# Patient Record
Sex: Male | Born: 1960 | Race: White | Hispanic: No | Marital: Married | Smoking: Current every day smoker
Health system: Southern US, Community
[De-identification: ages and names within clinical notes are randomized; demographics above are authoritative.]

## PROBLEM LIST (undated history)

## (undated) DIAGNOSIS — K5792 Diverticulitis of intestine, part unspecified, without perforation or abscess without bleeding: Secondary | ICD-10-CM

## (undated) DIAGNOSIS — K219 Gastro-esophageal reflux disease without esophagitis: Secondary | ICD-10-CM

## (undated) DIAGNOSIS — F17210 Nicotine dependence, cigarettes, uncomplicated: Secondary | ICD-10-CM

## (undated) DIAGNOSIS — J439 Emphysema, unspecified: Secondary | ICD-10-CM

## (undated) DIAGNOSIS — Z9889 Other specified postprocedural states: Secondary | ICD-10-CM

## (undated) DIAGNOSIS — N289 Disorder of kidney and ureter, unspecified: Secondary | ICD-10-CM

## (undated) DIAGNOSIS — M519 Unspecified thoracic, thoracolumbar and lumbosacral intervertebral disc disorder: Secondary | ICD-10-CM

## (undated) DIAGNOSIS — Z87442 Personal history of urinary calculi: Secondary | ICD-10-CM

## (undated) DIAGNOSIS — Z8719 Personal history of other diseases of the digestive system: Secondary | ICD-10-CM

## (undated) DIAGNOSIS — J189 Pneumonia, unspecified organism: Secondary | ICD-10-CM

## (undated) DIAGNOSIS — T4145XA Adverse effect of unspecified anesthetic, initial encounter: Secondary | ICD-10-CM

## (undated) DIAGNOSIS — R1031 Right lower quadrant pain: Secondary | ICD-10-CM

## (undated) DIAGNOSIS — I700 Atherosclerosis of aorta: Secondary | ICD-10-CM

## (undated) DIAGNOSIS — E041 Nontoxic single thyroid nodule: Secondary | ICD-10-CM

## (undated) DIAGNOSIS — R519 Headache, unspecified: Secondary | ICD-10-CM

## (undated) DIAGNOSIS — G8929 Other chronic pain: Secondary | ICD-10-CM

## (undated) DIAGNOSIS — M51369 Other intervertebral disc degeneration, lumbar region without mention of lumbar back pain or lower extremity pain: Secondary | ICD-10-CM

## (undated) DIAGNOSIS — S199XXA Unspecified injury of neck, initial encounter: Secondary | ICD-10-CM

## (undated) DIAGNOSIS — T8859XA Other complications of anesthesia, initial encounter: Secondary | ICD-10-CM

## (undated) DIAGNOSIS — R51 Headache: Secondary | ICD-10-CM

## (undated) DIAGNOSIS — R112 Nausea with vomiting, unspecified: Secondary | ICD-10-CM

## (undated) HISTORY — DX: Unspecified injury of neck, initial encounter: S19.9XXA

## (undated) HISTORY — DX: Personal history of other diseases of the digestive system: Z87.19

## (undated) HISTORY — PX: WISDOM TOOTH EXTRACTION: SHX21

## (undated) HISTORY — PX: VASECTOMY: SHX75

## (undated) HISTORY — PX: INGUINAL HERNIA REPAIR: SUR1180

## (undated) HISTORY — PX: CERVICAL SPINE SURGERY: SHX589

## (undated) HISTORY — PX: TONSILLECTOMY: SUR1361

## (undated) HISTORY — PX: COLONOSCOPY: SHX174

---

## 1989-10-31 DIAGNOSIS — S199XXA Unspecified injury of neck, initial encounter: Secondary | ICD-10-CM

## 1989-10-31 HISTORY — DX: Unspecified injury of neck, initial encounter: S19.9XXA

## 1998-04-09 ENCOUNTER — Encounter: Admission: RE | Admit: 1998-04-09 | Discharge: 1998-07-08 | Payer: Self-pay | Admitting: Anesthesiology

## 1998-07-03 ENCOUNTER — Ambulatory Visit (HOSPITAL_COMMUNITY): Admission: RE | Admit: 1998-07-03 | Discharge: 1998-07-03 | Payer: Self-pay

## 1998-12-04 ENCOUNTER — Ambulatory Visit (HOSPITAL_COMMUNITY): Admission: RE | Admit: 1998-12-04 | Discharge: 1998-12-04 | Payer: Self-pay | Admitting: Gastroenterology

## 1998-12-07 ENCOUNTER — Ambulatory Visit (HOSPITAL_COMMUNITY): Admission: RE | Admit: 1998-12-07 | Discharge: 1998-12-07 | Payer: Self-pay | Admitting: Gastroenterology

## 1998-12-07 ENCOUNTER — Encounter: Payer: Self-pay | Admitting: Gastroenterology

## 1999-01-02 ENCOUNTER — Emergency Department (HOSPITAL_COMMUNITY): Admission: EM | Admit: 1999-01-02 | Discharge: 1999-01-02 | Payer: Self-pay

## 1999-11-07 ENCOUNTER — Emergency Department (HOSPITAL_COMMUNITY): Admission: EM | Admit: 1999-11-07 | Discharge: 1999-11-07 | Payer: Self-pay | Admitting: Emergency Medicine

## 2000-01-29 ENCOUNTER — Emergency Department (HOSPITAL_COMMUNITY): Admission: EM | Admit: 2000-01-29 | Discharge: 2000-01-30 | Payer: Self-pay | Admitting: Emergency Medicine

## 2000-01-30 ENCOUNTER — Encounter: Payer: Self-pay | Admitting: Emergency Medicine

## 2000-09-08 ENCOUNTER — Emergency Department (HOSPITAL_COMMUNITY): Admission: EM | Admit: 2000-09-08 | Discharge: 2000-09-08 | Payer: Self-pay | Admitting: Emergency Medicine

## 2000-09-08 ENCOUNTER — Encounter: Payer: Self-pay | Admitting: Emergency Medicine

## 2000-09-11 ENCOUNTER — Encounter: Admission: RE | Admit: 2000-09-11 | Discharge: 2000-09-11 | Payer: Self-pay | Admitting: Family Medicine

## 2000-09-11 ENCOUNTER — Encounter: Payer: Self-pay | Admitting: Family Medicine

## 2000-10-26 ENCOUNTER — Encounter: Payer: Self-pay | Admitting: Neurosurgery

## 2000-10-26 ENCOUNTER — Ambulatory Visit (HOSPITAL_COMMUNITY): Admission: RE | Admit: 2000-10-26 | Discharge: 2000-10-26 | Payer: Self-pay | Admitting: Neurosurgery

## 2003-01-30 ENCOUNTER — Emergency Department (HOSPITAL_COMMUNITY): Admission: EM | Admit: 2003-01-30 | Discharge: 2003-01-30 | Payer: Self-pay

## 2003-06-12 ENCOUNTER — Encounter: Payer: Self-pay | Admitting: Emergency Medicine

## 2003-06-12 ENCOUNTER — Emergency Department (HOSPITAL_COMMUNITY): Admission: EM | Admit: 2003-06-12 | Discharge: 2003-06-12 | Payer: Self-pay | Admitting: Emergency Medicine

## 2003-08-04 ENCOUNTER — Emergency Department (HOSPITAL_COMMUNITY): Admission: EM | Admit: 2003-08-04 | Discharge: 2003-08-04 | Payer: Self-pay | Admitting: Emergency Medicine

## 2004-03-16 ENCOUNTER — Emergency Department (HOSPITAL_COMMUNITY): Admission: EM | Admit: 2004-03-16 | Discharge: 2004-03-16 | Payer: Self-pay | Admitting: Emergency Medicine

## 2004-08-14 ENCOUNTER — Emergency Department: Payer: Self-pay | Admitting: Emergency Medicine

## 2004-08-17 ENCOUNTER — Emergency Department: Payer: Self-pay | Admitting: Emergency Medicine

## 2004-08-27 ENCOUNTER — Emergency Department: Payer: Self-pay | Admitting: Unknown Physician Specialty

## 2005-01-18 ENCOUNTER — Ambulatory Visit: Payer: Self-pay | Admitting: Gastroenterology

## 2005-02-28 ENCOUNTER — Ambulatory Visit: Payer: Self-pay | Admitting: Gastroenterology

## 2005-05-08 ENCOUNTER — Emergency Department: Payer: Self-pay | Admitting: Emergency Medicine

## 2005-12-11 ENCOUNTER — Emergency Department (HOSPITAL_COMMUNITY): Admission: EM | Admit: 2005-12-11 | Discharge: 2005-12-11 | Payer: Self-pay | Admitting: Emergency Medicine

## 2005-12-18 ENCOUNTER — Observation Stay (HOSPITAL_COMMUNITY): Admission: EM | Admit: 2005-12-18 | Discharge: 2005-12-19 | Payer: Self-pay | Admitting: *Deleted

## 2005-12-19 HISTORY — PX: CARDIAC CATHETERIZATION: SHX172

## 2005-12-21 ENCOUNTER — Ambulatory Visit (HOSPITAL_COMMUNITY): Admission: RE | Admit: 2005-12-21 | Discharge: 2005-12-21 | Payer: Self-pay | Admitting: Cardiology

## 2005-12-22 ENCOUNTER — Ambulatory Visit (HOSPITAL_COMMUNITY): Admission: RE | Admit: 2005-12-22 | Discharge: 2005-12-22 | Payer: Self-pay | Admitting: Cardiology

## 2005-12-24 ENCOUNTER — Emergency Department (HOSPITAL_COMMUNITY): Admission: EM | Admit: 2005-12-24 | Discharge: 2005-12-24 | Payer: Self-pay | Admitting: Emergency Medicine

## 2005-12-28 ENCOUNTER — Encounter: Admission: RE | Admit: 2005-12-28 | Discharge: 2005-12-28 | Payer: Self-pay | Admitting: Cardiology

## 2007-05-16 ENCOUNTER — Emergency Department (HOSPITAL_COMMUNITY): Admission: EM | Admit: 2007-05-16 | Discharge: 2007-05-16 | Payer: Self-pay | Admitting: Emergency Medicine

## 2009-09-07 ENCOUNTER — Encounter: Admission: RE | Admit: 2009-09-07 | Discharge: 2009-09-07 | Payer: Self-pay | Admitting: Pain Medicine

## 2010-01-05 ENCOUNTER — Encounter: Payer: Self-pay | Admitting: Family Medicine

## 2010-02-02 ENCOUNTER — Ambulatory Visit: Payer: Self-pay | Admitting: Family Medicine

## 2010-02-02 DIAGNOSIS — R109 Unspecified abdominal pain: Secondary | ICD-10-CM

## 2010-02-02 DIAGNOSIS — N200 Calculus of kidney: Secondary | ICD-10-CM | POA: Insufficient documentation

## 2010-02-02 DIAGNOSIS — M542 Cervicalgia: Secondary | ICD-10-CM | POA: Insufficient documentation

## 2010-02-02 DIAGNOSIS — R1031 Right lower quadrant pain: Secondary | ICD-10-CM

## 2010-02-16 ENCOUNTER — Encounter: Payer: Self-pay | Admitting: Family Medicine

## 2010-03-30 HISTORY — PX: INGUINAL HERNIA REPAIR: SUR1180

## 2010-04-20 ENCOUNTER — Encounter: Payer: Self-pay | Admitting: Family Medicine

## 2010-11-21 ENCOUNTER — Encounter: Payer: Self-pay | Admitting: Pain Medicine

## 2010-11-30 NOTE — Letter (Signed)
Summary: Patient Questionnaire  Patient Questionnaire   Imported By: Beau Fanny 01/05/2010 15:57:08  _____________________________________________________________________  External Attachment:    Type:   Image     Comment:   External Document  Appended Document: Patient Questionnaire I have not evaluated this patient yet.

## 2010-11-30 NOTE — Consult Note (Signed)
Summary: [facility]   Imported By: Lanelle Bal 03/09/2010 10:57:43  _____________________________________________________________________  External Attachment:    Type:   Image     Comment:   External Document
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
## 2010-11-30 NOTE — Letter (Signed)
Summary: [facility]   Imported By: Lanelle Bal 05/05/2010 11:57:49  _____________________________________________________________________  External Attachment:    Type:   Image     Comment:   External Document
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
## 2011-03-18 NOTE — Cardiovascular Report (Signed)
NAMEDONNIE, [name] NO.:  [id number]   MEDICAL RECORD NO.:  [id number]          PATIENT TYPE:  INP   LOCATION:  3701                         FACILITY:  [facility]   PHYSICIAN:  Colleen Can. Deborah Chalk, M.D.DATE OF BIRTH:  [birth year]   DATE OF PROCEDURE:  12/19/2005  DATE OF DISCHARGE:  12/19/2005                              CARDIAC CATHETERIZATION   PROCEDURES:  Left heart catheterization with selective coronary angiography,  left ventricular angiography and aortic root angiography.   TYPE AND SITE OF ENTRY:  Percutaneous right femoral artery.   CATHETER:  6-French #4 curved Judkins right and left coronary catheter, 6-  French pigtail ventriculographic catheter, 3.5 right coronary artery  catheter.   CONTRAST:  Pure Omnipaque.   MEDICATIONS:  Given prior procedure, Valium 10 milligrams. Medication given  during the procedure, Versed 2 milligrams IV.   COMMENTS:  The patient tolerated the procedure well. There were anomalous  coronary arteries.   HEMODYNAMIC DATA:  The aortic pressure was 90/64, LV was 87/1-3. There is no  aortic valve gradient noted on pullback.   ANGIOGRAPHIC DATA:  The coronary arteries arise in an anomalous  distribution. The left circumflex is large and arises off of the right  coronary artery.   1.  Left anterior descending: Left anterior descending has irregularities      proximally. Otherwise it is normal. It extends to and across the apex.  2.  Right coronary artery: The right coronary artery is a moderate-sized. It      has minor irregularities but no obstructive disease. It is a small      posterior descending vessel.  3.  Left circumflex: Left circumflex arises an anomalous distribution off of      the right coronary artery. It appears to go posterior to the aorta as      one might expect. It is a large vessel with a large continuation branch      and a large obtuse marginal that trifurcates and extends toward the      apex. There  are minor irregularities in the proximal portion.   LEFT VENTRICULAR ANGIOGRAM:  Left ventricular angiograms performed in the  RAO position. Overall cardiac size and silhouette are normal. Global  ejection fraction is 60%.   The aortic root was performed in the RAO axial projection. This demonstrates  what appears to be a posterior distribution of the anomalous left  circumflex. However, it is somewhat difficult in these two-dimensional  planes.   OVERALL IMPRESSION:  1.  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  2.  Mild coronary atherosclerosis.  3.  Normal left ventricular function.   DISCUSSION:  It is felt that there is not enough coronary atherosclerosis to  explain [name] chest pain syndrome. There is an anomalous origin of the  left circumflex but once again that would not explain chest pain syndrome.  We will need to further evaluate with a CT scan and this also will be  helpful to identify the course and location of this anomalous left  circumflex.      Bear Stearns  Roslynn Amble, M.D.  Electronically Signed     SNT/MEDQ  D:  12/19/2005  T:  12/19/2005  Job:  [id number]   cc:   Deirdre Peer. Polite, M.D.
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
## 2011-03-21 ENCOUNTER — Telehealth: Payer: Self-pay | Admitting: *Deleted
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
## 2011-05-09 ENCOUNTER — Telehealth: Payer: Self-pay | Admitting: *Deleted

## 2011-05-09 NOTE — Telephone Encounter (Signed)
Pt was stung on his arm by a bee around 10:00 this morning- wife had told me it happened about an hour ago.  Arm is red and swollen, with a red streak moving up his arm.  No problems with breathing, no wheezing.  He took one benedryl about 10:30.  Per Dr. Dayton Martes advised pt he can take another benedryl or something less sedating like zyrtec or claritin.  Advised ice to area, which might help with the swelling.  Offered office visit tomorrow if he's not any better.

## 2011-05-13 NOTE — Telephone Encounter (Signed)
Agreed -

## 2011-05-13 NOTE — Telephone Encounter (Signed)
Dr. Dayton Martes, did you see this note?

## 2011-07-27 ENCOUNTER — Emergency Department (HOSPITAL_COMMUNITY): Payer: 59

## 2011-07-27 ENCOUNTER — Emergency Department (HOSPITAL_COMMUNITY)
Admission: EM | Admit: 2011-07-27 | Discharge: 2011-07-27 | Disposition: A | Discharge status: Home / Self Care | Payer: 59 | Attending: Emergency Medicine | Admitting: Emergency Medicine

## 2011-07-27 DIAGNOSIS — Z79899 Other long term (current) drug therapy: Secondary | ICD-10-CM | POA: Insufficient documentation

## 2011-07-27 DIAGNOSIS — R109 Unspecified abdominal pain: Secondary | ICD-10-CM | POA: Insufficient documentation

## 2011-07-27 DIAGNOSIS — IMO0002 Reserved for concepts with insufficient information to code with codable children: Secondary | ICD-10-CM | POA: Insufficient documentation

## 2011-07-27 DIAGNOSIS — S20219A Contusion of unspecified front wall of thorax, initial encounter: Secondary | ICD-10-CM | POA: Insufficient documentation

## 2011-07-27 DIAGNOSIS — Y998 Other external cause status: Secondary | ICD-10-CM | POA: Insufficient documentation

## 2011-07-27 LAB — CBC
HCT: 42.8 % (ref 39.0–52.0)
MCH: 33.4 pg (ref 26.0–34.0)
MCV: 95.3 fL (ref 78.0–100.0)
Platelets: 216 10*3/uL (ref 150–400)
RBC: 4.49 MIL/uL (ref 4.22–5.81)
WBC: 8.8 10*3/uL (ref 4.0–10.5)

## 2011-07-27 LAB — BASIC METABOLIC PANEL
CO2: 26 mEq/L (ref 19–32)
Calcium: 8.9 mg/dL (ref 8.4–10.5)
Creatinine, Ser: 0.7 mg/dL (ref 0.50–1.35)
GFR calc Af Amer: >60 mL/min (ref >60)
Potassium: 3.8 mEq/L (ref 3.5–5.1)
Sodium: 136 mEq/L (ref 135–145)

## 2011-07-27 MED ORDER — IOHEXOL 300 MG/ML  SOLN
100.0000 mL | Freq: Once | INTRAMUSCULAR | Status: AC | PRN
  Administered 2011-07-27: 100 mL via INTRAVENOUS

## 2011-08-15 LAB — COMPREHENSIVE METABOLIC PANEL
ALT: 14
Albumin: 3.7
Alkaline Phosphatase: 69
Calcium: 8.8
Creatinine, Ser: 0.88
Glucose, Bld: 110 — ABNORMAL HIGH
Total Protein: 6.5

## 2011-08-15 LAB — CBC
HCT: 43.3
Hemoglobin: 15.3
MCV: 95.4
RDW: 13

## 2011-08-15 LAB — DIFFERENTIAL
Basophils Absolute: 0.1
Basophils Relative: 1
Eosinophils Absolute: 0.3
Lymphs Abs: 3.5 — ABNORMAL HIGH
Monocytes Relative: 8
Neutro Abs: 4.8
Neutrophils Relative %: 51

## 2011-08-15 LAB — LIPASE, BLOOD: Lipase: 23

## 2011-11-07 ENCOUNTER — Encounter: Payer: Self-pay | Admitting: Family Medicine

## 2011-11-07 ENCOUNTER — Ambulatory Visit (INDEPENDENT_AMBULATORY_CARE_PROVIDER_SITE_OTHER): Payer: 59 | Admitting: Family Medicine

## 2011-11-07 VITALS — BP 116/74 | HR 72 | Temp 98.2°F | Ht 72.0 in | Wt 175.2 lb

## 2011-11-07 DIAGNOSIS — J329 Chronic sinusitis, unspecified: Secondary | ICD-10-CM

## 2011-11-07 MED ORDER — AMOXICILLIN-POT CLAVULANATE 875-125 MG PO TABS: 1.0000 | ORAL_TABLET | Freq: Two times a day (BID) | ORAL | Status: AC

## 2011-11-07 NOTE — Assessment & Plan Note (Signed)
Given duration and progression, treat with augmentin. Update [place] if not improving as expected.

## 2011-11-07 NOTE — Progress Notes (Signed)
  Subjective:    Patient ID: [name], male    DOB: 1961-04-17, 51 y.o.   MRN: [id number]  HPI CC: ?sinusitis  sxs started 2 wks before christmas.  Feeling bad for several days, then started feeling better, then worse again, then better, now recently deteriorated.  Bilateral ear pain, headaches worse with bending over, stuffy nose.  Nasal congestion.    So far has tried tylenol sinus OTC med which helps, but then sxs return.  No fevers/chills, coughing, abde pain, n/v, ST, tooth pain.  Smoking 1 ppd.  No sick contacts at home.  No asthma, COPD  Review of Systems Per HPI    Objective:   Physical Exam  Nursing note and vitals reviewed. Constitutional: He appears well-developed and well-nourished. No distress.  HENT:  Head: Normocephalic and atraumatic.  Right Ear: Hearing, tympanic membrane, external ear and ear canal normal.  Left Ear: Hearing, tympanic membrane, external ear and ear canal normal.  Nose: No mucosal edema or rhinorrhea. Right sinus exhibits maxillary sinus tenderness. Right sinus exhibits no frontal sinus tenderness. Left sinus exhibits maxillary sinus tenderness. Left sinus exhibits no frontal sinus tenderness.  Mouth/Throat: Uvula is midline, oropharynx is clear and moist and mucous membranes are normal. No oropharyngeal exudate, posterior oropharyngeal edema, posterior oropharyngeal erythema or tonsillar abscesses.       TMs congested bilaterally  Eyes: Conjunctivae and EOM are normal. Pupils are equal, round, and reactive to light. No scleral icterus.  Neck: Normal range of motion. Neck supple.  Cardiovascular: Normal rate, regular rhythm, normal heart sounds and intact distal pulses.   No murmur heard. Pulmonary/Chest: Effort normal and breath sounds normal. No respiratory distress. He has no wheezes. He has no rales.  Lymphadenopathy:    He has no cervical adenopathy.  Skin: Skin is warm and dry. No rash noted.       Assessment & Plan:

## 2011-11-07 NOTE — Patient Instructions (Signed)
You have a sinus infection. Take medicine as prescribed: augmentin twice daily for 10 days Push fluids and plenty of rest. Nasal saline irrigation or neti pot to help drain sinuses. Zajkowski use simple mucinex with plenty of fluid to help mobilize mucous. Let us know if fever >101.5, trouble opening/closing mouth, difficulty swallowing, or worsening - you Coen need to be seen again. 

## 2011-11-23 ENCOUNTER — Encounter (INDEPENDENT_AMBULATORY_CARE_PROVIDER_SITE_OTHER): Payer: Self-pay | Admitting: Surgery

## 2011-11-23 ENCOUNTER — Ambulatory Visit (INDEPENDENT_AMBULATORY_CARE_PROVIDER_SITE_OTHER): Payer: 59 | Admitting: Surgery

## 2011-11-23 VITALS — BP 117/76 | HR 82 | Temp 97.5°F | Resp 22 | Ht 72.0 in | Wt 168.8 lb

## 2011-11-23 DIAGNOSIS — R1031 Right lower quadrant pain: Secondary | ICD-10-CM
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
## 2011-12-12 ENCOUNTER — Ambulatory Visit (INDEPENDENT_AMBULATORY_CARE_PROVIDER_SITE_OTHER): Payer: 59 | Admitting: Surgery

## 2011-12-12 ENCOUNTER — Encounter (INDEPENDENT_AMBULATORY_CARE_PROVIDER_SITE_OTHER): Payer: Self-pay | Admitting: Surgery

## 2011-12-12 VITALS — BP 118/80 | HR 68 | Temp 98.1°F | Resp 18 | Ht 72.0 in | Wt 171.0 lb

## 2011-12-12 DIAGNOSIS — R109 Unspecified abdominal pain: Secondary | ICD-10-CM

## 2011-12-12 DIAGNOSIS — G8929 Other chronic pain: Secondary | ICD-10-CM

## 2011-12-12 NOTE — Progress Notes (Signed)
Subjective:     Patient ID: [name], male   DOB: September 18, 1961, 51 y.o.   MRN: [id number]  HPI He reports that he was unable to take the Neurontin as a cause him to have an upset stomach. He is virtually pain-free when he is an active and puts a heating pad on the incision. He described as sharp pain along his incision which is intermittent. It is worse with activities.  Review of Systems     Objective:   Physical Exam Again, on exam his incision is totally healed and there is no evidence of recurrent hernia. I chose to perform I nerve block with Marcaine and lidocaine to see if this will help him.    Assessment:     Patient with chronic right groin pain    Plan:     Unfortunately, this Polack be a chronic problem for him. Because this came so far after surgery, it Anspach represent chronic nerve entrapment from chronic fibrosis or scar tissue. I will see him back in 2 weeks to see if the injection helped

## 2011-12-20 ENCOUNTER — Other Ambulatory Visit (INDEPENDENT_AMBULATORY_CARE_PROVIDER_SITE_OTHER): Payer: Self-pay | Admitting: Surgery

## 2011-12-20 ENCOUNTER — Encounter (INDEPENDENT_AMBULATORY_CARE_PROVIDER_SITE_OTHER): Payer: Self-pay | Admitting: Surgery

## 2011-12-20 NOTE — Telephone Encounter (Signed)
Faxed back ok for refill on Hydrocodone /apap [phone] faxed back to [facility]

## 2012-01-03 ENCOUNTER — Encounter (INDEPENDENT_AMBULATORY_CARE_PROVIDER_SITE_OTHER): Payer: 59 | Admitting: Surgery

## 2012-01-10 ENCOUNTER — Encounter (INDEPENDENT_AMBULATORY_CARE_PROVIDER_SITE_OTHER): Payer: 59 | Admitting: Surgery

## 2012-02-07 ENCOUNTER — Encounter (INDEPENDENT_AMBULATORY_CARE_PROVIDER_SITE_OTHER): Payer: Self-pay | Admitting: Surgery

## 2012-02-07 ENCOUNTER — Ambulatory Visit (INDEPENDENT_AMBULATORY_CARE_PROVIDER_SITE_OTHER): Payer: 59 | Admitting: Surgery

## 2012-02-07 VITALS — BP 124/86 | HR 100 | Temp 99.0°F | Resp 16 | Ht 72.0 in | Wt 171.8 lb

## 2012-02-07 DIAGNOSIS — R109 Unspecified abdominal pain: Secondary | ICD-10-CM

## 2012-02-07 DIAGNOSIS — G8929 Other chronic pain: Secondary | ICD-10-CM

## 2012-02-07 NOTE — Progress Notes (Signed)
Subjective:     Patient ID: [name], male   DOB: 05/11/1961, 51 y.o.   MRN: [id number]  HPI He continues to have intermittent superficial pain in the right groin. The last time I saw him I injected the area but this did not help. He has seen his urologist he made no suggestions. He has tried Lyrica and Neurontin without help. He can sometimes to several days without pain.  Review of Systems     Objective:   Physical Exam On exam, the incision is soft. There is no swelling, no sign of infection, no sign of recurrent hernia    Assessment:     Patient with chronic superficial incisional pain. This has not seen much entrapment of a deep nerve as he has no pain on the scrotum or thigh or testicle. Again I believe this is related to scar tissue.    Plan:     We are going to switch to Percocet from hydrocodone. I will see him back in 6 months unless something changes

## 2012-02-23 ENCOUNTER — Ambulatory Visit: Payer: Self-pay | Admitting: Internal Medicine

## 2012-02-24 ENCOUNTER — Telehealth (INDEPENDENT_AMBULATORY_CARE_PROVIDER_SITE_OTHER): Payer: Self-pay | Admitting: General Surgery

## 2012-02-24 NOTE — Telephone Encounter (Signed)
Called pt refill of Hydrocodone 5/325 every 6 hours for pain with no refills. I paged Dr Magnus Ivan and he ok it

## 2012-03-15 ENCOUNTER — Emergency Department: Payer: Self-pay | Admitting: Emergency Medicine

## 2012-03-15 LAB — CBC
HCT: 41 % (ref 40.0–52.0)
HGB: 14 g/dL (ref 13.0–18.0)
MCH: 34.3 pg — ABNORMAL HIGH (ref 26.0–34.0)
MCV: 101 fL — ABNORMAL HIGH (ref 80–100)
Platelet: 186 10*3/uL (ref 150–440)
RBC: 4.08 10*6/uL — ABNORMAL LOW (ref 4.40–5.90)
RDW: 13.3 % (ref 11.5–14.5)
WBC: 7.1 10*3/uL (ref 3.8–10.6)

## 2012-03-15 LAB — URINALYSIS, COMPLETE
Bilirubin,UR: NEGATIVE
Glucose,UR: NEGATIVE mg/dL (ref 0–75)
Leukocyte Esterase: NEGATIVE
Nitrite: NEGATIVE
Ph: 6 (ref 4.5–8.0)
Protein: NEGATIVE
Specific Gravity: 1.027 (ref 1.003–1.030)
WBC UR: <1 /HPF (ref 0–5)

## 2012-03-15 LAB — TROPONIN I
Troponin-I: <0.02 ng/mL
Troponin-I: <0.02 ng/mL

## 2012-03-15 LAB — COMPREHENSIVE METABOLIC PANEL
Anion Gap: 6 — ABNORMAL LOW (ref 7–16)
Bilirubin,Total: 0.4 mg/dL (ref 0.2–1.0)
Calcium, Total: 8.4 mg/dL — ABNORMAL LOW (ref 8.5–10.1)
Chloride: 111 mmol/L — ABNORMAL HIGH (ref 98–107)
Co2: 25 mmol/L (ref 21–32)
EGFR (African American): >60
EGFR (Non-African Amer.): >60
Osmolality: 285 (ref 275–301)
Total Protein: 6.8 g/dL (ref 6.4–8.2)

## 2012-03-15 LAB — CK TOTAL AND CKMB (NOT AT ARMC)
CK, Total: 103 U/L (ref 35–232)
CK-MB: 1.1 ng/mL (ref 0.5–3.6)

## 2012-05-23 ENCOUNTER — Ambulatory Visit: Payer: Self-pay | Admitting: Gastroenterology

## 2012-05-25 LAB — PATHOLOGY REPORT

## 2012-07-17 ENCOUNTER — Encounter (INDEPENDENT_AMBULATORY_CARE_PROVIDER_SITE_OTHER): Payer: Self-pay | Admitting: Surgery

## 2012-08-03 ENCOUNTER — Ambulatory Visit (INDEPENDENT_AMBULATORY_CARE_PROVIDER_SITE_OTHER): Payer: 59 | Admitting: Surgery

## 2012-08-03 ENCOUNTER — Encounter (INDEPENDENT_AMBULATORY_CARE_PROVIDER_SITE_OTHER): Payer: Self-pay | Admitting: Surgery

## 2012-08-03 VITALS — BP 110/78 | HR 76 | Temp 98.1°F | Resp 14 | Ht 72.0 in | Wt 185.6 lb

## 2012-08-03 DIAGNOSIS — G8929 Other chronic pain: Secondary | ICD-10-CM

## 2012-08-03 DIAGNOSIS — R109 Unspecified abdominal pain: Secondary | ICD-10-CM

## 2012-08-03 NOTE — Progress Notes (Signed)
Subjective:     Patient ID: [name], male   DOB: Dec 05, 1960, 51 y.o.   MRN: [id number]  HPI He is back today for a six-month recheck. He is again started having worsening of his right groin pain especially after he did some heavy lifting last week. He reports the pain as sharp and severe and is above the inguinal incision on the right side.  Review of Systems     Objective:   Physical Exam    On exam, he is well healed and there is no inguinal hernia present. He is tender superior and medial to this incision Assessment:     Chronic groin pain.    Plan:     It is difficult to tell whether this is nerve entrapment or he [place] be malingering. He is interested potentially in repeat exploration of the area.  I want him for Vicodin and see him back in one month to talk about further

## 2012-09-03 ENCOUNTER — Encounter (INDEPENDENT_AMBULATORY_CARE_PROVIDER_SITE_OTHER): Payer: 59 | Admitting: Surgery

## 2012-10-31 HISTORY — PX: FRACTURE SURGERY: SHX138

## 2012-11-05 ENCOUNTER — Ambulatory Visit (INDEPENDENT_AMBULATORY_CARE_PROVIDER_SITE_OTHER): Payer: Medicare Other | Admitting: Surgery

## 2012-11-05 ENCOUNTER — Encounter (INDEPENDENT_AMBULATORY_CARE_PROVIDER_SITE_OTHER): Payer: Self-pay | Admitting: Surgery

## 2012-11-05 VITALS — BP 122/64 | HR 112 | Temp 98.4°F | Resp 16 | Ht 72.0 in | Wt 188.0 lb

## 2012-11-05 DIAGNOSIS — R1031 Right lower quadrant pain: Secondary | ICD-10-CM

## 2012-11-05 NOTE — Progress Notes (Signed)
Subjective:     Patient ID: [name], male   DOB: 1961-10-19, 52 y.o.   MRN: [id number]  HPI He continues to have right chronic groin pain. He reports hurts to walk which is inconsistent with ilioinguinal nerve pain. He is on permanent disability from neck pain. He has had multiple surgeries for that without relief as well.  Review of Systems     Objective:   Physical Exam On exam, his incision is well-healed. There is tenderness along the incision. This seems to be gone when he is distracted. I performed an ilioinguinal nerve block with Marcaine and lidocaine. This did not appear to help him.    Assessment:     Chronic right groin pain.    Plan:     I will remove his Vicodin. I will see him back in one month. Again we will readdress groin exploration

## 2012-12-04 ENCOUNTER — Encounter (INDEPENDENT_AMBULATORY_CARE_PROVIDER_SITE_OTHER): Payer: Medicare Other | Admitting: Surgery

## 2012-12-31 ENCOUNTER — Encounter (INDEPENDENT_AMBULATORY_CARE_PROVIDER_SITE_OTHER): Payer: Self-pay | Admitting: Surgery

## 2012-12-31 ENCOUNTER — Ambulatory Visit (INDEPENDENT_AMBULATORY_CARE_PROVIDER_SITE_OTHER): Payer: Medicare Other | Admitting: Surgery

## 2012-12-31 VITALS — BP 138/88 | HR 116 | Temp 97.8°F | Resp 18 | Ht 72.0 in | Wt 186.4 lb

## 2012-12-31 DIAGNOSIS — IMO0001 Reserved for inherently not codable concepts without codable children: Secondary | ICD-10-CM

## 2012-12-31 DIAGNOSIS — R1031 Right lower quadrant pain: Secondary | ICD-10-CM

## 2012-12-31 NOTE — Progress Notes (Signed)
Subjective:     Patient ID: [name], male   DOB: June 12, 1961, 52 y.o.   MRN: [id number]  HPI He is here for another visit regarding his chronic right groin pain. When I saw him in January, I performed an ilioinguinal nerve block with Marcaine and lidocaine. He reports that he was pain-free for almost a month and a half. He is now having recurrent discomfort.  Review of Systems     Objective:   Physical Exam On exam, there is no evidence of recurrent hernia. I again performed an ilioinguinal nerve block with Marcaine and lidocaine.    Assessment:     Chronic groin pain.     Plan:     Again, we discussed continued blocks first groin exploration. Because of family issues, he would like to continue with the nerve blocks. I renewed his hydrocodone. I will see him back in 2 months

## 2013-02-04 ENCOUNTER — Ambulatory Visit: Payer: Self-pay | Admitting: Internal Medicine

## 2013-02-13 ENCOUNTER — Telehealth (INDEPENDENT_AMBULATORY_CARE_PROVIDER_SITE_OTHER): Payer: Self-pay | Admitting: *Deleted

## 2013-02-13 NOTE — Telephone Encounter (Signed)
Patient called to state that the nerve block is already wearing off and is requesting an appt to get another nerve block.  Appt made for 4/22.  Patient agreeable at this time.

## 2013-02-19 ENCOUNTER — Ambulatory Visit (INDEPENDENT_AMBULATORY_CARE_PROVIDER_SITE_OTHER): Payer: Medicare Other | Admitting: Surgery

## 2013-02-19 ENCOUNTER — Encounter (INDEPENDENT_AMBULATORY_CARE_PROVIDER_SITE_OTHER): Payer: Self-pay | Admitting: Surgery

## 2013-02-19 VITALS — BP 138/86 | HR 91 | Temp 98.5°F | Ht 72.0 in | Wt 183.4 lb

## 2013-02-19 DIAGNOSIS — R109 Unspecified abdominal pain: Secondary | ICD-10-CM

## 2013-02-19 DIAGNOSIS — G8929 Other chronic pain: Secondary | ICD-10-CM

## 2013-02-19 NOTE — Progress Notes (Signed)
Subjective:     Patient ID: [name], male   DOB: 09/28/1961, 52 y.o.   MRN: [id number]  HPI He is here for another recheck. His left groin discomfort persists. He does get relief with the ilioinguinal nerve blocks.  Review of Systems     Objective:   Physical Exam There is no evidence of recurrent hernia. This time I injected a mixture of 1 cc of Kenalog and 9 cc of lidocaine in the right inguinal area    Assessment:     Chronic right groin pain     Plan:     I renewed his hydrocodone. I will see him back in 3 weeks to see if this has helped

## 2013-02-22 ENCOUNTER — Ambulatory Visit: Payer: Self-pay | Admitting: Internal Medicine

## 2013-02-25 ENCOUNTER — Ambulatory Visit: Payer: Self-pay | Admitting: Internal Medicine

## 2013-03-05 ENCOUNTER — Emergency Department: Payer: Self-pay | Admitting: Emergency Medicine

## 2013-03-05 ENCOUNTER — Ambulatory Visit (INDEPENDENT_AMBULATORY_CARE_PROVIDER_SITE_OTHER): Payer: Medicare Other | Admitting: Surgery

## 2013-03-05 ENCOUNTER — Encounter (INDEPENDENT_AMBULATORY_CARE_PROVIDER_SITE_OTHER): Payer: Self-pay | Admitting: Surgery

## 2013-03-05 VITALS — BP 118/82 | HR 84 | Temp 97.8°F | Resp 16 | Ht 72.0 in | Wt 180.0 lb

## 2013-03-05 DIAGNOSIS — R109 Unspecified abdominal pain: Secondary | ICD-10-CM

## 2013-03-05 DIAGNOSIS — G8929 Other chronic pain: Secondary | ICD-10-CM

## 2013-03-05 NOTE — Progress Notes (Signed)
Subjective:     Patient ID: [name], male   DOB: 1961/09/12, 52 y.o.   MRN: [id number]  HPI He is here for another visit. He did not have any relief with the steroid injection.  Review of Systems     Objective:   Physical Exam On exam, he is tender in the right groin. I performed an ilioinguinal nerve block with Marcaine and lidocaine    Assessment:     Chronic groin pain     Plan:     I will see him back in one month

## 2013-03-12 ENCOUNTER — Ambulatory Visit: Payer: Self-pay | Admitting: Specialist

## 2013-03-13 ENCOUNTER — Ambulatory Visit: Payer: Self-pay | Admitting: Specialist

## 2013-04-05 ENCOUNTER — Encounter (INDEPENDENT_AMBULATORY_CARE_PROVIDER_SITE_OTHER): Payer: Self-pay | Admitting: Surgery

## 2013-04-05 ENCOUNTER — Ambulatory Visit (INDEPENDENT_AMBULATORY_CARE_PROVIDER_SITE_OTHER): Payer: Medicare Other | Admitting: Surgery

## 2013-04-05 VITALS — BP 132/78 | HR 88 | Temp 97.1°F | Resp 16 | Ht 72.0 in | Wt 178.8 lb

## 2013-04-05 DIAGNOSIS — R109 Unspecified abdominal pain: Secondary | ICD-10-CM

## 2013-04-05 DIAGNOSIS — G8929 Other chronic pain: Secondary | ICD-10-CM

## 2013-04-05 NOTE — Progress Notes (Signed)
Subjective:     Patient ID: [name], male   DOB: [birth year], 52 y.o.   MRN: [id number]  HPI He is here for another followup visit. Since I saw him last he actually fell and broke his wrist requiring surgical repair. He reports that he got to a half weeks of relief from pain after the ilioinguinal nerve block.  Review of Systems     Objective:   Physical Exam On exam, there is no groin tenderness but no obvious hernia. I again performed a right ilioinguinal nerve block with Marcaine and lidocaine. He tolerated it well    Assessment:     Chronic groin pain     Plan:     Again, he is not interested in surgical intervention at this time. I will continue nerve blocks as needed. I renewed his hydrocodone. I will see him back in approximately a month

## 2013-05-06 ENCOUNTER — Ambulatory Visit (INDEPENDENT_AMBULATORY_CARE_PROVIDER_SITE_OTHER): Payer: Medicare Other | Admitting: Surgery

## 2013-05-06 VITALS — BP 138/86 | HR 97 | Temp 96.6°F | Ht 72.0 in | Wt 187.4 lb

## 2013-05-06 DIAGNOSIS — G8929 Other chronic pain: Secondary | ICD-10-CM

## 2013-05-06 DIAGNOSIS — R109 Unspecified abdominal pain: Secondary | ICD-10-CM

## 2013-05-06 NOTE — Progress Notes (Signed)
Subjective:     Patient ID: [name], male   DOB: 05/28/1961, 52 y.o.   MRN: [id number]  HPI He is here again guarding his chronic right groin pain. The last time I injected is going to get more than a month of pain-free status. He is now having discomfort again  Review of Systems     Objective:   Physical Exam There is no hernia on exam. I performed an ilioinguinal nerve block with 20 cc of quarter percent Marcaine and 1% lidocaine mixture    Assessment:     Chronic groin pain     Plan:     I will review his hydrocodone and see him back in one month

## 2013-05-08 ENCOUNTER — Encounter: Payer: Self-pay | Admitting: Specialist

## 2013-05-31 ENCOUNTER — Encounter: Payer: Self-pay | Admitting: Specialist

## 2013-06-17 ENCOUNTER — Encounter (INDEPENDENT_AMBULATORY_CARE_PROVIDER_SITE_OTHER): Payer: Self-pay | Admitting: Surgery

## 2013-06-17 ENCOUNTER — Ambulatory Visit (INDEPENDENT_AMBULATORY_CARE_PROVIDER_SITE_OTHER): Payer: Medicare Other | Admitting: Surgery

## 2013-06-17 VITALS — BP 130/86 | HR 74 | Resp 16 | Ht 72.0 in | Wt 192.6 lb

## 2013-06-17 DIAGNOSIS — R109 Unspecified abdominal pain: Secondary | ICD-10-CM

## 2013-06-17 DIAGNOSIS — G8929 Other chronic pain: Secondary | ICD-10-CM

## 2013-06-17 MED ORDER — HYDROCODONE-ACETAMINOPHEN 10-325 MG PO TABS: 1.0000 | ORAL_TABLET | Freq: Four times a day (QID) | ORAL | Status: DC | PRN

## 2013-06-17 NOTE — Progress Notes (Signed)
Subjective:     Patient ID: [name], male   DOB: [birth year], 52 y.o.   MRN: [id number]  HPI He is here for another evaluation of his chronic groin pain. After I injected the ilioinguinal nerve the last time. He got about 2 weeks of relief. He still describes an aching pain in the ilioinguinal distribution on the right groin  Review of Systems     Objective:   Physical Exam There are no palpable hernias. He is tender along his old incision. I again performed an ilioinguinal nerve block using 20 cc of a mixture of 1% lidocaine and quarter percent Marcaine    Assessment:     Chronic right groin pain     Plan:     We will continue his chronic pain management. He was to continue to think about the groin reexploration. He has quit smoking

## 2013-07-05 ENCOUNTER — Ambulatory Visit
Admission: RE | Admit: 2013-07-05 | Discharge: 2013-07-05 | Disposition: A | Discharge status: Home / Self Care | Payer: Medicare Other | Source: Ambulatory Visit | Attending: Surgery | Admitting: Surgery

## 2013-07-05 ENCOUNTER — Ambulatory Visit (INDEPENDENT_AMBULATORY_CARE_PROVIDER_SITE_OTHER): Payer: Medicare Other | Admitting: Surgery

## 2013-07-05 ENCOUNTER — Encounter (INDEPENDENT_AMBULATORY_CARE_PROVIDER_SITE_OTHER): Payer: Self-pay | Admitting: Surgery

## 2013-07-05 ENCOUNTER — Other Ambulatory Visit (INDEPENDENT_AMBULATORY_CARE_PROVIDER_SITE_OTHER): Payer: Self-pay | Admitting: Surgery

## 2013-07-05 VITALS — BP 148/90 | HR 100 | Temp 97.9°F | Resp 14 | Ht 72.0 in | Wt 188.0 lb

## 2013-07-05 DIAGNOSIS — R52 Pain, unspecified: Secondary | ICD-10-CM

## 2013-07-05 DIAGNOSIS — K469 Unspecified abdominal hernia without obstruction or gangrene: Secondary | ICD-10-CM

## 2013-07-05 DIAGNOSIS — G8929 Other chronic pain: Secondary | ICD-10-CM

## 2013-07-05 DIAGNOSIS — R1031 Right lower quadrant pain: Secondary | ICD-10-CM

## 2013-07-05 MED ORDER — IOHEXOL 300 MG/ML  SOLN
30.0000 mL | Freq: Once | INTRAMUSCULAR | Status: AC | PRN
  Administered 2013-07-05: 30 mL via ORAL

## 2013-07-05 MED ORDER — IOHEXOL 300 MG/ML  SOLN
100.0000 mL | Freq: Once | INTRAMUSCULAR | Status: AC | PRN
  Administered 2013-07-05: 100 mL via INTRAVENOUS

## 2013-07-05 MED ORDER — OXYCODONE-ACETAMINOPHEN 10-325 MG PO TABS: 1.0000 | ORAL_TABLET | Freq: Four times a day (QID) | ORAL | Status: DC | PRN

## 2013-07-05 NOTE — Progress Notes (Signed)
Subjective:     Patient ID: [name], male   DOB: 1961-08-12, 52 y.o.   MRN: [id number]  HPI This is a patient of mine with chronic right groin pain he was actually here today for something different. He was lifting her grandchild over week ago when he had the sudden onset of sharp pain just lateral to the umbilicus. Since then, he has had sharp. Umbilical discomfort and nausea. He is noticed a swelling area just to the right and inferior to the umbilicus  Review of Systems     Objective:   Physical Exam On exam, he is exquisitely tender along the rectus muscle just below the umbilicus. This area is swollen. There is significant  Guarding just below the umbilicus to the right. I cannot feel a fascial defect    Assessment:     Acute right-sided abdominal pain of uncertain etiology     Plan:     This Howeth represent a rectus hematoma. I cannot rule out a hernia. I believe he needs a stat CAT scan of the abdomen and pelvis to evaluate this area

## 2013-07-23 ENCOUNTER — Ambulatory Visit (INDEPENDENT_AMBULATORY_CARE_PROVIDER_SITE_OTHER): Payer: Medicare Other | Admitting: Surgery

## 2013-07-23 ENCOUNTER — Encounter (INDEPENDENT_AMBULATORY_CARE_PROVIDER_SITE_OTHER): Payer: Self-pay | Admitting: Surgery

## 2013-07-23 VITALS — BP 120/74 | HR 70 | Temp 97.8°F | Resp 16 | Ht 72.0 in | Wt 190.0 lb

## 2013-07-23 DIAGNOSIS — G8929 Other chronic pain: Secondary | ICD-10-CM

## 2013-07-23 DIAGNOSIS — R109 Unspecified abdominal pain: Secondary | ICD-10-CM

## 2013-07-23 MED ORDER — HYDROCODONE-ACETAMINOPHEN 10-325 MG PO TABS: 1.0000 | ORAL_TABLET | Freq: Four times a day (QID) | ORAL | Status: DC | PRN

## 2013-07-23 NOTE — Progress Notes (Signed)
Subjective:     Patient ID: [name], male   DOB: 1961/01/14, 52 y.o.   MRN: [id number]  HPI He is here for a followup visit. He is still having significant muscular discomfort in the right rectus muscle adjacent to the umbilicus. It has improved somewhat. His chronic groin pain is unchanged  Review of Systems     Objective:   Physical Exam On exam, there is pinpoint tenderness in the right rectus muscle. There is slight swelling. There is no hernia  The CAT scan showed no evidence of hernia or rectus sheath hematoma    Assessment:     Strained rectus muscle and chronic groin pain     Plan:     He will continue rest, heat, and ice. I will review his hydrocodone and see him back in one month. I deferred injecting his groin today

## 2013-08-20 ENCOUNTER — Encounter (INDEPENDENT_AMBULATORY_CARE_PROVIDER_SITE_OTHER): Payer: Self-pay | Admitting: Surgery

## 2013-08-20 ENCOUNTER — Ambulatory Visit (INDEPENDENT_AMBULATORY_CARE_PROVIDER_SITE_OTHER): Payer: Medicare Other | Admitting: Surgery

## 2013-08-20 VITALS — BP 130/74 | HR 80 | Temp 98.4°F | Resp 15 | Ht 72.0 in | Wt 187.0 lb

## 2013-08-20 DIAGNOSIS — R1031 Right lower quadrant pain: Secondary | ICD-10-CM

## 2013-08-20 DIAGNOSIS — G8929 Other chronic pain: Secondary | ICD-10-CM

## 2013-08-20 MED ORDER — HYDROCODONE-ACETAMINOPHEN 10-325 MG PO TABS: 1.0000 | ORAL_TABLET | Freq: Four times a day (QID) | ORAL | Status: DC | PRN

## 2013-08-20 NOTE — Progress Notes (Signed)
Subjective:     Patient ID: [name], male   DOB: 03-01-1961, 52 y.o.   MRN: [id number]  HPI He is here for another evaluation of his chronic groin pain. He has noticed a slight intermittent bulge in the groin.  He does get relief with the nerve blocks which lasts several weeks.  Review of Systems     Objective:   Physical Exam On exam, he remains tender along his incision. It feels like a hernia Kneip be developing inferior to the mesh. I again performed an ilioinguinal nerve block on the right side with Marcaine and lidocaine.    Assessment:     Chronic groin pain     Plan:     I want to go ahead and explore his groin  to see if we can relieve his discomfort and to see if a recurrent hernias present. Because of his work situation, he would like to wait until after the holidays to do this. I will see him back in 1 month unless things change

## 2013-09-24 ENCOUNTER — Encounter (INDEPENDENT_AMBULATORY_CARE_PROVIDER_SITE_OTHER): Payer: Self-pay | Admitting: Surgery

## 2013-09-24 ENCOUNTER — Ambulatory Visit (INDEPENDENT_AMBULATORY_CARE_PROVIDER_SITE_OTHER): Payer: Medicare Other | Admitting: Surgery

## 2013-09-24 VITALS — BP 128/80 | HR 68 | Temp 98.6°F | Resp 14 | Ht 72.0 in | Wt 189.6 lb

## 2013-09-24 DIAGNOSIS — R109 Unspecified abdominal pain: Secondary | ICD-10-CM

## 2013-09-24 DIAGNOSIS — G8929 Other chronic pain: Secondary | ICD-10-CM

## 2013-09-24 MED ORDER — HYDROCODONE-ACETAMINOPHEN 10-325 MG PO TABS: 1.0000 | ORAL_TABLET | Freq: Four times a day (QID) | ORAL | Status: DC | PRN

## 2013-09-24 NOTE — Progress Notes (Signed)
Subjective:     Patient ID: [name], male   DOB: 1961/03/03, 52 y.o.   MRN: [id number]  HPI He is here today for another evaluation of the chronic right groin discomfort.  He gets several weeks of relief after ilioinguinal nerve blocks. He again has Sharp groin pain today  Review of Systems     Objective:   Physical Exam On exam, there is no obvious hernia. He is mildly tender in the groin. I again performed a right ilioinguinal nerve block with a mixture of Marcaine and lidocaine    Assessment:     Chronic groin pain on the right side     Plan:     Again, he would like to proceed with exploration of the first of the year. I will see him back again before Christmas and Will renew his hydrocodone today

## 2013-10-17 ENCOUNTER — Ambulatory Visit (INDEPENDENT_AMBULATORY_CARE_PROVIDER_SITE_OTHER): Payer: Medicare Other | Admitting: Surgery

## 2013-10-17 ENCOUNTER — Telehealth (INDEPENDENT_AMBULATORY_CARE_PROVIDER_SITE_OTHER): Payer: Self-pay | Admitting: General Surgery

## 2013-10-17 ENCOUNTER — Encounter (INDEPENDENT_AMBULATORY_CARE_PROVIDER_SITE_OTHER): Payer: Self-pay

## 2013-10-17 VITALS — BP 138/84 | HR 71 | Temp 97.1°F | Resp 16 | Ht 72.0 in | Wt 191.2 lb

## 2013-10-17 DIAGNOSIS — G8929 Other chronic pain: Secondary | ICD-10-CM

## 2013-10-17 DIAGNOSIS — R109 Unspecified abdominal pain: Secondary | ICD-10-CM

## 2013-10-17 MED ORDER — HYDROCODONE-ACETAMINOPHEN 10-325 MG PO TABS: 1.0000 | ORAL_TABLET | Freq: Four times a day (QID) | ORAL | Status: DC | PRN

## 2013-10-17 NOTE — Progress Notes (Signed)
Subjective:     Patient ID: [name], male   DOB: 1960/11/03, 53 y.o.   MRN: [id number]  HPI He reports that he had less leaflet the left ilioinguinal nerve block. He has significant discomfort now  Review of Systems     Objective:   Physical Exam On exam, there is no evidence of recurrent hernia. I again injected a mixture of Marcaine and lidocaine into the right inguinal area    Assessment:     Chronic groin pain     Plan:     He is leaning toward letting me perform a groin exploration in January. I will see him back in January. I will write him for more hydrocodone

## 2013-10-17 NOTE — Telephone Encounter (Signed)
If Mr Marten calls he needs to talk to [place] only, per Dr Magnus Ivan

## 2013-11-11 ENCOUNTER — Encounter (INDEPENDENT_AMBULATORY_CARE_PROVIDER_SITE_OTHER): Payer: Self-pay | Admitting: Surgery

## 2013-11-11 ENCOUNTER — Ambulatory Visit (INDEPENDENT_AMBULATORY_CARE_PROVIDER_SITE_OTHER): Payer: Medicare Other | Admitting: Surgery

## 2013-11-11 ENCOUNTER — Encounter (INDEPENDENT_AMBULATORY_CARE_PROVIDER_SITE_OTHER): Payer: Self-pay

## 2013-11-11 VITALS — BP 127/86 | HR 72 | Temp 98.1°F | Resp 16 | Ht 72.0 in | Wt 193.6 lb

## 2013-11-11 DIAGNOSIS — G8929 Other chronic pain: Secondary | ICD-10-CM

## 2013-11-11 DIAGNOSIS — R109 Unspecified abdominal pain: Secondary | ICD-10-CM

## 2013-11-11 DIAGNOSIS — R1030 Lower abdominal pain, unspecified: Principal | ICD-10-CM

## 2013-11-11 MED ORDER — HYDROCODONE-ACETAMINOPHEN 10-325 MG PO TABS: 1.0000 | ORAL_TABLET | Freq: Four times a day (QID) | ORAL | Status: DC | PRN

## 2013-11-11 NOTE — Progress Notes (Signed)
Subjective:     Patient ID: [name], male   DOB: [birth year], 53 y.o.   MRN: [id number]  HPI He is here for another evaluation of his right chronic groin pain. He got minimal relief with his last nerve block. He has also recently fallen and now has a compression fracture in his spine. He is wearing a brace  Review of Systems     Objective:   Physical Exam On exam, there is no evidence of recurrent hernia on the right groin. I again performed an ilioinguinal nerve block with Marcaine and lidocaine    Assessment:     Chronic groin pain     Plan:     After a long discussion, he does not wish to proceed with groin exploration with removal of the mesh and possible excision of the nerves to help try to release his chronic discomfort. I discussed the risks of this with him in detail. He understands and wishes to proceed

## 2013-12-05 ENCOUNTER — Telehealth (INDEPENDENT_AMBULATORY_CARE_PROVIDER_SITE_OTHER): Payer: Self-pay | Admitting: General Surgery

## 2013-12-05 ENCOUNTER — Encounter (INDEPENDENT_AMBULATORY_CARE_PROVIDER_SITE_OTHER): Payer: Self-pay | Admitting: Surgery

## 2013-12-05 NOTE — Telephone Encounter (Signed)
Called patient to let him know that he has a Rx for Hydrocodone 10/325 mg and patient will come up to the office and pick up Rx

## 2013-12-17 ENCOUNTER — Ambulatory Visit (HOSPITAL_BASED_OUTPATIENT_CLINIC_OR_DEPARTMENT_OTHER): Admission: RE | Admit: 2013-12-17 | Payer: Medicare Other | Source: Ambulatory Visit | Admitting: Surgery

## 2013-12-17 ENCOUNTER — Encounter (HOSPITAL_BASED_OUTPATIENT_CLINIC_OR_DEPARTMENT_OTHER): Admission: RE | Payer: Self-pay | Source: Ambulatory Visit

## 2013-12-17 SURGERY — EXPLORATION, INGUINAL REGION
Anesthesia: General | Laterality: Right

## 2013-12-20 ENCOUNTER — Ambulatory Visit (INDEPENDENT_AMBULATORY_CARE_PROVIDER_SITE_OTHER): Payer: Medicare Other | Admitting: Surgery

## 2013-12-20 ENCOUNTER — Encounter (INDEPENDENT_AMBULATORY_CARE_PROVIDER_SITE_OTHER): Payer: Self-pay | Admitting: Surgery

## 2013-12-20 DIAGNOSIS — R109 Unspecified abdominal pain: Secondary | ICD-10-CM

## 2013-12-20 DIAGNOSIS — G8929 Other chronic pain: Secondary | ICD-10-CM

## 2013-12-20 DIAGNOSIS — R1030 Lower abdominal pain, unspecified: Principal | ICD-10-CM

## 2013-12-20 MED ORDER — HYDROCODONE-ACETAMINOPHEN 10-325 MG PO TABS: 1.0000 | ORAL_TABLET | Freq: Four times a day (QID) | ORAL | Status: DC | PRN

## 2013-12-20 NOTE — Progress Notes (Signed)
Subjective:     Patient ID: [name], male   DOB: [birth year], 53 y.o.   MRN: [id number]  HPI His surgery had to be canceled for a severe upper respiratory infection with sinusitis. He remains on antibiotics and steroids. He still has significant joint pain.  Review of Systems     Objective:   Physical Exam On exam, his groin incision remains well healed with no evidence of hernia. I again performed an ilioinguinal nerve block  With a mixture of lidocaine and Marcaine injection    Assessment:     Chronic right groin pain     Plan:     Chronic groin pain. As soon as his upper respiratory infection resolves, he will call me back And I will Schedule the surgery ASAP.   I renewed his hydrocodone

## 2014-01-16 ENCOUNTER — Encounter (INDEPENDENT_AMBULATORY_CARE_PROVIDER_SITE_OTHER): Payer: Self-pay | Admitting: Surgery

## 2014-01-16 ENCOUNTER — Ambulatory Visit (INDEPENDENT_AMBULATORY_CARE_PROVIDER_SITE_OTHER): Payer: Medicare Other | Admitting: Surgery

## 2014-01-16 VITALS — BP 124/80 | HR 78 | Temp 97.8°F | Resp 16 | Ht 72.0 in | Wt 193.0 lb

## 2014-01-16 DIAGNOSIS — G8929 Other chronic pain: Secondary | ICD-10-CM

## 2014-01-16 DIAGNOSIS — R1030 Lower abdominal pain, unspecified: Principal | ICD-10-CM

## 2014-01-16 DIAGNOSIS — R109 Unspecified abdominal pain: Secondary | ICD-10-CM

## 2014-01-16 MED ORDER — HYDROCODONE-ACETAMINOPHEN 10-325 MG PO TABS: 1.0000 | ORAL_TABLET | Freq: Four times a day (QID) | ORAL | Status: DC | PRN

## 2014-01-16 NOTE — Progress Notes (Signed)
Subjective:     Patient ID: [name], male   DOB: Louderback 02, 1962, 53 y.o.   MRN: [id number]  HPI He is here for another evaluation of his groin. We have not been able to schedule his groin exploration because of her upper respiratory infection that has persisted.  Review of Systems     Objective:   Physical Exam On exam, there is no evidence of inguinal hernia.  I performed a right ilioinguinal nerve block the mixture of Marcaine and lidocaine    Assessment:     Chronic right groin pain     Plan:     Again, he is encouraged to try to quit smoking. As soon as his primary care physician feels it is reasonable, I'll proceed with right groin exploration.

## 2014-02-11 ENCOUNTER — Encounter (INDEPENDENT_AMBULATORY_CARE_PROVIDER_SITE_OTHER): Payer: Self-pay | Admitting: Surgery

## 2014-02-11 ENCOUNTER — Telehealth (INDEPENDENT_AMBULATORY_CARE_PROVIDER_SITE_OTHER): Payer: Self-pay

## 2014-02-11 ENCOUNTER — Ambulatory Visit (INDEPENDENT_AMBULATORY_CARE_PROVIDER_SITE_OTHER): Payer: Medicare Other | Admitting: Surgery

## 2014-02-11 VITALS — BP 130/90 | HR 78 | Temp 97.9°F | Resp 16 | Ht 72.0 in | Wt 189.0 lb

## 2014-02-11 DIAGNOSIS — R109 Unspecified abdominal pain: Secondary | ICD-10-CM

## 2014-02-11 DIAGNOSIS — R1030 Lower abdominal pain, unspecified: Principal | ICD-10-CM

## 2014-02-11 DIAGNOSIS — G8929 Other chronic pain: Secondary | ICD-10-CM

## 2014-02-11 MED ORDER — HYDROCODONE-ACETAMINOPHEN 10-325 MG PO TABS: 1.0000 | ORAL_TABLET | Freq: Four times a day (QID) | ORAL | Status: DC | PRN

## 2014-02-11 NOTE — Progress Notes (Signed)
Subjective:     Patient ID: [name], male   DOB: 1961-07-28, 53 y.o.   MRN: [id number]  HPI He is here today because of increasing discomfort in the right groin. He was pain-free for several weeks after her last injection. He reports that his bronchitis is finally improving  Review of Systems     Objective:   Physical Exam On exam, there is no hernia. He is tender over the distribution of the ilioinguinal nerve. I performed a right ilioinguinal nerve block with Marcaine and lidocaine    Assessment:     Chronic groin pain     Plan:     I will renew his pain medication. He plans on scheduling groin exploration when his bronchitis has improved further

## 2014-02-11 NOTE — Telephone Encounter (Signed)
Pt returning your call

## 2014-03-05 ENCOUNTER — Ambulatory Visit (INDEPENDENT_AMBULATORY_CARE_PROVIDER_SITE_OTHER): Payer: Medicare Other | Admitting: Surgery

## 2014-03-05 ENCOUNTER — Encounter (INDEPENDENT_AMBULATORY_CARE_PROVIDER_SITE_OTHER): Payer: Self-pay | Admitting: Surgery

## 2014-03-05 VITALS — BP 129/84 | HR 66 | Temp 97.5°F | Resp 18 | Ht 72.0 in | Wt 188.8 lb

## 2014-03-05 DIAGNOSIS — R1030 Lower abdominal pain, unspecified: Principal | ICD-10-CM

## 2014-03-05 DIAGNOSIS — R109 Unspecified abdominal pain: Secondary | ICD-10-CM

## 2014-03-05 DIAGNOSIS — G8929 Other chronic pain: Secondary | ICD-10-CM

## 2014-03-05 MED ORDER — HYDROCODONE-ACETAMINOPHEN 10-325 MG PO TABS: 1.0000 | ORAL_TABLET | Freq: Four times a day (QID) | ORAL | Status: DC | PRN

## 2014-03-05 NOTE — Progress Notes (Signed)
Subjective:     Patient ID: [name], male   DOB: [birth year], 53 y.o.   MRN: [id number]  HPI He is here for another visit regarding his chronic groin pain. He has not been able to schedule surgery yet because of significant family issues regarding his mother and him having to care for her.  Review of Systems     Objective:   Physical Exam On exam, there is no evidence of recurrent hernia. I again performed a right ilioinguinal nerve block with a mixture of Marcaine and lidocaine    Assessment:     Chronic right groin pain     Plan:     We still plan on exploring his groin at some point when it is convenient with him. For now, I would continue his chronic pain medicine and injections as needed

## 2014-03-28 ENCOUNTER — Ambulatory Visit (INDEPENDENT_AMBULATORY_CARE_PROVIDER_SITE_OTHER): Payer: Medicare Other | Admitting: Surgery

## 2014-03-28 ENCOUNTER — Encounter (INDEPENDENT_AMBULATORY_CARE_PROVIDER_SITE_OTHER): Payer: Self-pay | Admitting: Surgery

## 2014-03-28 VITALS — BP 122/70 | HR 100 | Temp 97.5°F | Ht 72.0 in | Wt 188.0 lb

## 2014-03-28 DIAGNOSIS — R1030 Lower abdominal pain, unspecified: Principal | ICD-10-CM

## 2014-03-28 DIAGNOSIS — R109 Unspecified abdominal pain: Secondary | ICD-10-CM

## 2014-03-28 DIAGNOSIS — G8929 Other chronic pain: Secondary | ICD-10-CM

## 2014-03-28 MED ORDER — HYDROCODONE-ACETAMINOPHEN 10-325 MG PO TABS: 1.0000 | ORAL_TABLET | Freq: Four times a day (QID) | ORAL | Status: DC | PRN

## 2014-03-28 NOTE — Progress Notes (Signed)
Subjective:     Patient ID: [name], male   DOB: 02/08/1961, 53 y.o.   MRN: [id number]  HPI He is here for a three-week followup. The last injection and going helped significantly.  Review of Systems     Objective:   Physical Exam On exam, there is no evidence of hernia. I again injected Marcaine and lidocaine performing an ilioinguinal nerve block on the right side    Assessment:     Chronic right groin pain     Plan:     Again, we will be planning on expiration when it is convenient with him. He is currently having to take care of his mother who is recently fallen breaking her ankle.

## 2014-04-08 ENCOUNTER — Observation Stay: Payer: Self-pay | Admitting: Internal Medicine

## 2014-04-08 LAB — HEPATIC FUNCTION PANEL A (ARMC)
ALBUMIN: 3.9 g/dL (ref 3.4–5.0)
ALK PHOS: 93 U/L
AST: 24 U/L (ref 15–37)
BILIRUBIN DIRECT: 0.1 mg/dL (ref 0.00–0.20)
Bilirubin,Total: 0.9 mg/dL (ref 0.2–1.0)
SGPT (ALT): 27 U/L (ref 12–78)
Total Protein: 7.8 g/dL (ref 6.4–8.2)

## 2014-04-08 LAB — BASIC METABOLIC PANEL
Anion Gap: 8 (ref 7–16)
BUN: 26 mg/dL — ABNORMAL HIGH (ref 7–18)
CALCIUM: 9.3 mg/dL (ref 8.5–10.1)
CHLORIDE: 108 mmol/L — AB (ref 98–107)
Co2: 21 mmol/L (ref 21–32)
Creatinine: 1.37 mg/dL — ABNORMAL HIGH (ref 0.60–1.30)
EGFR (Non-African Amer.): 58 — ABNORMAL LOW
Glucose: 93 mg/dL (ref 65–99)
OSMOLALITY: 278 (ref 275–301)
Potassium: 3.8 mmol/L (ref 3.5–5.1)
SODIUM: 137 mmol/L (ref 136–145)

## 2014-04-08 LAB — D-DIMER(ARMC): D-DIMER: 309 ng/mL

## 2014-04-08 LAB — CBC
HCT: 52.7 % — ABNORMAL HIGH (ref 40.0–52.0)
HGB: 17.7 g/dL (ref 13.0–18.0)
MCH: 33.3 pg (ref 26.0–34.0)
MCHC: 33.7 g/dL (ref 32.0–36.0)
MCV: 99 fL (ref 80–100)
PLATELETS: 289 10*3/uL (ref 150–440)
RBC: 5.32 10*6/uL (ref 4.40–5.90)
RDW: 13.1 % (ref 11.5–14.5)
WBC: 17.3 10*3/uL — AB (ref 3.8–10.6)

## 2014-04-08 LAB — LIPASE, BLOOD: Lipase: 110 U/L (ref 73–393)

## 2014-04-08 LAB — CK-MB
CK-MB: 0.7 ng/mL (ref 0.5–3.6)
CK-MB: 0.5 ng/mL (ref 0.5–3.6)
CK-MB: 0.9 ng/mL (ref 0.5–3.6)

## 2014-04-08 LAB — TROPONIN I
Troponin-I: <0.02 ng/mL
Troponin-I: <0.02 ng/mL
Troponin-I: <0.02 ng/mL

## 2014-04-09 LAB — COMPREHENSIVE METABOLIC PANEL
ALBUMIN: 3.1 g/dL — AB (ref 3.4–5.0)
ALT: 19 U/L (ref 12–78)
AST: 17 U/L (ref 15–37)
Alkaline Phosphatase: 71 U/L
Anion Gap: 3 — ABNORMAL LOW (ref 7–16)
BUN: 19 mg/dL — AB (ref 7–18)
Bilirubin,Total: 0.6 mg/dL (ref 0.2–1.0)
Calcium, Total: 8.2 mg/dL — ABNORMAL LOW (ref 8.5–10.1)
Chloride: 108 mmol/L — ABNORMAL HIGH (ref 98–107)
Co2: 28 mmol/L (ref 21–32)
Creatinine: 1.09 mg/dL (ref 0.60–1.30)
EGFR (African American): >60
EGFR (Non-African Amer.): >60
GLUCOSE: 93 mg/dL (ref 65–99)
Osmolality: 279 (ref 275–301)
POTASSIUM: 4.3 mmol/L (ref 3.5–5.1)
Sodium: 139 mmol/L (ref 136–145)
Total Protein: 6.2 g/dL — ABNORMAL LOW (ref 6.4–8.2)

## 2014-04-09 LAB — CBC WITH DIFFERENTIAL/PLATELET
Basophil #: 0 10*3/uL (ref 0.0–0.1)
Basophil %: 0.3 %
EOS PCT: 0.7 %
Eosinophil #: 0.1 10*3/uL (ref 0.0–0.7)
HCT: 44 % (ref 40.0–52.0)
HGB: 14.6 g/dL (ref 13.0–18.0)
Lymphocyte #: 2.8 10*3/uL (ref 1.0–3.6)
Lymphocyte %: 22.7 %
MCH: 33.2 pg (ref 26.0–34.0)
MCHC: 33.2 g/dL (ref 32.0–36.0)
MCV: 100 fL (ref 80–100)
MONO ABS: 1.2 x10 3/mm — AB (ref 0.2–1.0)
Monocyte %: 9.5 %
NEUTROS ABS: 8.4 10*3/uL — AB (ref 1.4–6.5)
NEUTROS PCT: 66.8 %
Platelet: 220 10*3/uL (ref 150–440)
RBC: 4.39 10*6/uL — AB (ref 4.40–5.90)
RDW: 13.2 % (ref 11.5–14.5)
WBC: 12.5 10*3/uL — ABNORMAL HIGH (ref 3.8–10.6)

## 2014-04-10 ENCOUNTER — Emergency Department: Payer: Self-pay | Admitting: Emergency Medicine

## 2014-04-10 LAB — COMPREHENSIVE METABOLIC PANEL
ALK PHOS: 82 U/L
AST: 25 U/L (ref 15–37)
Albumin: 3.5 g/dL (ref 3.4–5.0)
Anion Gap: 4 — ABNORMAL LOW (ref 7–16)
BUN: 21 mg/dL — ABNORMAL HIGH (ref 7–18)
Bilirubin,Total: 0.8 mg/dL (ref 0.2–1.0)
CO2: 28 mmol/L (ref 21–32)
Calcium, Total: 8.8 mg/dL (ref 8.5–10.1)
Chloride: 105 mmol/L (ref 98–107)
Creatinine: 0.74 mg/dL (ref 0.60–1.30)
EGFR (African American): >60
EGFR (Non-African Amer.): >60
Glucose: 93 mg/dL (ref 65–99)
Osmolality: 276 (ref 275–301)
Potassium: 4.3 mmol/L (ref 3.5–5.1)
SGPT (ALT): 25 U/L (ref 12–78)
Sodium: 137 mmol/L (ref 136–145)
Total Protein: 7 g/dL (ref 6.4–8.2)

## 2014-04-10 LAB — CBC
HCT: 47.6 % (ref 40.0–52.0)
HGB: 16.5 g/dL (ref 13.0–18.0)
MCH: 34.5 pg — ABNORMAL HIGH (ref 26.0–34.0)
MCHC: 34.6 g/dL (ref 32.0–36.0)
MCV: 100 fL (ref 80–100)
Platelet: 249 10*3/uL (ref 150–440)
RBC: 4.78 10*6/uL (ref 4.40–5.90)
RDW: 13.3 % (ref 11.5–14.5)
WBC: 13 10*3/uL — ABNORMAL HIGH (ref 3.8–10.6)

## 2014-04-10 LAB — APTT: ACTIVATED PTT: 32.2 s (ref 23.6–35.9)

## 2014-04-10 LAB — D-DIMER(ARMC): D-Dimer: 425 ng/ml

## 2014-04-10 LAB — TROPONIN I: Troponin-I: <0.02 ng/mL

## 2014-04-10 LAB — PROTIME-INR
INR: 1
Prothrombin Time: 12.9 secs (ref 11.5–14.7)

## 2014-04-10 LAB — CK TOTAL AND CKMB (NOT AT ARMC): CK, Total: 88 U/L

## 2014-04-10 LAB — PRO B NATRIURETIC PEPTIDE: B-TYPE NATIURETIC PEPTID: 25 pg/mL (ref 0–125)

## 2014-05-01 ENCOUNTER — Ambulatory Visit (INDEPENDENT_AMBULATORY_CARE_PROVIDER_SITE_OTHER): Payer: Medicare Other | Admitting: Surgery

## 2014-05-01 ENCOUNTER — Encounter (INDEPENDENT_AMBULATORY_CARE_PROVIDER_SITE_OTHER): Payer: Self-pay | Admitting: Surgery

## 2014-05-01 VITALS — BP 118/65 | HR 78 | Temp 97.9°F | Resp 16 | Ht 72.0 in | Wt 186.8 lb

## 2014-05-01 DIAGNOSIS — R109 Unspecified abdominal pain: Secondary | ICD-10-CM

## 2014-05-01 DIAGNOSIS — R1031 Right lower quadrant pain: Principal | ICD-10-CM

## 2014-05-01 DIAGNOSIS — G8929 Other chronic pain: Secondary | ICD-10-CM

## 2014-05-01 MED ORDER — HYDROCODONE-ACETAMINOPHEN 10-325 MG PO TABS: 1.0000 | ORAL_TABLET | Freq: Four times a day (QID) | ORAL | Status: DC | PRN

## 2014-05-01 NOTE — Progress Notes (Signed)
Subjective:     Patient ID: [name], male   DOB: 28-Dec-1960, 53 y.o.   MRN: [id number]  HPI He is here for another visit for chronic right groin pain. His bronchitis has completely resolved.  Review of Systems     Objective:   Physical Exam On exam, there is no recurrent hernia. I again performed an ilioinguinal nerve block with a mixture of Marcaine and lidocaine    Assessment:     Chronic right groin pain     Plan:     I renewed his hydrocodone. He'll come by to see me as needed. He is definitely leaning toward letting me explore his right groin again for the chronic pain and possible nerve entrapment

## 2014-05-30 ENCOUNTER — Encounter (INDEPENDENT_AMBULATORY_CARE_PROVIDER_SITE_OTHER): Payer: Self-pay | Admitting: Surgery

## 2014-05-30 ENCOUNTER — Ambulatory Visit (INDEPENDENT_AMBULATORY_CARE_PROVIDER_SITE_OTHER): Payer: Medicare Other | Admitting: Surgery

## 2014-05-30 VITALS — BP 127/86 | HR 81 | Temp 97.1°F | Resp 18 | Ht 72.0 in | Wt 191.0 lb

## 2014-05-30 DIAGNOSIS — R109 Unspecified abdominal pain: Secondary | ICD-10-CM

## 2014-05-30 DIAGNOSIS — G8929 Other chronic pain: Secondary | ICD-10-CM

## 2014-05-30 DIAGNOSIS — R1031 Right lower quadrant pain: Principal | ICD-10-CM

## 2014-05-30 MED ORDER — HYDROCODONE-ACETAMINOPHEN 10-325 MG PO TABS: 1.0000 | ORAL_TABLET | Freq: Four times a day (QID) | ORAL | Status: DC | PRN

## 2014-05-30 NOTE — Progress Notes (Signed)
Subjective:     Patient ID: [name], male   DOB: [birth year], 53 y.o.   MRN: [id number]  HPI He is here today again for his chronic right groin pain. His last injection he got in approximately 3 weeks without much discomfort at all. It is now coming back again.  Review of Systems     Objective:   Physical Exam On exam there is no evidence of hernia. I again performed a right ilioinguinal nerve block with Marcaine and lidocaine. I used approximately 20 cc mixture of both medications    Assessment:     Chronic right groin pain    Plan:     He would like to continue to hold on surgery until after the summer which is very reasonable. I will continue to do intermittent nerve blocks and reduce pain medication as needed.

## 2014-06-27 ENCOUNTER — Encounter (INDEPENDENT_AMBULATORY_CARE_PROVIDER_SITE_OTHER): Payer: Self-pay | Admitting: Surgery

## 2014-06-27 ENCOUNTER — Ambulatory Visit (INDEPENDENT_AMBULATORY_CARE_PROVIDER_SITE_OTHER): Payer: Medicare Other | Admitting: Surgery

## 2014-06-27 VITALS — BP 116/74 | HR 78 | Temp 98.0°F | Ht 72.0 in | Wt 190.0 lb

## 2014-06-27 DIAGNOSIS — R109 Unspecified abdominal pain: Secondary | ICD-10-CM

## 2014-06-27 DIAGNOSIS — R1031 Right lower quadrant pain: Principal | ICD-10-CM

## 2014-06-27 DIAGNOSIS — G8929 Other chronic pain: Secondary | ICD-10-CM

## 2014-06-27 MED ORDER — HYDROCODONE-ACETAMINOPHEN 10-325 MG PO TABS: 1.0000 | ORAL_TABLET | Freq: Four times a day (QID) | ORAL | Status: DC | PRN

## 2014-06-27 NOTE — Progress Notes (Signed)
Subjective:     Patient ID: [name], male   DOB: 1960/12/10, 53 y.o.   MRN: [id number]  HPI He is here for another visit regarding his chronic right groin pain. It has been a month since his last injection. His pain started to become severe again.  Review of Systems     Objective:   Physical Exam On exam, there is no evidence of recurrent hernia. I performed a right ilioinguinal nerve block with Marcaine.    Assessment:     Chronic groin pain.     Plan:     Again, he was to hold off groin exploration which is reasonable as it is difficult to tell felt. I will renew his hydrocodone and see him back in one month

## 2014-07-08 ENCOUNTER — Emergency Department: Payer: Self-pay | Admitting: Emergency Medicine

## 2014-07-08 LAB — URINALYSIS, COMPLETE
Bilirubin,UR: NEGATIVE
Glucose,UR: NEGATIVE mg/dL (ref 0–75)
Hyaline Cast: 2
Leukocyte Esterase: NEGATIVE
NITRITE: NEGATIVE
PH: 5 (ref 4.5–8.0)
Protein: 100
RBC,UR: 700 /HPF (ref 0–5)
Specific Gravity: 1.029 (ref 1.003–1.030)
Squamous Epithelial: NONE SEEN
WBC UR: 9 /HPF (ref 0–5)

## 2014-07-08 LAB — COMPREHENSIVE METABOLIC PANEL
ALT: 21 U/L
ANION GAP: 6 — AB (ref 7–16)
Albumin: 3.8 g/dL (ref 3.4–5.0)
Alkaline Phosphatase: 83 U/L
BILIRUBIN TOTAL: 0.7 mg/dL (ref 0.2–1.0)
BUN: 13 mg/dL (ref 7–18)
CALCIUM: 9 mg/dL (ref 8.5–10.1)
CHLORIDE: 108 mmol/L — AB (ref 98–107)
CO2: 24 mmol/L (ref 21–32)
Creatinine: 1.09 mg/dL (ref 0.60–1.30)
EGFR (African American): >60
EGFR (Non-African Amer.): >60
Glucose: 126 mg/dL — ABNORMAL HIGH (ref 65–99)
Osmolality: 277 (ref 275–301)
Potassium: 3.5 mmol/L (ref 3.5–5.1)
SGOT(AST): 15 U/L (ref 15–37)
Sodium: 138 mmol/L (ref 136–145)
TOTAL PROTEIN: 7.3 g/dL (ref 6.4–8.2)

## 2014-07-08 LAB — CBC
HCT: 48.6 % (ref 40.0–52.0)
HGB: 16.1 g/dL (ref 13.0–18.0)
MCH: 33.5 pg (ref 26.0–34.0)
MCHC: 33.2 g/dL (ref 32.0–36.0)
MCV: 101 fL — ABNORMAL HIGH (ref 80–100)
Platelet: 260 10*3/uL (ref 150–440)
RBC: 4.81 10*6/uL (ref 4.40–5.90)
RDW: 13.5 % (ref 11.5–14.5)
WBC: 13.1 10*3/uL — AB (ref 3.8–10.6)

## 2014-07-08 LAB — LIPASE, BLOOD: Lipase: 73 U/L (ref 73–393)

## 2014-08-08 ENCOUNTER — Other Ambulatory Visit (INDEPENDENT_AMBULATORY_CARE_PROVIDER_SITE_OTHER): Payer: Self-pay | Admitting: Surgery

## 2014-08-13 ENCOUNTER — Encounter (HOSPITAL_COMMUNITY): Payer: Self-pay | Admitting: Pharmacy Technician

## 2014-08-19 ENCOUNTER — Encounter (HOSPITAL_COMMUNITY): Payer: Self-pay | Admitting: *Deleted

## 2014-08-19 MED ORDER — CEFAZOLIN SODIUM-DEXTROSE 2-3 GM-% IV SOLR
2.0000 g | INTRAVENOUS | Status: AC
  Administered 2014-08-20: 2 g via INTRAVENOUS
  Filled 2014-08-19: qty 50

## 2014-08-19 NOTE — Progress Notes (Signed)
08/19/14 1057  OBSTRUCTIVE SLEEP APNEA  Have you ever been diagnosed with sleep apnea through a sleep study? No  Do you snore loudly (loud enough to be heard through closed doors)?  1  Do you often feel tired, fatigued, or sleepy during the daytime? 1  Has anyone observed you stop breathing during your sleep? 0  Do you have, or are you being treated for high blood pressure? 0  BMI more than 35 kg/m2? 0  Age over 54 years old? 1  Neck circumference greater than 40 cm/16 inches? 1 (17.5)  Gender: 1  Obstructive Sleep Apnea Score 5  Score 4 or greater  Results sent to PCP

## 2014-08-20 ENCOUNTER — Encounter (HOSPITAL_COMMUNITY): Payer: Self-pay | Admitting: *Deleted

## 2014-08-20 ENCOUNTER — Encounter (HOSPITAL_COMMUNITY): Payer: Medicare Other | Admitting: Anesthesiology

## 2014-08-20 ENCOUNTER — Encounter (HOSPITAL_COMMUNITY)
Admission: RE | Disposition: A | Discharge status: Home / Self Care | Payer: Self-pay | Source: Ambulatory Visit | Attending: Surgery

## 2014-08-20 ENCOUNTER — Ambulatory Visit (HOSPITAL_COMMUNITY): Payer: Medicare Other | Admitting: Anesthesiology

## 2014-08-20 ENCOUNTER — Inpatient Hospital Stay (HOSPITAL_COMMUNITY)
Admission: RE | Admit: 2014-08-20 | Discharge: 2014-08-22 | DRG: 352 | Disposition: A | Discharge status: Home / Self Care | Payer: Medicare Other | Source: Ambulatory Visit | Attending: Surgery | Admitting: Surgery

## 2014-08-20 DIAGNOSIS — Z886 Allergy status to analgesic agent status: Secondary | ICD-10-CM

## 2014-08-20 DIAGNOSIS — Z87442 Personal history of urinary calculi: Secondary | ICD-10-CM

## 2014-08-20 DIAGNOSIS — F17210 Nicotine dependence, cigarettes, uncomplicated: Secondary | ICD-10-CM | POA: Diagnosis present

## 2014-08-20 DIAGNOSIS — R1030 Lower abdominal pain, unspecified: Secondary | ICD-10-CM

## 2014-08-20 DIAGNOSIS — K4090 Unilateral inguinal hernia, without obstruction or gangrene, not specified as recurrent: Principal | ICD-10-CM | POA: Diagnosis present

## 2014-08-20 DIAGNOSIS — G8929 Other chronic pain: Secondary | ICD-10-CM | POA: Diagnosis present

## 2014-08-20 HISTORY — DX: Other complications of anesthesia, initial encounter: T88.59XA

## 2014-08-20 HISTORY — DX: Adverse effect of unspecified anesthetic, initial encounter: T41.45XA

## 2014-08-20 HISTORY — PX: GROIN DISSECTION: SHX5250

## 2014-08-20 HISTORY — DX: Headache, unspecified: R51.9

## 2014-08-20 HISTORY — PX: INGUINAL HERNIA REPAIR: SUR1180

## 2014-08-20 HISTORY — PX: GROIN EXPLORATION: SHX1713

## 2014-08-20 HISTORY — DX: Other specified postprocedural states: Z98.890

## 2014-08-20 HISTORY — DX: Headache: R51

## 2014-08-20 HISTORY — DX: Nausea with vomiting, unspecified: R11.2

## 2014-08-20 LAB — CBC
HCT: 46.5 % (ref 39.0–52.0)
HEMOGLOBIN: 16 g/dL (ref 13.0–17.0)
MCH: 34.6 pg — AB (ref 26.0–34.0)
MCHC: 34.4 g/dL (ref 30.0–36.0)
MCV: 100.4 fL — ABNORMAL HIGH (ref 78.0–100.0)
PLATELETS: 185 10*3/uL (ref 150–400)
RBC: 4.63 MIL/uL (ref 4.22–5.81)
RDW: 12.2 % (ref 11.5–15.5)
WBC: 7.3 10*3/uL (ref 4.0–10.5)

## 2014-08-20 SURGERY — EXPLORATION, INGUINAL REGION
Anesthesia: General | Site: Groin | Laterality: Right

## 2014-08-20 MED ORDER — DIPHENHYDRAMINE HCL 12.5 MG/5ML PO ELIX
12.5000 mg | ORAL_SOLUTION | Freq: Four times a day (QID) | ORAL | Status: DC | PRN
  Filled 2014-08-20: qty 5

## 2014-08-20 MED ORDER — HYDROMORPHONE 0.3 MG/ML IV SOLN
INTRAVENOUS | Status: DC
  Administered 2014-08-20: 12:00:00 via INTRAVENOUS

## 2014-08-20 MED ORDER — GLYCOPYRROLATE 0.2 MG/ML IJ SOLN
INTRAMUSCULAR | Status: AC
  Filled 2014-08-20: qty 3

## 2014-08-20 MED ORDER — NALOXONE HCL 0.4 MG/ML IJ SOLN
0.4000 mg | INTRAMUSCULAR | Status: DC | PRN
  Filled 2014-08-20: qty 1

## 2014-08-20 MED ORDER — PROMETHAZINE HCL 25 MG/ML IJ SOLN
12.5000 mg | Freq: Once | INTRAMUSCULAR | Status: DC
  Filled 2014-08-20: qty 1

## 2014-08-20 MED ORDER — POTASSIUM CHLORIDE IN NACL 20-0.9 MEQ/L-% IV SOLN
INTRAVENOUS | Status: DC
  Administered 2014-08-20 – 2014-08-21 (×2): via INTRAVENOUS
  Filled 2014-08-20 (×5): qty 1000

## 2014-08-20 MED ORDER — EPHEDRINE SULFATE 50 MG/ML IJ SOLN
INTRAMUSCULAR | Status: AC
  Filled 2014-08-20: qty 1

## 2014-08-20 MED ORDER — FENTANYL CITRATE 0.05 MG/ML IJ SOLN
INTRAMUSCULAR | Status: AC
  Filled 2014-08-20: qty 2

## 2014-08-20 MED ORDER — FENTANYL CITRATE 0.05 MG/ML IJ SOLN
50.0000 ug | INTRAMUSCULAR | Status: DC | PRN
  Administered 2014-08-21 – 2014-08-22 (×4): 50 ug via INTRAVENOUS
  Filled 2014-08-20 (×4): qty 2

## 2014-08-20 MED ORDER — PROMETHAZINE HCL 25 MG/ML IJ SOLN
INTRAMUSCULAR | Status: DC | PRN
  Administered 2014-08-20: 12.5 mg via INTRAVENOUS

## 2014-08-20 MED ORDER — PROPOFOL 10 MG/ML IV BOLUS
INTRAVENOUS | Status: DC | PRN
  Administered 2014-08-20: 130 mg via INTRAVENOUS

## 2014-08-20 MED ORDER — SODIUM CHLORIDE 0.9 % IJ SOLN
9.0000 mL | INTRAMUSCULAR | Status: DC | PRN
Start: 2014-08-20 — End: 2014-08-20

## 2014-08-20 MED ORDER — NALOXONE HCL 0.4 MG/ML IJ SOLN: 0.4000 mg | INTRAMUSCULAR | Status: DC | PRN

## 2014-08-20 MED ORDER — GLYCOPYRROLATE 0.2 MG/ML IJ SOLN
INTRAMUSCULAR | Status: AC
  Filled 2014-08-20: qty 1

## 2014-08-20 MED ORDER — 0.9 % SODIUM CHLORIDE (POUR BTL) OPTIME
TOPICAL | Status: DC | PRN
  Administered 2014-08-20: 1000 mL

## 2014-08-20 MED ORDER — BACLOFEN 10 MG PO TABS
10.0000 mg | ORAL_TABLET | Freq: Three times a day (TID) | ORAL | Status: DC
  Administered 2014-08-20 – 2014-08-22 (×6): 10 mg via ORAL
  Filled 2014-08-20 (×8): qty 1

## 2014-08-20 MED ORDER — SCOPOLAMINE 1 MG/3DAYS TD PT72
MEDICATED_PATCH | TRANSDERMAL | Status: AC
  Filled 2014-08-20: qty 1

## 2014-08-20 MED ORDER — ARTIFICIAL TEARS OP OINT
TOPICAL_OINTMENT | OPHTHALMIC | Status: AC
  Filled 2014-08-20: qty 3.5

## 2014-08-20 MED ORDER — ONDANSETRON HCL 4 MG/2ML IJ SOLN
INTRAMUSCULAR | Status: AC
  Filled 2014-08-20: qty 2

## 2014-08-20 MED ORDER — LIDOCAINE HCL (CARDIAC) 20 MG/ML IV SOLN
INTRAVENOUS | Status: AC
  Filled 2014-08-20: qty 5

## 2014-08-20 MED ORDER — BUPIVACAINE-EPINEPHRINE 0.25% -1:200000 IJ SOLN
INTRAMUSCULAR | Status: DC | PRN
  Administered 2014-08-20: 30 mL

## 2014-08-20 MED ORDER — FENTANYL CITRATE 0.05 MG/ML IJ SOLN
INTRAMUSCULAR | Status: DC | PRN
  Administered 2014-08-20: 50 ug via INTRAVENOUS
  Administered 2014-08-20: 150 ug via INTRAVENOUS
  Administered 2014-08-20: 100 ug via INTRAVENOUS

## 2014-08-20 MED ORDER — HYDROMORPHONE HCL 1 MG/ML IJ SOLN
1.0000 mg | INTRAMUSCULAR | Status: DC | PRN
Start: 2014-08-20 — End: 2014-08-22
  Administered 2014-08-21: 1 mg via INTRAVENOUS
  Filled 2014-08-20: qty 1

## 2014-08-20 MED ORDER — HYDROMORPHONE 0.3 MG/ML IV SOLN
INTRAVENOUS | Status: AC
  Filled 2014-08-20: qty 25

## 2014-08-20 MED ORDER — SODIUM CHLORIDE 0.9 % IJ SOLN
INTRAMUSCULAR | Status: AC
  Filled 2014-08-20: qty 10

## 2014-08-20 MED ORDER — SUCCINYLCHOLINE CHLORIDE 20 MG/ML IJ SOLN
INTRAMUSCULAR | Status: AC
  Filled 2014-08-20: qty 1

## 2014-08-20 MED ORDER — HYDROMORPHONE 0.3 MG/ML IV SOLN
INTRAVENOUS | Status: DC
  Administered 2014-08-20: 1.5 mg via INTRAVENOUS
  Administered 2014-08-20: 0.9 mg via INTRAVENOUS
  Administered 2014-08-21: 1.5 mg via INTRAVENOUS
  Administered 2014-08-21: 1.2 mg via INTRAVENOUS
  Administered 2014-08-21: 0.3 mg via INTRAVENOUS
  Administered 2014-08-21: 1.2 mg via INTRAVENOUS
  Filled 2014-08-20: qty 25

## 2014-08-20 MED ORDER — LACTATED RINGERS IV SOLN
INTRAVENOUS | Status: DC
  Administered 2014-08-20 (×2): via INTRAVENOUS

## 2014-08-20 MED ORDER — ROCURONIUM BROMIDE 100 MG/10ML IV SOLN
INTRAVENOUS | Status: DC | PRN
  Administered 2014-08-20: 30 mg via INTRAVENOUS
  Administered 2014-08-20: 10 mg via INTRAVENOUS

## 2014-08-20 MED ORDER — GLYCOPYRROLATE 0.2 MG/ML IJ SOLN
INTRAMUSCULAR | Status: DC | PRN
  Administered 2014-08-20: .4 mg via INTRAVENOUS

## 2014-08-20 MED ORDER — DIPHENHYDRAMINE HCL 50 MG/ML IJ SOLN: 12.5000 mg | Freq: Four times a day (QID) | INTRAMUSCULAR | Status: DC | PRN

## 2014-08-20 MED ORDER — FENTANYL CITRATE 0.05 MG/ML IJ SOLN
25.0000 ug | INTRAMUSCULAR | Status: DC | PRN
  Administered 2014-08-20 (×3): 50 ug via INTRAVENOUS

## 2014-08-20 MED ORDER — MIDAZOLAM HCL 5 MG/5ML IJ SOLN
INTRAMUSCULAR | Status: DC | PRN
  Administered 2014-08-20: 2 mg via INTRAVENOUS

## 2014-08-20 MED ORDER — ONDANSETRON HCL 4 MG/2ML IJ SOLN
4.0000 mg | Freq: Four times a day (QID) | INTRAMUSCULAR | Status: DC | PRN
  Filled 2014-08-20: qty 2

## 2014-08-20 MED ORDER — LIDOCAINE HCL (CARDIAC) 20 MG/ML IV SOLN
INTRAVENOUS | Status: DC | PRN
  Administered 2014-08-20: 100 mg via INTRAVENOUS

## 2014-08-20 MED ORDER — ROCURONIUM BROMIDE 50 MG/5ML IV SOLN
INTRAVENOUS | Status: AC
  Filled 2014-08-20: qty 1

## 2014-08-20 MED ORDER — ONDANSETRON HCL 4 MG/2ML IJ SOLN
4.0000 mg | Freq: Four times a day (QID) | INTRAMUSCULAR | Status: DC | PRN
  Administered 2014-08-20: 4 mg via INTRAVENOUS

## 2014-08-20 MED ORDER — PROPOFOL 10 MG/ML IV BOLUS
INTRAVENOUS | Status: AC
  Filled 2014-08-20: qty 20

## 2014-08-20 MED ORDER — ENOXAPARIN SODIUM 40 MG/0.4ML ~~LOC~~ SOLN
40.0000 mg | SUBCUTANEOUS | Status: DC
  Administered 2014-08-21 – 2014-08-22 (×2): 40 mg via SUBCUTANEOUS
  Filled 2014-08-20 (×3): qty 0.4

## 2014-08-20 MED ORDER — DIPHENHYDRAMINE HCL 12.5 MG/5ML PO ELIX: 12.5000 mg | ORAL_SOLUTION | Freq: Four times a day (QID) | ORAL | Status: DC | PRN

## 2014-08-20 MED ORDER — FENTANYL CITRATE 0.05 MG/ML IJ SOLN
INTRAMUSCULAR | Status: AC
  Filled 2014-08-20: qty 5

## 2014-08-20 MED ORDER — DIPHENHYDRAMINE HCL 50 MG/ML IJ SOLN
12.5000 mg | Freq: Four times a day (QID) | INTRAMUSCULAR | Status: DC | PRN
  Filled 2014-08-20: qty 0.25

## 2014-08-20 MED ORDER — PROMETHAZINE HCL 25 MG/ML IJ SOLN
12.5000 mg | Freq: Four times a day (QID) | INTRAMUSCULAR | Status: DC | PRN
  Administered 2014-08-21 – 2014-08-22 (×2): 12.5 mg via INTRAVENOUS
  Filled 2014-08-20 (×2): qty 1

## 2014-08-20 MED ORDER — ONDANSETRON HCL 4 MG/2ML IJ SOLN: 4.0000 mg | Freq: Four times a day (QID) | INTRAMUSCULAR | Status: DC | PRN

## 2014-08-20 MED ORDER — ONDANSETRON HCL 4 MG/2ML IJ SOLN
INTRAMUSCULAR | Status: DC | PRN
  Administered 2014-08-20: 4 mg via INTRAVENOUS

## 2014-08-20 MED ORDER — NEOSTIGMINE METHYLSULFATE 10 MG/10ML IV SOLN
INTRAVENOUS | Status: AC
  Filled 2014-08-20: qty 1

## 2014-08-20 MED ORDER — ONDANSETRON HCL 4 MG PO TABS: 4.0000 mg | ORAL_TABLET | Freq: Four times a day (QID) | ORAL | Status: DC | PRN

## 2014-08-20 MED ORDER — BUPIVACAINE-EPINEPHRINE (PF) 0.25% -1:200000 IJ SOLN
INTRAMUSCULAR | Status: AC
  Filled 2014-08-20: qty 30

## 2014-08-20 MED ORDER — HYDROMORPHONE 0.3 MG/ML IV SOLN: INTRAVENOUS | Status: DC

## 2014-08-20 MED ORDER — ONDANSETRON HCL 4 MG PO TABS
4.0000 mg | ORAL_TABLET | Freq: Four times a day (QID) | ORAL | Status: DC | PRN
Start: 2014-08-20 — End: 2014-08-22

## 2014-08-20 MED ORDER — MIDAZOLAM HCL 2 MG/2ML IJ SOLN
INTRAMUSCULAR | Status: AC
  Filled 2014-08-20: qty 2

## 2014-08-20 MED ORDER — SODIUM CHLORIDE 0.9 % IJ SOLN: 9.0000 mL | INTRAMUSCULAR | Status: DC | PRN

## 2014-08-20 MED ORDER — GABAPENTIN 400 MG PO CAPS
400.0000 mg | ORAL_CAPSULE | Freq: Three times a day (TID) | ORAL | Status: DC
  Administered 2014-08-20 – 2014-08-22 (×5): 400 mg via ORAL
  Filled 2014-08-20 (×8): qty 1

## 2014-08-20 MED ORDER — HYDROCODONE-ACETAMINOPHEN 10-325 MG PO TABS
1.0000 | ORAL_TABLET | ORAL | Status: DC | PRN
  Administered 2014-08-21 – 2014-08-22 (×5): 2 via ORAL
  Filled 2014-08-20 (×6): qty 2

## 2014-08-20 MED ORDER — NEOSTIGMINE METHYLSULFATE 10 MG/10ML IV SOLN
INTRAVENOUS | Status: DC | PRN
  Administered 2014-08-20: 3 mg via INTRAVENOUS

## 2014-08-20 SURGICAL SUPPLY — 53 items
ADH SKN CLS APL DERMABOND .7 (GAUZE/BANDAGES/DRESSINGS) ×1
BLADE SURG 10 STRL SS (BLADE) ×2 IMPLANT
BLADE SURG 15 STRL LF DISP TIS (BLADE) IMPLANT
BLADE SURG 15 STRL SS (BLADE) ×3
BLADE SURG ROTATE 9660 (MISCELLANEOUS) ×2 IMPLANT
CANISTER SUCTION 2500CC (MISCELLANEOUS) IMPLANT
CHLORAPREP W/TINT 26ML (MISCELLANEOUS) ×3 IMPLANT
CLOSURE WOUND 1/2 X4 (GAUZE/BANDAGES/DRESSINGS) ×1
COVER SURGICAL LIGHT HANDLE (MISCELLANEOUS) ×3 IMPLANT
DERMABOND ADVANCED (GAUZE/BANDAGES/DRESSINGS) ×2
DERMABOND ADVANCED .7 DNX12 (GAUZE/BANDAGES/DRESSINGS) ×1 IMPLANT
DRAIN PENROSE 1/2X12 LTX STRL (WOUND CARE) IMPLANT
DRAPE LAPAROSCOPIC ABDOMINAL (DRAPES) ×2 IMPLANT
DRAPE UTILITY 15X26 W/TAPE STR (DRAPE) ×6 IMPLANT
ELECT CAUTERY BLADE 6.4 (BLADE) ×3 IMPLANT
ELECT REM PT RETURN 9FT ADLT (ELECTROSURGICAL) ×3
ELECTRODE REM PT RTRN 9FT ADLT (ELECTROSURGICAL) ×1 IMPLANT
GOWN STRL REUS W/ TWL LRG LVL3 (GOWN DISPOSABLE) ×1 IMPLANT
GOWN STRL REUS W/ TWL XL LVL3 (GOWN DISPOSABLE) ×1 IMPLANT
GOWN STRL REUS W/TWL LRG LVL3 (GOWN DISPOSABLE) ×3
GOWN STRL REUS W/TWL XL LVL3 (GOWN DISPOSABLE) ×3
KIT BASIN OR (CUSTOM PROCEDURE TRAY) ×3 IMPLANT
KIT ROOM TURNOVER OR (KITS) ×3 IMPLANT
MESH PARIETEX PROGRIP RIGHT (Mesh General) ×3 IMPLANT
NDL HYPO 25GX1X1/2 BEV (NEEDLE) ×1 IMPLANT
NEEDLE HYPO 25GX1X1/2 BEV (NEEDLE) ×3 IMPLANT
NS IRRIG 1000ML POUR BTL (IV SOLUTION) ×3 IMPLANT
PACK SURGICAL SETUP 50X90 (CUSTOM PROCEDURE TRAY) ×3 IMPLANT
PAD ARMBOARD 7.5X6 YLW CONV (MISCELLANEOUS) ×3 IMPLANT
PENCIL BUTTON HOLSTER BLD 10FT (ELECTRODE) ×3 IMPLANT
PLUG ATRIUM AND PATCH X LRG (MISCELLANEOUS) IMPLANT
SPECIMEN JAR SMALL (MISCELLANEOUS) IMPLANT
SPONGE GAUZE 4X4 12PLY STER LF (GAUZE/BANDAGES/DRESSINGS) ×2 IMPLANT
SPONGE LAP 18X18 X RAY DECT (DISPOSABLE) ×3 IMPLANT
STRIP CLOSURE SKIN 1/2X4 (GAUZE/BANDAGES/DRESSINGS) ×1 IMPLANT
SUT MNCRL AB 4-0 PS2 18 (SUTURE) ×3 IMPLANT
SUT PROLENE 2 0 CT2 30 (SUTURE) IMPLANT
SUT VIC AB 2-0 CT1 27 (SUTURE) ×12
SUT VIC AB 2-0 CT1 TAPERPNT 27 (SUTURE) ×4 IMPLANT
SUT VIC AB 3-0 SH 18 (SUTURE) ×2 IMPLANT
SUT VIC AB 3-0 SH 27 (SUTURE) ×9
SUT VIC AB 3-0 SH 27X BRD (SUTURE) ×1 IMPLANT
SUT VIC AB 4-0 SH 27 (SUTURE) ×3
SUT VIC AB 4-0 SH 27XBRD (SUTURE) IMPLANT
SUT VICRYL AB 3 0 TIES (SUTURE) IMPLANT
SYR BULB 3OZ (MISCELLANEOUS) ×3 IMPLANT
SYR CONTROL 10ML LL (SYRINGE) ×3 IMPLANT
TAPE CLOTH SURG 4X10 WHT LF (GAUZE/BANDAGES/DRESSINGS) ×2 IMPLANT
TOWEL OR 17X26 10 PK STRL BLUE (TOWEL DISPOSABLE) ×3 IMPLANT
TUBE CONNECTING 12'X1/4 (SUCTIONS) ×1
TUBE CONNECTING 12X1/4 (SUCTIONS) ×1 IMPLANT
WATER STERILE IRR 1000ML POUR (IV SOLUTION) IMPLANT
YANKAUER SUCT BULB TIP NO VENT (SUCTIONS) ×3 IMPLANT

## 2014-08-20 NOTE — Anesthesia Preprocedure Evaluation (Addendum)
Anesthesia Evaluation  Patient identified by MRN, date of birth, ID band Patient awake    Reviewed: Allergy & Precautions, H&P , NPO status , Patient's Chart, lab work & pertinent test results  History of Anesthesia Complications (+) PONV  Airway Mallampati: II      Dental   Pulmonary Current Smoker,  breath sounds clear to auscultation        Cardiovascular negative cardio ROS  Rhythm:Regular Rate:Normal     Neuro/Psych  Headaches,    GI/Hepatic negative GI ROS, Neg liver ROS,   Endo/Other    Renal/GU Renal disease     Musculoskeletal   Abdominal   Peds  Hematology   Anesthesia Other Findings   Reproductive/Obstetrics                        Anesthesia Physical Anesthesia Plan  ASA: III  Anesthesia Plan: General   Post-op Pain Management:    Induction: Intravenous  Airway Management Planned: Oral ETT  Additional Equipment:   Intra-op Plan:   Post-operative Plan: Extubation in OR  Informed Consent: I have reviewed the patients History and Physical, chart, labs and discussed the procedure including the risks, benefits and alternatives for the proposed anesthesia with the patient or authorized representative who has indicated his/her understanding and acceptance.   Dental advisory given  Plan Discussed with: CRNA, Anesthesiologist and Surgeon  Anesthesia Plan Comments:        Anesthesia Quick Evaluation

## 2014-08-20 NOTE — Anesthesia Postprocedure Evaluation (Signed)
  Anesthesia Post-op Note  Patient: [name]  Procedure(s) Performed: Procedure(s): RIGHT GROIN EXPLORATION (Right)  Patient Location: PACU  Anesthesia Type:General  Level of Consciousness: awake  Airway and Oxygen Therapy: Patient Spontanous Breathing  Post-op Pain: mild  Post-op Assessment: Post-op Vital signs reviewed  Post-op Vital Signs: Reviewed  Last Vitals:  Filed Vitals:   08/20/14 1000  BP:   Pulse: 72  Temp:   Resp: 13    Complications: No apparent anesthesia complications

## 2014-08-20 NOTE — Op Note (Signed)
NAMMarland Kitchen:  [name], [name]                 ACCOUNT NO.:  [id number]  MEDICAL RECORD NO.:  [id number]  LOCATION:  [facility]                         FACILITY:  [facility]  PHYSICIAN:  [name], M.D. DATE OF BIRTH:  October 26, 1961  DATE OF PROCEDURE:  08/20/2014 DATE OF DISCHARGE:                              OPERATIVE REPORT   PREOPERATIVE DIAGNOSIS:  Chronic right groin pain.  POSTOPERATIVE DIAGNOSIS:  Chronic right groin pain.  PROCEDURE: 1. Right groin exploration. 2. Excision of scar tissue. 3. Removal of previously placed mesh. 4. Repair of direct right inguinal hernia with mesh.  SURGEON:  [name], M.D.  ANESTHESIA:  General and 0.25% Marcaine.  ESTIMATED BLOOD LOSS:  Minimal.  INDICATIONS:  [name] is a 53 year old gentleman who has had 2 separate right inguinal hernia repairs with mesh.  He had persistent chronic pain requiring ilioinguinal nerve injections.  Because of his worsening discomfort, he has been agreeable to proceed with a groin exploration.  FINDINGS:  The patient was found to have an intense amount of scarring around the testicular cord in the right inguinal area.  Ilioinguinal nerve in the area of the mesh could not be identified.  I removed his previously placed mesh.  There was an old direct hernia sac underneath the mesh, which I excised.  I then repaired the inguinal floor with another piece of PROCEED ProGrip Prolene mesh.  PROCEDURE IN DETAIL:  The patient was brought to the operating room, identified as [name].  He was placed supine on the operating room table and anesthesia was induced.  His right groin and abdomen were then prepped and draped in usual sterile fashion.  I excised the previous scar at the skin with scalpel and excised it completely with electrocautery.  I then went down through the Scarpa's fascia with the electrocautery.  The external oblique fascia was then identified and opened towards the internal and external rings.   The patient had extensive amount of scarring below the external oblique fascia.  I had to excise chronic scar in this area.  I removed the previously placed Prolene mesh in its entirety with the cautery.  I identified the testicular cord and structures, which were completely infused in scar. I was finally able to mobilize this.  Once the mesh was removed, there was a direct hernia defect.  There was an old sac present, but nothing was incarcerated into the sac.  I then completely removed the sac and then reapproximated the inguinal floor as best as possible with interrupted 2-0 Vicryl sutures.  I then freed up the testicular cord as much as possible from the surrounding cord structures.  This was quite difficult secondary to the hard firm scarring in the area.  Once this was completed, I brought the Prolene ProGrip mesh onto the field.  I placed it around the cord structures and then secured it to the pubic tubercle with a single Vicryl suture.  Wide coverage of the inguinal floor appeared to be achieved.  I then closed the remaining external oblique fascia over top of this with a running 2-0 Vicryl suture. Again, no ilioinguinal nerve was identified, I believe we had removed this in previous  surgery.  I palpated the area thoroughly and found no adenopathy and no nodules or abnormalities.  I then thoroughly irrigated the fascia and surrounding skin with Marcaine.  I closed the Scarpa fascia with interrupted 3-0 Vicryl sutures and closed the skin with running 4-0 Monocryl.  Steri-Strips, gauze, and tape were then applied. The patient tolerated the procedure well.  All the counts were correct at the end of the procedure.  The patient was then extubated in the operating room and taken in a stable condition to the recovery room.     [name], M.D.     DB/MEDQ  D:  08/20/2014  T:  08/20/2014  Job:  [id number]

## 2014-08-20 NOTE — Op Note (Signed)
RIGHT GROIN EXPLORATION  Procedure Note  [name] 08/20/2014   Pre-op Diagnosis: Chronic Right Groin Pain     Post-op Diagnosis: same  Procedure(s): RIGHT GROIN EXPLORATION EXCISION SCAR TISSUE REMOVAL OF MESH REPAIR DIRECT RIGHT INGUINAL HERNIA WITH MESH  Surgeon(s): Abigail Miyamotoouglas Amra Shukla, MD  Anesthesia: General  Staff:  Circulator: Milus Mallickarija N Byrley, RN Scrub Person: Illene Bolusawn Marie Lewis, CST Circulator Assistant: Mitchel HonourKristi Lynn Lawless, RN  Estimated Blood Loss: Minimal                         [name]   Date: 08/20/2014  Time: 9:49 AM
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
## 2014-08-20 NOTE — Anesthesia Postprocedure Evaluation (Signed)
  Anesthesia Post-op Note  Patient: [name]  Procedure(s) Performed: Procedure(s): RIGHT GROIN EXPLORATION (Right)  Patient Location: PACU  Anesthesia Type:General  Level of Consciousness: awake  Airway and Oxygen Therapy: Patient Spontanous Breathing  Post-op Pain: mild  Post-op Assessment: Post-op Vital signs reviewed  Post-op Vital Signs: Reviewed  Last Vitals:  Filed Vitals:   08/20/14 1051  BP: 137/79  Pulse: 61  Temp:   Resp: 12    Complications: No apparent anesthesia complications

## 2014-08-20 NOTE — Transfer of Care (Signed)
Immediate Anesthesia Transfer of Care Note  Patient: [name]  Procedure(s) Performed: Procedure(s): RIGHT GROIN EXPLORATION (Right)  Patient Location: PACU  Anesthesia Type:General  Level of Consciousness: awake, alert  and oriented  Airway & Oxygen Therapy: Patient Spontanous Breathing and Patient connected to nasal cannula oxygen  Post-op Assessment: Report given to PACU RN and Post -op Vital signs reviewed and stable  Post vital signs: Reviewed and stable  Complications: No apparent anesthesia complications

## 2014-08-21 DIAGNOSIS — Z87442 Personal history of urinary calculi: Secondary | ICD-10-CM | POA: Diagnosis not present

## 2014-08-21 DIAGNOSIS — K4090 Unilateral inguinal hernia, without obstruction or gangrene, not specified as recurrent: Secondary | ICD-10-CM | POA: Diagnosis present

## 2014-08-21 DIAGNOSIS — F17210 Nicotine dependence, cigarettes, uncomplicated: Secondary | ICD-10-CM | POA: Diagnosis present

## 2014-08-21 DIAGNOSIS — Z886 Allergy status to analgesic agent status: Secondary | ICD-10-CM | POA: Diagnosis not present

## 2014-08-21 DIAGNOSIS — G8918 Other acute postprocedural pain: Secondary | ICD-10-CM | POA: Diagnosis present

## 2014-08-21 NOTE — Progress Notes (Signed)
1 Day Post-Op  Subjective: Still with moderate to severe groin pain post op which is not suprising  Objective: Vital signs in last 24 hours: Temp:  [97.5 F (36.4 C)-98.1 F (36.7 C)] 97.9 F (36.6 C) (10/22 0550) Pulse Rate:  [52-87] 87 (10/22 0550) Resp:  [11-19] 13 (10/22 0751) BP: (88-137)/(51-84) 106/54 mmHg (10/22 0550) SpO2:  [93 %-100 %] 94 % (10/22 0751) Last BM Date: 08/20/14  Intake/Output from previous day: 10/21 0701 - 10/22 0700 In: 2541 [I.V.:2541] Out: 1775 [Urine:1775] Intake/Output this shift:    Abdomen soft, dressing dry, no hematoma  Lab Results:   Recent Labs  08/20/14 0723  WBC 7.3  HGB 16.0  HCT 46.5  PLT 185   BMET No results found for this basename: NA, K, CL, CO2, GLUCOSE, BUN, CREATININE, CALCIUM,  in the last 72 hours PT/INR No results found for this basename: LABPROT, INR,  in the last 72 hours ABG No results found for this basename: PHART, PCO2, PO2, HCO3,  in the last 72 hours  Studies/Results: No results found.  Anti-infectives: Anti-infectives   Start     Dose/Rate Route Frequency Ordered Stop   08/20/14 0600  ceFAZolin (ANCEF) IVPB 2 g/50 mL premix     2 g 100 mL/hr over 30 Minutes Intravenous On call to O.R. 08/19/14 1437 08/20/14 0821      Assessment/Plan: s/p Procedure(s): RIGHT GROIN EXPLORATION (Right)  D/c pca Adjust pain meds Home when no longer needing IV pain meds  LOS: 1 day    [name] A 08/21/2014

## 2014-08-22 ENCOUNTER — Encounter (HOSPITAL_COMMUNITY): Payer: Self-pay | Admitting: Surgery

## 2014-08-22 MED ORDER — HYDROCODONE-ACETAMINOPHEN 10-325 MG PO TABS: 1.0000 | ORAL_TABLET | ORAL | Status: DC | PRN

## 2014-08-22 MED ORDER — PROMETHAZINE HCL 12.5 MG PO TABS: 12.5000 mg | ORAL_TABLET | Freq: Four times a day (QID) | ORAL | Status: DC | PRN

## 2014-08-22 NOTE — Progress Notes (Signed)
Patient ID: [name], male   DOB: Sep 23, 1961, 53 y.o.   MRN: [id number]  Pain less Wound stable  Plan: discharge

## 2014-08-22 NOTE — Discharge Summary (Signed)
Physician Discharge Summary  Patient ID: Ozzie Hoyleimothy R Hazard MRN: [id number] DOB/AGE: [birth year] 54 y.o.  Admit date: 08/20/2014 Discharge date: 08/22/2014  Admission Diagnoses:  Discharge Diagnoses:  Active Problems:   Chronic groin pain   Discharged Condition: fair  Hospital Course: admitted post op.  Took several days to control the post op pain, discharged home POD#3  Consults: None  Significant Diagnostic Studies:   Treatments: surgery: right groin exploration  Discharge Exam: Blood pressure 106/73, pulse 81, temperature 97.8 F (36.6 C), temperature source Oral, resp. rate 18, height 6' (1.829 m), weight 187 lb (84.823 kg), SpO2 95.00%. General appearance: alert, cooperative and no distress Resp: clear to auscultation bilaterally Incision/Wound:incision clean, abdomen soft  Disposition: 01-Home or Self Care     Medication List         baclofen 10 MG tablet  Commonly known as:  LIORESAL  Take 10 mg by mouth 3 (three) times daily.     gabapentin 400 MG capsule  Commonly known as:  NEURONTIN  Take 400 mg by mouth 3 (three) times daily.     HYDROcodone-acetaminophen 10-325 MG per tablet  Commonly known as:  NORCO  Take 1-2 tablets by mouth every 4 (four) hours as needed for severe pain.     promethazine 12.5 MG tablet  Commonly known as:  PHENERGAN  Take 1 tablet (12.5 mg total) by mouth every 6 (six) hours as needed for nausea or vomiting.           Follow-up Information   [facility]. Schedule an appointment as soon as possible for a visit in 2 weeks.   Specialty:  General Surgery   Contact information:   [address] [place] [address] [phone]       Signed: Shelly RubensteinBLACKMAN,Madgeline Rayo A 08/22/2014, 8:39 AM

## 2014-08-22 NOTE — Care Management Note (Signed)
  Page 1 of 1   08/22/2014     10:27:28 AM CARE MANAGEMENT NOTE 08/22/2014  Patient:  [name],Javione R   Account Number:  [id number]  Date Initiated:  08/22/2014  Documentation initiated by:  Ronny FlurryWILE,Daemon Dowty  Subjective/Objective Assessment:     Action/Plan:   Anticipated DC Date:  08/22/2014   Anticipated DC Plan:  HOME/SELF CARE         Choice offered to / List presented to:             Status of service:  Completed, signed off Medicare Important Message given?  YES (If response is "NO", the following Medicare IM given date fields will be blank) Date Medicare IM given:  08/22/2014 Medicare IM given by:  Ronny FlurryWILE,Marlen Mollica Date Additional Medicare IM given:   Additional Medicare IM given by:    Discharge Disposition:  HOME/SELF CARE  Per UR Regulation:  Reviewed for med. necessity/level of care/duration of stay  If discussed at Long Length of Stay Meetings, dates discussed:    Comments:

## 2014-08-22 NOTE — Discharge Instructions (Signed)
[facility] _______[facility]  UMBILICAL OR INGUINAL HERNIA REPAIR: POST OP INSTRUCTIONS  Always review your discharge instruction sheet given to you by the facility where your surgery was performed. IF YOU HAVE DISABILITY OR FAMILY LEAVE FORMS, YOU MUST BRING THEM TO THE OFFICE FOR PROCESSING.   DO NOT GIVE THEM TO YOUR DOCTOR.  1. A  prescription for pain medication Braver be given to you upon discharge.  Take your pain medication as prescribed, if needed.  If narcotic pain medicine is not needed, then you Sigmon take acetaminophen (Tylenol) or ibuprofen (Advil) as needed. 2. Take your usually prescribed medications unless otherwise directed. 3. If you need a refill on your pain medication, please contact your pharmacy.  They will contact our office to request authorization. Prescriptions will not be filled after 5 pm or on week-ends. 4. You should follow a light diet the first 24 hours after arrival home, such as soup and crackers, etc.  Be sure to include lots of fluids daily.  Resume your normal diet the day after surgery. 5. Most patients will experience some swelling and bruising around the umbilicus or in the groin and scrotum.  Ice packs and reclining will help.  Swelling and bruising can take several days to resolve.  6. It is common to experience some constipation if taking pain medication after surgery.  Increasing fluid intake and taking a stool softener (such as Colace) will usually help or prevent this problem from occurring.  A mild laxative (Milk of Magnesia or Miralax) should be taken according to package directions if there are no bowel movements after 48 hours. 7. Unless discharge instructions indicate otherwise, you Mena remove your bandages 24-48 hours after surgery, and you Tri shower at that time.  You Manus have steri-strips (small skin tapes) in place directly over the incision.  These strips should be left on the skin for 7-10 days.  If your surgeon used skin glue on the  incision, you Isidoro shower in 24 hours.  The glue will flake off over the next 2-3 weeks.  Any sutures or staples will be removed at the office during your follow-up visit. 8. ACTIVITIES:  You Sandiford resume regular (light) daily activities beginning the next day--such as daily self-care, walking, climbing stairs--gradually increasing activities as tolerated.  You Mccaskill have sexual intercourse when it is comfortable.  Refrain from any heavy lifting or straining until approved by your doctor. a. You [address] drive when you are no longer taking prescription pain medication, you can comfortably wear a seatbelt, and you can safely maneuver your car and apply brakes. b. RETURN TO WORK:  __________________________________________________________ 9. You should see your doctor in the office for a follow-up appointment approximately 2-3 weeks after your surgery.  Make sure that you call for this appointment within a day or two after you arrive home to insure a convenient appointment time. 10. OTHER INSTRUCTIONS:  __________________________________________________________________________________________________________________________________________________________________________________________  WHEN TO CALL YOUR DOCTOR: 1. Fever over 101.0 2. Inability to urinate 3. Nausea and/or vomiting 4. Extreme swelling or bruising 5. Continued bleeding from incision. 6. Increased pain, redness, or drainage from the incision  The clinic staff is available to answer your questions during regular business hours.  Please don't hesitate to call and ask to speak to one of the nurses for clinical concerns.  If you have a medical emergency, go to the nearest emergency room or call 911.  A surgeon from [facility] is always on call at the hospital     [address], [address], [place], [place]  [address] ?  [address] [address], [place], [place]   [address] [phone] ? [phone] ? FAX [phone] Web site:  [email or web address]  

## 2014-08-29 ENCOUNTER — Encounter (HOSPITAL_COMMUNITY): Payer: Self-pay | Admitting: Emergency Medicine

## 2014-08-29 ENCOUNTER — Telehealth (INDEPENDENT_AMBULATORY_CARE_PROVIDER_SITE_OTHER): Payer: Self-pay | Admitting: General Surgery

## 2014-08-29 ENCOUNTER — Observation Stay (HOSPITAL_COMMUNITY)
Admission: EM | Admit: 2014-08-29 | Discharge: 2014-08-31 | Disposition: A | Discharge status: Home / Self Care | Payer: Medicare Other | Attending: Surgery | Admitting: Surgery

## 2014-08-29 DIAGNOSIS — Y838 Other surgical procedures as the cause of abnormal reaction of the patient, or of later complication, without mention of misadventure at the time of the procedure: Secondary | ICD-10-CM | POA: Diagnosis not present

## 2014-08-29 DIAGNOSIS — Y92239 Unspecified place in hospital as the place of occurrence of the external cause: Secondary | ICD-10-CM | POA: Diagnosis not present

## 2014-08-29 DIAGNOSIS — Z885 Allergy status to narcotic agent status: Secondary | ICD-10-CM | POA: Insufficient documentation

## 2014-08-29 DIAGNOSIS — S301XXA Contusion of abdominal wall, initial encounter: Secondary | ICD-10-CM

## 2014-08-29 DIAGNOSIS — Z79899 Other long term (current) drug therapy: Secondary | ICD-10-CM | POA: Insufficient documentation

## 2014-08-29 DIAGNOSIS — K91840 Postprocedural hemorrhage and hematoma of a digestive system organ or structure following a digestive system procedure: Secondary | ICD-10-CM | POA: Diagnosis present

## 2014-08-29 DIAGNOSIS — F17210 Nicotine dependence, cigarettes, uncomplicated: Secondary | ICD-10-CM | POA: Insufficient documentation

## 2014-08-29 DIAGNOSIS — L7622 Postprocedural hemorrhage and hematoma of skin and subcutaneous tissue following other procedure: Secondary | ICD-10-CM | POA: Diagnosis not present

## 2014-08-29 DIAGNOSIS — R1030 Lower abdominal pain, unspecified: Secondary | ICD-10-CM | POA: Diagnosis present

## 2014-08-29 HISTORY — DX: Unspecified thoracic, thoracolumbar and lumbosacral intervertebral disc disorder: M51.9

## 2014-08-29 LAB — URINALYSIS, ROUTINE W REFLEX MICROSCOPIC
BILIRUBIN URINE: NEGATIVE
Glucose, UA: NEGATIVE mg/dL
KETONES UR: NEGATIVE mg/dL
Leukocytes, UA: NEGATIVE
NITRITE: NEGATIVE
Protein, ur: NEGATIVE mg/dL
Specific Gravity, Urine: 1.022 (ref 1.005–1.030)
Urobilinogen, UA: 1 mg/dL (ref 0.0–1.0)
pH: 6 (ref 5.0–8.0)

## 2014-08-29 LAB — BASIC METABOLIC PANEL
Anion gap: 13 (ref 5–15)
BUN: 20 mg/dL (ref 6–23)
CHLORIDE: 102 meq/L (ref 96–112)
CO2: 22 mEq/L (ref 19–32)
Calcium: 9 mg/dL (ref 8.4–10.5)
Creatinine, Ser: 0.85 mg/dL (ref 0.50–1.35)
Glucose, Bld: 117 mg/dL — ABNORMAL HIGH (ref 70–99)
POTASSIUM: 4.4 meq/L (ref 3.7–5.3)
Sodium: 137 mEq/L (ref 137–147)

## 2014-08-29 LAB — CBC
HCT: 46.7 % (ref 39.0–52.0)
Hemoglobin: 16.2 g/dL (ref 13.0–17.0)
MCH: 33.8 pg (ref 26.0–34.0)
MCHC: 34.7 g/dL (ref 30.0–36.0)
MCV: 97.5 fL (ref 78.0–100.0)
Platelets: 254 10*3/uL (ref 150–400)
RBC: 4.79 MIL/uL (ref 4.22–5.81)
RDW: 11.7 % (ref 11.5–15.5)
WBC: 6.4 10*3/uL (ref 4.0–10.5)

## 2014-08-29 LAB — URINE MICROSCOPIC-ADD ON

## 2014-08-29 MED ORDER — HYDROMORPHONE HCL 1 MG/ML IJ SOLN
0.5000 mg | INTRAMUSCULAR | Status: DC | PRN
  Administered 2014-08-29 (×3): 1 mg via INTRAVENOUS
  Administered 2014-08-29 – 2014-08-30 (×2): 1.5 mg via INTRAVENOUS
  Filled 2014-08-29: qty 1
  Filled 2014-08-29: qty 2
  Filled 2014-08-29: qty 1
  Filled 2014-08-29: qty 2
  Filled 2014-08-29: qty 1

## 2014-08-29 MED ORDER — ONDANSETRON HCL 4 MG/2ML IJ SOLN
4.0000 mg | Freq: Once | INTRAMUSCULAR | Status: AC
  Administered 2014-08-29: 4 mg via INTRAVENOUS
  Filled 2014-08-29: qty 2

## 2014-08-29 MED ORDER — BISACODYL 10 MG RE SUPP: 10.0000 mg | Freq: Every day | RECTAL | Status: DC | PRN

## 2014-08-29 MED ORDER — PROMETHAZINE HCL 25 MG/ML IJ SOLN
12.5000 mg | Freq: Four times a day (QID) | INTRAMUSCULAR | Status: DC | PRN
  Administered 2014-08-29 (×2): 25 mg via INTRAVENOUS
  Administered 2014-08-30: 12.5 mg via INTRAVENOUS
  Administered 2014-08-30 (×2): 25 mg via INTRAVENOUS
  Filled 2014-08-29 (×5): qty 1

## 2014-08-29 MED ORDER — DIPHENHYDRAMINE HCL 50 MG/ML IJ SOLN
12.5000 mg | Freq: Four times a day (QID) | INTRAMUSCULAR | Status: DC | PRN
Start: 2014-08-29 — End: 2014-08-31

## 2014-08-29 MED ORDER — ONDANSETRON HCL 4 MG/2ML IJ SOLN
4.0000 mg | Freq: Four times a day (QID) | INTRAMUSCULAR | Status: DC | PRN
  Filled 2014-08-29: qty 2

## 2014-08-29 MED ORDER — MORPHINE SULFATE 4 MG/ML IJ SOLN
4.0000 mg | Freq: Once | INTRAMUSCULAR | Status: AC
  Administered 2014-08-29: 4 mg via INTRAVENOUS
  Filled 2014-08-29: qty 1

## 2014-08-29 MED ORDER — DOCUSATE SODIUM 100 MG PO CAPS
100.0000 mg | ORAL_CAPSULE | Freq: Two times a day (BID) | ORAL | Status: DC
  Administered 2014-08-29 – 2014-08-31 (×3): 100 mg via ORAL
  Filled 2014-08-29 (×5): qty 1

## 2014-08-29 MED ORDER — DIPHENHYDRAMINE HCL 12.5 MG/5ML PO ELIX: 12.5000 mg | ORAL_SOLUTION | Freq: Four times a day (QID) | ORAL | Status: DC | PRN

## 2014-08-29 MED ORDER — POLYETHYLENE GLYCOL 3350 17 G PO PACK
17.0000 g | PACK | Freq: Every day | ORAL | Status: DC | PRN
  Filled 2014-08-29: qty 1

## 2014-08-29 MED ORDER — OXYCODONE HCL 5 MG PO TABS
5.0000 mg | ORAL_TABLET | ORAL | Status: DC | PRN
  Administered 2014-08-29 – 2014-08-30 (×4): 10 mg via ORAL
  Filled 2014-08-29 (×5): qty 2

## 2014-08-29 MED ORDER — POTASSIUM CHLORIDE IN NACL 20-0.9 MEQ/L-% IV SOLN
INTRAVENOUS | Status: DC
  Administered 2014-08-29: 100 mL via INTRAVENOUS
  Administered 2014-08-29: 14:00:00 via INTRAVENOUS
  Administered 2014-08-30: 100 mL/h via INTRAVENOUS
  Filled 2014-08-29 (×4): qty 1000
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
## 2014-08-29 NOTE — Telephone Encounter (Signed)
Patient called in to explain that he saw Dr. Johna SheriffHoxworth in our urgent office on 10/28 for chronic pain s/p groin exploration with Dr. Magnus IvanBlackman.  It was determined that he had a hematoma.  He was given oxycodone but was also informed that if this ot worse he should call us back.  Informed the patient that if the pain is that uncontrollable then [facility].  The patient verbalized understanding and agreed with this POC.

## 2014-08-29 NOTE — Progress Notes (Signed)
  CARE MANAGEMENT ED NOTE 08/29/2014  Patient:  [name]   Account Number:  [id number]  Date Initiated:  08/29/2014  Documentation initiated by:  [name]  Subjective/Objective Assessment:   53 yr old blue medicare s/p redo repair of right inguinal hernia with new mesh and removal of scar tissue by [name] on 08/20/14. with d/c on 08/22/14 to home without need of home health services/[facility]     Subjective/Objective Assessment Detail:   d/c summary requested only f/u with Brett Magnus Wyatt in 1 week    surgeon [name] [place] [facility]  Pt and family confirmed pt saw [name] vs Brett Magnus Wyatt on Wednesday 08/27/14 for f/u appt and called 08/29/14 after continue pain  pcp [name] not seen since d/c 08/22/14 - [facility]  2 male family members at his bedside Pt alert and oriented x 3 but noted to be grimacing in pain during Cm interaction with him     Action/Plan:   ED CM noted pt for OBS admission after recent d/c from [name] Spoke with pt and family about f/u care after d/c   Action/Plan Detail:   Anticipated DC Date:  08/30/2014     Status Recommendation to Physician:   Result of Recommendation:    Other ED Services  Consult Working Plan    DC Planning Services  Other  Outpatient Services - Pt will follow up    Choice offered to / List presented to:            Status of service:  Completed, signed off  ED Comments:   ED Comments Detail:
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
## 2014-08-29 NOTE — ED Notes (Signed)
Pt had hernia surgery and stated having pain and hematoma on right abd.  Was told if got worse that he would need to be seen. kot has gotten larger and more painful. Called surgeon's office and was [facility] bc PA for surgery is on call here today.

## 2014-08-30 LAB — CBC
HEMATOCRIT: 41.9 % (ref 39.0–52.0)
HEMOGLOBIN: 14.1 g/dL (ref 13.0–17.0)
MCH: 33.5 pg (ref 26.0–34.0)
MCHC: 33.7 g/dL (ref 30.0–36.0)
MCV: 99.5 fL (ref 78.0–100.0)
Platelets: 231 10*3/uL (ref 150–400)
RBC: 4.21 MIL/uL — ABNORMAL LOW (ref 4.22–5.81)
RDW: 11.9 % (ref 11.5–15.5)
WBC: 7.3 10*3/uL (ref 4.0–10.5)

## 2014-08-30 LAB — BASIC METABOLIC PANEL
Anion gap: 8 (ref 5–15)
BUN: 14 mg/dL (ref 6–23)
CHLORIDE: 103 meq/L (ref 96–112)
CO2: 26 meq/L (ref 19–32)
CREATININE: 0.98 mg/dL (ref 0.50–1.35)
Calcium: 8.6 mg/dL (ref 8.4–10.5)
GFR calc Af Amer: >90 mL/min (ref >90)
GFR calc non Af Amer: >90 mL/min (ref >90)
GLUCOSE: 94 mg/dL (ref 70–99)
Potassium: 4.5 mEq/L (ref 3.7–5.3)
Sodium: 137 mEq/L (ref 137–147)

## 2014-08-30 MED ORDER — HYDROMORPHONE HCL 1 MG/ML IJ SOLN
0.5000 mg | INTRAMUSCULAR | Status: DC | PRN
  Administered 2014-08-30 – 2014-08-31 (×2): 1 mg via INTRAVENOUS
  Filled 2014-08-30 (×2): qty 1

## 2014-08-30 MED ORDER — HYDROMORPHONE HCL 2 MG PO TABS
2.0000 mg | ORAL_TABLET | ORAL | Status: DC | PRN
  Administered 2014-08-30 – 2014-08-31 (×5): 2 mg via ORAL
  Filled 2014-08-30 (×5): qty 1

## 2014-08-30 NOTE — Plan of Care (Signed)
Problem: Phase I Progression Outcomes Goal: Pain controlled with appropriate interventions Outcome: Not Progressing Pt said current regimen not working.  MD discussed with pt and making changes to meds. MD goal to find effective po pain regimen. Goal: OOB as tolerated unless otherwise ordered Outcome: Progressing Amb in room, but hindered by pain/nausea from amb in halls.

## 2014-08-30 NOTE — Progress Notes (Signed)
UR completed 

## 2014-08-30 NOTE — Progress Notes (Signed)
POD 10 R IHR Subjective: Pain better with IV meds, having some nausea but tolerating a diet.  Good UOP  Objective: Vital signs in last 24 hours: Temp:  [97.8 F (36.6 C)-98.1 F (36.7 C)] 98.1 F (36.7 C) (10/31 0620) Pulse Rate:  [59-80] 80 (10/31 0620) Resp:  [14-16] 16 (10/31 0620) BP: (85-121)/(53-79) 108/67 mmHg (10/31 0620) SpO2:  [93 %-99 %] 97 % (10/31 0620) Weight:  [187 lb (84.823 kg)] 187 lb (84.823 kg) (10/30 1401)   Intake/Output from previous day: 10/30 0701 - 10/31 0700 In: 1896.7 [P.O.:360; I.V.:1536.7] Out: 1550 [Urine:1550] Intake/Output this shift: Total I/O In: 240 [P.O.:240] Out: -    General appearance: alert and cooperative GI: normal findings: soft, slightly distended  Incision: swelling noted, no erythema  Lab Results:   Recent Labs  08/29/14 1045 08/30/14 0435  WBC 6.4 7.3  HGB 16.2 14.1  HCT 46.7 41.9  PLT 254 231   BMET  Recent Labs  08/29/14 1045 08/30/14 0435  NA 137 137  K 4.4 4.5  CL 102 103  CO2 22 26  GLUCOSE 117* 94  BUN 20 14  CREATININE 0.85 0.98  CALCIUM 9.0 8.6   PT/INR No results found for this basename: LABPROT, INR,  in the last 72 hours ABG No results found for this basename: PHART, PCO2, PO2, HCO3,  in the last 72 hours  MEDS, Scheduled . docusate sodium  100 mg Oral BID    Studies/Results: No results found.  Assessment: s/p  Patient Active Problem List   Diagnosis Date Noted  . Groin pain 08/29/2014  . 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 Sinusitis 11/07/2011  . NEPHROLITHIASIS 02/02/2010  . NECK PAIN, CHRONIC 02/02/2010  . ABDOMINAL PAIN, RIGHT LOWER QUADRANT 02/02/2010  . INGUINAL PAIN, RIGHT 02/02/2010    S/p IHR admitted for pain control  Plan: will add PO Dilaudid for breakthrough pain Hopefully can find an oral regimen that works better   LOS: 1 day     .Vanita PandaAlicia C Yaffa Seckman, MD [facility], [place] [phone]   08/30/2014 11:06 AM

## 2014-08-31 MED ORDER — HYDROMORPHONE HCL 2 MG PO TABS: 2.0000 mg | ORAL_TABLET | ORAL | Status: DC | PRN

## 2014-08-31 MED ORDER — OXYCODONE HCL 5 MG PO TABS: 5.0000 mg | ORAL_TABLET | ORAL | Status: DC | PRN

## 2014-08-31 NOTE — Discharge Instructions (Signed)
Hematoma A hematoma is a collection of blood under the skin, in an organ, in a body space, in a joint space, or in other tissue. The blood can clot to form a lump that you can see and feel. The lump is often firm and Martus sometimes become sore and tender. Most hematomas get better in a few days to weeks. However, some hematomas Berger be serious and require medical care. Hematomas can range in size from very small to very large. CAUSES  A hematoma can be caused by a blunt or penetrating injury. It can also be caused by spontaneous leakage from a blood vessel under the skin. Spontaneous leakage from a blood vessel is more likely to occur in older people, especially those taking blood thinners. Sometimes, a hematoma can develop after certain medical procedures. SIGNS AND SYMPTOMS   A firm lump on the body.  Possible pain and tenderness in the area.  Bruising.Blue, dark blue, purple-red, or yellowish skin Loe appear at the site of the hematoma if the hematoma is close to the surface of the skin. For hematomas in deeper tissues or body spaces, the signs and symptoms Bowler be subtle. For example, an intra-abdominal hematoma Neuharth cause abdominal pain, weakness, fainting, and shortness of breath. An intracranial hematoma Dowdle cause a headache or symptoms such as weakness, trouble speaking, or a change in consciousness. DIAGNOSIS  A hematoma can usually be diagnosed based on your medical history and a physical exam. Imaging tests Trevathan be needed if your health care provider suspects a hematoma in deeper tissues or body spaces, such as the abdomen, head, or chest. These tests Hansen include ultrasonography or a CT scan.  TREATMENT  Hematomas usually go away on their own over time. Rarely does the blood need to be drained out of the body. Large hematomas or those that Nolden affect vital organs will sometimes need surgical drainage or monitoring. HOME CARE INSTRUCTIONS   Apply ice to the injured area:   Put ice in a  plastic bag.   Place a towel between your skin and the bag.   Leave the ice on for 20 minutes, 2-3 times a day for the first 1 to 2 days.   After the first 2 days, switch to using warm compresses on the hematoma.   Elevate the injured area to help decrease pain and swelling. Wrapping the area with an elastic bandage Lia also be helpful. Compression helps to reduce swelling and promotes shrinking of the hematoma. Make sure the bandage is not wrapped too tight.   If your hematoma is on a lower extremity and is painful, crutches Bressman be helpful for a couple days.   Only take over-the-counter or prescription medicines as directed by your health care provider. SEEK IMMEDIATE MEDICAL CARE IF:   You have increasing pain, or your pain is not controlled with medicine.   You have a fever.   You have worsening swelling or discoloration.   Your skin over the hematoma breaks or starts bleeding.   Your hematoma is in your chest or abdomen and you have weakness, shortness of breath, or a change in consciousness.  Your hematoma is on your scalp (caused by a fall or injury) and you have a worsening headache or a change in alertness or consciousness. MAKE SURE YOU:   Understand these instructions.  Will watch your condition.  Will get help right away if you are not doing well or get worse. Document Released: 05/31/2004 Document Revised: 06/19/2013 Document Reviewed: 03/27/2013   ExitCare Patient Information 2015 [employer]. This information is not intended to replace advice given to you by your health care provider. Make sure you discuss any questions you have with your health care provider.  

## 2014-08-31 NOTE — Discharge Summary (Signed)
Physician Discharge Summary  Patient ID: [name] MRN: [id number] DOB/AGE: 05-16-1961 53 y.o.  Admit date: 08/29/2014 Discharge date: 08/31/2014  Admission Diagnoses: Groin pain  Discharge Diagnoses:  Active Problems:   Groin pain Hematoma  Discharged Condition: good  Hospital Course: Pt admitted for pain control.  Discharged once tolerating pain on PO regimen.   Consults: None  Significant Diagnostic Studies: labs: CBC  Treatments: IV hydration and analgesia: oxycodone, PO dilaudid  Discharge Exam: Blood pressure 108/64, pulse 68, temperature 98 F (36.7 C), temperature source Oral, resp. rate 18, height 6' (1.829 m), weight 187 lb (84.823 kg), SpO2 98 %. General appearance: alert and cooperative Incision/Wound: stable mass under incision, no erythema  Disposition: 01-Home or Self Care     Medication List    ASK your doctor about these medications        baclofen 10 MG tablet  Commonly known as:  LIORESAL  Take 10 mg by mouth 3 (three) times daily.     gabapentin 400 MG capsule  Commonly known as:  NEURONTIN  Take 400 mg by mouth 3 (three) times daily.     HYDROcodone-acetaminophen 10-325 MG per tablet  Commonly known as:  NORCO  Take 1-2 tablets by mouth every 4 (four) hours as needed for severe pain.     ibuprofen 200 MG tablet  Commonly known as:  ADVIL,MOTRIN  Take 800 mg by mouth 3 (three) times daily.     promethazine 12.5 MG tablet  Commonly known as:  PHENERGAN  Take 1 tablet (12.5 mg total) by mouth every 6 (six) hours as needed for nausea or vomiting.           Follow-up Information    Follow up with Shelly RubensteinBLACKMAN,DOUGLAS A, MD.   Specialty:  General Surgery   Why:  as scheduled   Contact information:   [address] [place] [address] [phone]       Signed: Vanita PandaHOMAS, Matin Mattioli C. 08/31/2014, 8:07 AM

## 2014-08-31 NOTE — Discharge Summary (Signed)
Reviewed discharge instructions with pt and son including follow up appointment, medication, and s/s of infection. Pt verbalized understanding of all instructions without questions.  Pt d/c into care of son.

## 2014-09-05 ENCOUNTER — Other Ambulatory Visit (INDEPENDENT_AMBULATORY_CARE_PROVIDER_SITE_OTHER): Payer: Self-pay | Admitting: Surgery

## 2014-09-05 ENCOUNTER — Inpatient Hospital Stay (HOSPITAL_COMMUNITY)
Admission: AD | Admit: 2014-09-05 | Discharge: 2014-09-07 | DRG: 921 | Disposition: A | Discharge status: Home / Self Care | Payer: Medicare Other | Source: Ambulatory Visit | Attending: Surgery | Admitting: Surgery

## 2014-09-05 ENCOUNTER — Inpatient Hospital Stay (HOSPITAL_COMMUNITY): Payer: Medicare Other

## 2014-09-05 ENCOUNTER — Encounter (HOSPITAL_COMMUNITY): Payer: Self-pay | Admitting: Radiology

## 2014-09-05 DIAGNOSIS — Z79899 Other long term (current) drug therapy: Secondary | ICD-10-CM | POA: Diagnosis not present

## 2014-09-05 DIAGNOSIS — K91840 Postprocedural hemorrhage and hematoma of a digestive system organ or structure following a digestive system procedure: Principal | ICD-10-CM | POA: Diagnosis present

## 2014-09-05 DIAGNOSIS — F17210 Nicotine dependence, cigarettes, uncomplicated: Secondary | ICD-10-CM | POA: Diagnosis present

## 2014-09-05 DIAGNOSIS — Z79891 Long term (current) use of opiate analgesic: Secondary | ICD-10-CM | POA: Diagnosis not present

## 2014-09-05 DIAGNOSIS — G8918 Other acute postprocedural pain: Secondary | ICD-10-CM | POA: Diagnosis present

## 2014-09-05 DIAGNOSIS — Y838 Other surgical procedures as the cause of abnormal reaction of the patient, or of later complication, without mention of misadventure at the time of the procedure: Secondary | ICD-10-CM | POA: Diagnosis present

## 2014-09-05 LAB — CBC
HCT: 45.7 % (ref 39.0–52.0)
HEMOGLOBIN: 15.7 g/dL (ref 13.0–17.0)
MCH: 33.2 pg (ref 26.0–34.0)
MCHC: 34.4 g/dL (ref 30.0–36.0)
MCV: 96.6 fL (ref 78.0–100.0)
Platelets: 254 10*3/uL (ref 150–400)
RBC: 4.73 MIL/uL (ref 4.22–5.81)
RDW: 11.9 % (ref 11.5–15.5)
WBC: 7.5 10*3/uL (ref 4.0–10.5)

## 2014-09-05 LAB — BASIC METABOLIC PANEL
Anion gap: 12 (ref 5–15)
BUN: 14 mg/dL (ref 6–23)
CALCIUM: 9.1 mg/dL (ref 8.4–10.5)
CHLORIDE: 102 meq/L (ref 96–112)
CO2: 25 meq/L (ref 19–32)
CREATININE: 0.9 mg/dL (ref 0.50–1.35)
GFR calc Af Amer: >90 mL/min (ref >90)
GFR calc non Af Amer: >90 mL/min (ref >90)
GLUCOSE: 89 mg/dL (ref 70–99)
Potassium: 4.2 mEq/L (ref 3.7–5.3)
Sodium: 139 mEq/L (ref 137–147)

## 2014-09-05 MED ORDER — DIPHENHYDRAMINE HCL 12.5 MG/5ML PO ELIX
12.5000 mg | ORAL_SOLUTION | Freq: Four times a day (QID) | ORAL | Status: DC | PRN
  Filled 2014-09-05: qty 5

## 2014-09-05 MED ORDER — PROMETHAZINE HCL 25 MG/ML IJ SOLN
25.0000 mg | Freq: Four times a day (QID) | INTRAMUSCULAR | Status: DC | PRN
  Administered 2014-09-05 – 2014-09-07 (×5): 25 mg via INTRAVENOUS
  Filled 2014-09-05 (×5): qty 1

## 2014-09-05 MED ORDER — ONDANSETRON HCL 4 MG/2ML IJ SOLN: 4.0000 mg | Freq: Four times a day (QID) | INTRAMUSCULAR | Status: DC | PRN

## 2014-09-05 MED ORDER — ENOXAPARIN SODIUM 40 MG/0.4ML ~~LOC~~ SOLN
40.0000 mg | SUBCUTANEOUS | Status: DC
  Administered 2014-09-05 – 2014-09-06 (×2): 40 mg via SUBCUTANEOUS
  Filled 2014-09-05 (×3): qty 0.4

## 2014-09-05 MED ORDER — ONDANSETRON HCL 4 MG/2ML IJ SOLN
4.0000 mg | Freq: Four times a day (QID) | INTRAMUSCULAR | Status: DC | PRN
  Filled 2014-09-05 (×2): qty 2

## 2014-09-05 MED ORDER — HYDROMORPHONE HCL 1 MG/ML IJ SOLN
1.0000 mg | INTRAMUSCULAR | Status: DC | PRN
  Administered 2014-09-05: 1 mg via INTRAVENOUS
  Filled 2014-09-05: qty 1

## 2014-09-05 MED ORDER — POTASSIUM CHLORIDE IN NACL 20-0.9 MEQ/L-% IV SOLN
INTRAVENOUS | Status: DC
  Administered 2014-09-05: 23:00:00 via INTRAVENOUS
  Administered 2014-09-05: 125 mL via INTRAVENOUS
  Filled 2014-09-05 (×5): qty 1000

## 2014-09-05 MED ORDER — SODIUM CHLORIDE 0.9 % IJ SOLN: 9.0000 mL | INTRAMUSCULAR | Status: DC | PRN

## 2014-09-05 MED ORDER — FENTANYL 10 MCG/ML IV SOLN
INTRAVENOUS | Status: DC
  Administered 2014-09-05: 75 ug via INTRAVENOUS
  Administered 2014-09-05: 15 ug via INTRAVENOUS
  Administered 2014-09-05: 60 ug via INTRAVENOUS
  Administered 2014-09-05: 135 ug via INTRAVENOUS
  Administered 2014-09-06: 45 ug via INTRAVENOUS
  Administered 2014-09-06: 03:00:00 via INTRAVENOUS
  Administered 2014-09-06: 45 ug via INTRAVENOUS
  Filled 2014-09-05 (×2): qty 50

## 2014-09-05 MED ORDER — IOHEXOL 300 MG/ML  SOLN
100.0000 mL | Freq: Once | INTRAMUSCULAR | Status: AC | PRN
  Administered 2014-09-05: 100 mL via INTRAVENOUS

## 2014-09-05 MED ORDER — HYDROCODONE-ACETAMINOPHEN 5-325 MG PO TABS
1.0000 | ORAL_TABLET | ORAL | Status: DC | PRN
  Administered 2014-09-06 – 2014-09-07 (×4): 2 via ORAL
  Filled 2014-09-05 (×4): qty 2

## 2014-09-05 MED ORDER — DIPHENHYDRAMINE HCL 50 MG/ML IJ SOLN
12.5000 mg | Freq: Four times a day (QID) | INTRAMUSCULAR | Status: DC | PRN
  Filled 2014-09-05 (×2): qty 1

## 2014-09-05 MED ORDER — NALOXONE HCL 0.4 MG/ML IJ SOLN: 0.4000 mg | INTRAMUSCULAR | Status: DC | PRN

## 2014-09-05 NOTE — Progress Notes (Signed)
Patient ID: [name], male   DOB: [birth year], 53 y.o.   MRN: [id number]  CT scan shows a large post op fluid collection in the RLQ abdominal wall.  I inserted an 18 gauge needle and drained 45 cc of dark seroma fluid. If this does not relieve his pain, I Mantell have to have radiology place a percutaneous drain.
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
## 2014-09-05 NOTE — Progress Notes (Signed)
Patient was admitted from outpatient  to [phone]; he was transported on the floor via wheelchair; alert and oriented x 4;  complaints of pain in right groin area; skin intact - old incision on right groin area; IV started on LFA.  Orient patient to room and unit; gave patient care guide; instructed how to use the call bell and  fall risk precautions. Will continue to monitor the patient.

## 2014-09-05 NOTE — Plan of Care (Signed)
Problem: Phase I Progression Outcomes Goal: Voiding-avoid urinary catheter unless indicated Outcome: Completed/Met Date Met:  09/05/14 Goal: Other Phase I Outcomes/Goals Outcome: Not Applicable Date Met:  40/35/24  Problem: Phase II Progression Outcomes Goal: Other Phase II Outcomes/Goals Outcome: Not Applicable Date Met:  81/85/90  Problem: Phase III Progression Outcomes Goal: Voiding independently Outcome: Completed/Met Date Met:  09/05/14 Goal: Foley discontinued Outcome: Not Applicable Date Met:  93/11/21 Goal: Other Phase III Outcomes/Goals Outcome: Not Applicable Date Met:  62/44/69  Problem: Discharge Progression Outcomes Goal: Other Discharge Outcomes/Goals Outcome: Not Applicable Date Met:  50/72/25
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
## 2014-09-06 ENCOUNTER — Encounter (HOSPITAL_COMMUNITY): Payer: Self-pay | Admitting: *Deleted

## 2014-09-06 MED ORDER — HYDROMORPHONE HCL 1 MG/ML IJ SOLN
0.5000 mg | INTRAMUSCULAR | Status: DC | PRN
  Administered 2014-09-06 (×3): 1 mg via INTRAVENOUS
  Filled 2014-09-06 (×4): qty 1

## 2014-09-06 MED ORDER — SODIUM CHLORIDE 0.9 % IV BOLUS (SEPSIS)
1000.0000 mL | Freq: Once | INTRAVENOUS | Status: AC
  Administered 2014-09-06: 1000 mL via INTRAVENOUS

## 2014-09-06 NOTE — Plan of Care (Signed)
Problem: Phase I Progression Outcomes Goal: Pain controlled with appropriate interventions Outcome: Progressing     

## 2014-09-06 NOTE — Progress Notes (Signed)
Nutrition Brief Note  Patient identified on the Malnutrition Screening Tool (MST) Report  Wt Readings from Last 15 Encounters:  09/05/14 181 lb (82.1 kg)  08/29/14 187 lb (84.823 kg)  08/20/14 187 lb (84.823 kg)  06/27/14 190 lb (86.183 kg)  05/30/14 191 lb (86.637 kg)  05/01/14 186 lb 12.8 oz (84.732 kg)  03/28/14 188 lb (85.276 kg)  03/05/14 188 lb 12.8 oz (85.639 kg)  02/11/14 189 lb (85.73 kg)  01/16/14 193 lb (87.544 kg)  11/11/13 193 lb 9.6 oz (87.816 kg)  10/17/13 191 lb 3.2 oz (86.728 kg)  09/24/13 189 lb 9.6 oz (86.002 kg)  08/20/13 187 lb (84.823 kg)  07/23/13 190 lb (86.183 kg)    Body mass index is 24.54 kg/(m^2). Patient meets criteria for normal body weight based on current BMI.   Current diet order is regular, patient is consuming approximately 75% of meals at this time. Labs and medications reviewed.   Pt reports that his appetite is poor, but says that he has been eating most of his meals. He denied the need for nutritional supplements at this time.   No nutrition interventions warranted at this time. If nutrition issues arise, please consult RD.   Emmaline KluverHaley Vitaly Wanat RD, LDN

## 2014-09-06 NOTE — Plan of Care (Signed)
Problem: Phase I Progression Outcomes Goal: OOB as tolerated unless otherwise ordered Outcome: Completed/Met Date Met:  09/06/14  Problem: Phase II Progression Outcomes Goal: IV changed to normal saline lock Outcome: Completed/Met Date Met:  09/06/14 Goal: Obtain order to discontinue catheter if appropriate Outcome: Not Applicable Date Met:  02/63/78  Problem: Discharge Progression Outcomes Goal: Pain controlled with appropriate interventions Outcome: Completed/Met Date Met:  58/85/02 Goal: Complications resolved/controlled Outcome: Not Applicable Date Met:  77/41/28 Goal: Tolerating diet Outcome: Completed/Met Date Met:  09/06/14

## 2014-09-06 NOTE — Progress Notes (Signed)
Subjective: Still with moderate pain  Objective: Vital signs in last 24 hours: Temp:  [97.9 F (36.6 C)-98.8 F (37.1 C)] 98.3 F (36.8 C) (11/07 0246) Pulse Rate:  [66-75] 75 (11/07 0246) Resp:  [11-17] 12 (11/07 0326) BP: (83-114)/(53-81) 96/58 mmHg (11/07 0246) SpO2:  [97 %-100 %] 100 % (11/07 0326) FiO2 (%):  [97 %-99 %] 97 % (11/06 2307) Weight:  [181 lb (82.1 kg)] 181 lb (82.1 kg) (11/06 1800) Last BM Date: 09/04/14  Intake/Output from previous day: 11/06 0701 - 11/07 0700 In: 879.2 [I.V.:879.2] Out: -  Intake/Output this shift: Total I/O In: 879.2 [I.V.:879.2] Out: -   Swelling and tenderness in RLQ but no erythema  Lab Results:   Recent Labs  09/05/14 1134  WBC 7.5  HGB 15.7  HCT 45.7  PLT 254   BMET  Recent Labs  09/05/14 1134  NA 139  K 4.2  CL 102  CO2 25  GLUCOSE 89  BUN 14  CREATININE 0.90  CALCIUM 9.1   PT/INR No results for input(s): LABPROT, INR in the last 72 hours. ABG No results for input(s): PHART, HCO3 in the last 72 hours.  Invalid input(s): PCO2, PO2  Studies/Results: Ct Abdomen Pelvis W Contrast  09/05/2014   CLINICAL DATA:  Right groin surgery 16 days ago. Patient has had right groin pain and swelling since surgery.  EXAM: CT ABDOMEN AND PELVIS WITH CONTRAST  TECHNIQUE: Multidetector CT imaging of the abdomen and pelvis was performed using the standard protocol following bolus administration of intravenous contrast.  CONTRAST:  100mL OMNIPAQUE IOHEXOL 300 MG/ML  SOLN  COMPARISON:  07/05/2013  FINDINGS: There is a fluid collection in the anterior abdominal wall above the inguinal canal on the right. It measures 8.5 cm from superior to inferior by 7.6 cm x 2 cm transversely. There is a small bubble of air along its superior margin. It is closely approximated to the anterior inferior right rectus abdominus muscle and the right inguinal fascia. There is no defect in the fascia. There is no hernia.  There is opacity dependently in  both lower lobes consistent with atelectasis. Milder subsegmental atelectasis is noted in the left upper lobe lingula. No pulmonary edema. Heart is normal in size.  Liver, spleen, gallbladder, pancreas, adrenal glands: Normal. Small stone noted in the lower pole of the right kidney on the prior study is not evident currently. Kidneys are normal. No hydronephrosis. Normal ureters. Normal bladder.  Atherosclerotic changes are noted along the abdominal aorta and its branch vessels. No significant stenosis. No aneurysm.  No pathologically enlarged lymph nodes.  No ascites.  Colon and small bowel are unremarkable. Normal appendix is visualized.  No significant bony abnormality.  IMPRESSION: 1. 8.5 cm fluid collection in the right inguinal region, along the superficial margin of the inguinal fascia and right rectus abdominus muscle. The fluid collection contains a single bubble of air. There is no associated defect in the abdominal wall fascia. Specifically, no evidence of a hernia. There is no communication to underlying bowel. This Carollo reflect a sterile postoperative seroma. It Hunke reflect an abscess. 2. No other acute findings. 3. Dependent lung base opacity most consistent with atelectasis. 4. Small intrarenal stone noted on the left on the prior study is not evident currently. 5. Stable atherosclerotic changes along and abdominal aorta and its branch vessels.   Electronically Signed   By: Amie Portlandavid  Ormond M.D.   On: 09/05/2014 20:04    Anti-infectives: Anti-infectives    None  Assessment/Plan: Postop seroma/hematoma  Continue pain control.  Will aspirate again tomorrow.  Glandon need percutaneous drain.  LOS: 1 day    Donabelle Molden A 09/06/2014

## 2014-09-06 NOTE — Progress Notes (Signed)
Fentanyl order D/C. PCA wasted -  in the skin with Shinita RN witness

## 2014-09-06 NOTE — Progress Notes (Addendum)
Wasted 60mcg (6cc) of fentanyl with Volanda NapoleonNikki Lelia Jons, RN

## 2014-09-07 MED ORDER — HYDROCODONE-ACETAMINOPHEN 10-325 MG PO TABS: 1.0000 | ORAL_TABLET | Freq: Four times a day (QID) | ORAL | Status: DC | PRN

## 2014-09-07 MED ORDER — PROMETHAZINE HCL 12.5 MG PO TABS: 12.5000 mg | ORAL_TABLET | Freq: Four times a day (QID) | ORAL | Status: DC | PRN

## 2014-09-07 NOTE — Progress Notes (Signed)
Patient ID: [name], male   DOB: 1961-09-30, 53 y.o.   MRN: [id number]  Having less pain I drained 40 more cc's of seroma fluid  Plan: discharge

## 2014-09-07 NOTE — Discharge Instructions (Signed)
Resume current activity level  Ok to shower

## 2014-09-07 NOTE — Plan of Care (Signed)
Problem: Consults Goal: General Medical Patient Education See Patient Education Module for specific education.  Outcome: Completed/Met Date Met:  09/07/14 Goal: Skin Care Protocol Initiated - if Braden Score 18 or less If consults are not indicated, leave blank or document N/A  Outcome: Not Applicable Date Met:  02/66/91 Goal: Nutrition Consult-if indicated Outcome: Not Applicable Date Met:  67/56/12 Goal: Diabetes Guidelines if Diabetic/Glucose > 140 If diabetic or lab glucose is > 140 mg/dl - Initiate Diabetes/Hyperglycemia Guidelines & Document Interventions  Outcome: Not Applicable Date Met:  54/83/23  Problem: Phase I Progression Outcomes Goal: Pain controlled with appropriate interventions Outcome: Completed/Met Date Met:  09/07/14 Goal: Initial discharge plan identified Outcome: Completed/Met Date Met:  09/07/14 Goal: Hemodynamically stable Outcome: Completed/Met Date Met:  09/07/14  Problem: Phase II Progression Outcomes Goal: Progress activity as tolerated unless otherwise ordered Outcome: Completed/Met Date Met:  09/07/14 Goal: Discharge plan established Outcome: Completed/Met Date Met:  09/07/14 Goal: Vital signs remain stable Outcome: Completed/Met Date Met:  09/07/14  Problem: Phase III Progression Outcomes Goal: Pain controlled on oral analgesia Outcome: Completed/Met Date Met:  09/07/14 Goal: Activity at appropriate level-compared to baseline (UP IN CHAIR FOR HEMODIALYSIS)  Outcome: Completed/Met Date Met:  09/07/14 Goal: IV/normal saline lock discontinued Outcome: Completed/Met Date Met:  09/07/14 Goal: Discharge plan remains appropriate-arrangements made Outcome: Completed/Met Date Met:  09/07/14  Problem: Discharge Progression Outcomes Goal: Discharge plan in place and appropriate Outcome: Completed/Met Date Met:  09/07/14 Goal: Hemodynamically stable Outcome: Completed/Met Date Met:  09/07/14 Goal: Activity appropriate for discharge plan Outcome:  Completed/Met Date Met:  09/07/14

## 2014-09-07 NOTE — Discharge Summary (Signed)
Physician Discharge Summary  Patient ID: [name] MRN: [id number] DOB/AGE: [birth year] 54 y.o.  Admit date: 09/05/2014 Discharge date: 09/07/2014  Admission Diagnoses:  Discharge Diagnoses:  Active Problems:   Postoperative pain seroma  Discharged Condition: good  Hospital Course: admitted with severe groin post op pain.  CT scan showed large seroma.  Perc drained at bed side x2.  Pain improved.  Discharged home  Consults: None  Significant Diagnostic Studies:   Treatments: pain meds  Discharge Exam: Blood pressure 125/105, pulse 60, temperature 97.7 F (36.5 C), temperature source Oral, resp. rate 18, height 6' (1.829 m), weight 181 lb (82.1 kg), SpO2 99 %. General appearance: alert, cooperative and no distress Incision/Wound: less right groin swelling, no erythema  Disposition: 01-Home or Self Care     Medication List    STOP taking these medications        oxyCODONE 5 MG immediate release tablet  Commonly known as:  Oxy IR/ROXICODONE      TAKE these medications        baclofen 10 MG tablet  Commonly known as:  LIORESAL  Take 10 mg by mouth 3 (three) times daily.     gabapentin 400 MG capsule  Commonly known as:  NEURONTIN  Take 400 mg by mouth 3 (three) times daily.     HYDROcodone-acetaminophen 10-325 MG per tablet  Commonly known as:  NORCO  Take 1-2 tablets by mouth every 6 (six) hours as needed for moderate pain or severe pain.     HYDROmorphone 2 MG tablet  Commonly known as:  DILAUDID  Take 1 tablet (2 mg total) by mouth every 4 (four) hours as needed (breakthrough pain).     ibuprofen 200 MG tablet  Commonly known as:  ADVIL,MOTRIN  Take 800 mg by mouth 3 (three) times daily.     oxyCODONE-acetaminophen 7.5-325 MG per tablet  Commonly known as:  PERCOCET  Take 2 tablets by mouth 3 (three) times daily as needed.     promethazine 12.5 MG tablet  Commonly known as:  PHENERGAN  Take 1 tablet (12.5 mg total) by mouth every 6 (six) hours as  needed for nausea or vomiting.           Follow-up Information    [facility]. Schedule an appointment as soon as possible for a visit on 09/10/2014.   Specialty:  General Surgery   [facility]  [facility]   Contact information:   [address] [place] [address] [phone]       Signed: Shelly RubensteinBLACKMAN,Doretta Remmert A 09/07/2014, 7:50 AM

## 2014-09-07 NOTE — Progress Notes (Signed)
Patient was discharged home by MD order; discharged instructions review and give to patient with care notes and prescriptions; IV DIC; skin intact; patient will be escorted to the car by nurse tech via wheelchair.  

## 2014-09-08 ENCOUNTER — Telehealth (INDEPENDENT_AMBULATORY_CARE_PROVIDER_SITE_OTHER): Payer: Self-pay

## 2014-11-07 ENCOUNTER — Encounter (HOSPITAL_COMMUNITY): Payer: Self-pay | Admitting: *Deleted

## 2014-11-07 ENCOUNTER — Inpatient Hospital Stay (HOSPITAL_COMMUNITY)
Admission: AD | Admit: 2014-11-07 | Discharge: 2014-11-08 | DRG: 730 | Disposition: A | Discharge status: Home / Self Care | Payer: Medicare Other | Source: Ambulatory Visit | Attending: Surgery | Admitting: Surgery

## 2014-11-07 ENCOUNTER — Inpatient Hospital Stay (HOSPITAL_COMMUNITY): Payer: Medicare Other

## 2014-11-07 ENCOUNTER — Other Ambulatory Visit (INDEPENDENT_AMBULATORY_CARE_PROVIDER_SITE_OTHER): Payer: Self-pay | Admitting: Surgery

## 2014-11-07 DIAGNOSIS — Z888 Allergy status to other drugs, medicaments and biological substances status: Secondary | ICD-10-CM

## 2014-11-07 DIAGNOSIS — Z885 Allergy status to narcotic agent status: Secondary | ICD-10-CM

## 2014-11-07 DIAGNOSIS — N508 Other specified disorders of male genital organs: Secondary | ICD-10-CM | POA: Diagnosis present

## 2014-11-07 DIAGNOSIS — Z720 Tobacco use: Secondary | ICD-10-CM | POA: Diagnosis not present

## 2014-11-07 DIAGNOSIS — N50819 Testicular pain, unspecified: Secondary | ICD-10-CM | POA: Diagnosis present

## 2014-11-07 DIAGNOSIS — Z87442 Personal history of urinary calculi: Secondary | ICD-10-CM

## 2014-11-07 DIAGNOSIS — G5781 Other specified mononeuropathies of right lower limb: Secondary | ICD-10-CM | POA: Diagnosis present

## 2014-11-07 DIAGNOSIS — Z9889 Other specified postprocedural states: Secondary | ICD-10-CM

## 2014-11-07 DIAGNOSIS — Z79899 Other long term (current) drug therapy: Secondary | ICD-10-CM

## 2014-11-07 DIAGNOSIS — M5134 Other intervertebral disc degeneration, thoracic region: Secondary | ICD-10-CM | POA: Diagnosis present

## 2014-11-07 DIAGNOSIS — R51 Headache: Secondary | ICD-10-CM | POA: Diagnosis present

## 2014-11-07 DIAGNOSIS — R52 Pain, unspecified: Secondary | ICD-10-CM

## 2014-11-07 LAB — CBC
HCT: 44.3 % (ref 39.0–52.0)
Hemoglobin: 14.6 g/dL (ref 13.0–17.0)
MCH: 32.4 pg (ref 26.0–34.0)
MCHC: 33 g/dL (ref 30.0–36.0)
MCV: 98.2 fL (ref 78.0–100.0)
PLATELETS: 267 10*3/uL (ref 150–400)
RBC: 4.51 MIL/uL (ref 4.22–5.81)
RDW: 12.8 % (ref 11.5–15.5)
WBC: 6.5 10*3/uL (ref 4.0–10.5)

## 2014-11-07 LAB — BASIC METABOLIC PANEL
ANION GAP: 3 — AB (ref 5–15)
BUN: 15 mg/dL (ref 6–23)
CO2: 26 mmol/L (ref 19–32)
Calcium: 8.7 mg/dL (ref 8.4–10.5)
Chloride: 109 mEq/L (ref 96–112)
Creatinine, Ser: 0.91 mg/dL (ref 0.50–1.35)
GFR calc Af Amer: >90 mL/min (ref >90)
GFR calc non Af Amer: >90 mL/min (ref >90)
GLUCOSE: 92 mg/dL (ref 70–99)
Potassium: 4.2 mmol/L (ref 3.5–5.1)
SODIUM: 138 mmol/L (ref 135–145)

## 2014-11-07 MED ORDER — PANTOPRAZOLE SODIUM 40 MG IV SOLR
40.0000 mg | Freq: Every day | INTRAVENOUS | Status: DC
Start: 2014-11-07 — End: 2014-11-08
  Administered 2014-11-07: 40 mg via INTRAVENOUS
  Filled 2014-11-07 (×2): qty 40

## 2014-11-07 MED ORDER — PROMETHAZINE HCL 25 MG/ML IJ SOLN
25.0000 mg | Freq: Four times a day (QID) | INTRAMUSCULAR | Status: DC | PRN
  Administered 2014-11-07 – 2014-11-08 (×3): 25 mg via INTRAVENOUS
  Filled 2014-11-07 (×3): qty 1

## 2014-11-07 MED ORDER — ONDANSETRON HCL 4 MG/2ML IJ SOLN: 4.0000 mg | Freq: Four times a day (QID) | INTRAMUSCULAR | Status: DC | PRN

## 2014-11-07 MED ORDER — SODIUM CHLORIDE 0.9 % IJ SOLN: 9.0000 mL | INTRAMUSCULAR | Status: DC | PRN

## 2014-11-07 MED ORDER — FENTANYL 10 MCG/ML IV SOLN
INTRAVENOUS | Status: DC
  Administered 2014-11-07: 11:00:00 via INTRAVENOUS
  Administered 2014-11-07: 120 ug via INTRAVENOUS
  Administered 2014-11-07: 60 ug via INTRAVENOUS
  Administered 2014-11-07: 30 ug via INTRAVENOUS
  Administered 2014-11-08: 60 ug via INTRAVENOUS
  Administered 2014-11-08: 90 ug via INTRAVENOUS
  Administered 2014-11-08: 74.71 ug via INTRAVENOUS
  Administered 2014-11-08: 30 ug via INTRAVENOUS
  Filled 2014-11-07 (×2): qty 50

## 2014-11-07 MED ORDER — OXYCODONE HCL 5 MG PO TABS
10.0000 mg | ORAL_TABLET | ORAL | Status: DC | PRN
  Administered 2014-11-07: 10 mg via ORAL
  Filled 2014-11-07: qty 2

## 2014-11-07 MED ORDER — DIPHENHYDRAMINE HCL 12.5 MG/5ML PO ELIX: 12.5000 mg | ORAL_SOLUTION | Freq: Four times a day (QID) | ORAL | Status: DC | PRN

## 2014-11-07 MED ORDER — NALOXONE HCL 0.4 MG/ML IJ SOLN: 0.4000 mg | INTRAMUSCULAR | Status: DC | PRN

## 2014-11-07 MED ORDER — POTASSIUM CHLORIDE IN NACL 20-0.9 MEQ/L-% IV SOLN
INTRAVENOUS | Status: DC
  Administered 2014-11-07: 1000 mL via INTRAVENOUS
  Administered 2014-11-08: 01:00:00 via INTRAVENOUS
  Filled 2014-11-07 (×4): qty 1000

## 2014-11-07 MED ORDER — DIPHENHYDRAMINE HCL 50 MG/ML IJ SOLN: 12.5000 mg | Freq: Four times a day (QID) | INTRAMUSCULAR | Status: DC | PRN

## 2014-11-07 NOTE — Progress Notes (Signed)
Patient came to unit at 1000,a direct admit from the office. Alert and orientedx4. Patient c/o pain to R groin,R testicle, pain level of 9/10,sharp,dull,constant pain. Placed patient comfortably in bed. Will continue to monitor the patient.- Hulda Marinonna Kammy Klett RN
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
## 2014-11-07 NOTE — Progress Notes (Signed)
Patient ID: [name], male   DOB: 1961/01/23, 54 y.o.   MRN: [id number]   I appreciate Dr. Imelda Pillowannenbaum's evaluation of [name].  He and his wife are leaning toward orchiectomy with removal of the cord to see if this will help with his pain.  Again, I explained the risks as outlined by Dr. Patsi Searsannenbaum. They will make their decision and we Troiani operate on Monday depending on the OR schedule as well as our own schedules.
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
## 2014-11-08 LAB — TESTOSTERONE: TESTOSTERONE: 152 ng/dL — AB (ref 300–890)

## 2014-11-08 NOTE — Progress Notes (Signed)
Wasted Fentanyl 32 ml of 10 mcg/ml for a total of 320 mcg in sink with Allayne ButcherKenneth Miller RN.

## 2014-11-08 NOTE — Progress Notes (Signed)
Subjective: Still having pain in testicle but it seems a little better today  Objective: Vital signs in last 24 hours: Temp:  [97.4 F (36.3 C)-98.8 F (37.1 C)] 98.1 F (36.7 C) (01/09 0625) Pulse Rate:  [59-97] 66 (01/09 0625) Resp:  [10-20] 15 (01/09 0755) BP: (110-140)/(64-95) 117/83 mmHg (01/09 0625) SpO2:  [94 %-98 %] 96 % (01/09 0755) Weight:  [182 lb 1.6 oz (82.6 kg)] 182 lb 1.6 oz (82.6 kg) (01/08 1024) Last BM Date: 11/07/14  Intake/Output from previous day: 01/08 0701 - 01/09 0700 In: 1647 [P.O.:240; I.V.:1407] Out: 2125 [Urine:2125] Intake/Output this shift: Total I/O In: -  Out: 325 [Urine:325]  Male genitalia: tender testicle  Lab Results:   Recent Labs  11/07/14 1158  WBC 6.5  HGB 14.6  HCT 44.3  PLT 267   BMET  Recent Labs  11/07/14 1158  NA 138  K 4.2  CL 109  CO2 26  GLUCOSE 92  BUN 15  CREATININE 0.91  CALCIUM 8.7   PT/INR No results for input(s): LABPROT, INR in the last 72 hours. ABG No results for input(s): PHART, HCO3 in the last 72 hours.  Invalid input(s): PCO2, PO2  Studies/Results: US Scrotum  11/07/2014   CLINICAL DATA:  Right side testicle pain 1.5 weeks. Right-sided hernia surgery 2 months ago complicated by hematoma and postoperative seroma requiring drainage procedure.  EXAM: ULTRASOUND OF SCROTUM  TECHNIQUE: Complete ultrasound examination of the testicles, epididymis, and other scrotal structures was performed.  COMPARISON:  CT abdomen and pelvis with contrast 09/05/2014.  FINDINGS: Right testicle  Measurements: 4.8 x 2.9 x 3.4 cm. No mass or microlithiasis visualized.  Left testicle  Measurements: 2.8 x 1.5 x 2.6 cm. No mass or microlithiasis visualized.  Right epididymis:  Normal in size and appearance.  Left epididymis: A prominent epididymal cyst measures 1.6 x 1.6 x 1.5 cm. There is no hyperemia or significant complexity to the collection.  Hydrocele: A moderate right hydrocele is mostly simple fluid. There are a  few scattered echoes. No significant septations are present.  Varicocele:  A small left-sided varicocele is present.  IMPRESSION: 1. Normal appearance of the testicles bilaterally. 2. Moderate right-sided hydrocele is mostly simple fluid. 3. Prominent left-sided epididymal cyst measures 1.6 x 1.6 x 1.5 cm. 4. Left-sided varicocele.   Electronically Signed   By: Gennette Pac M.D.   On: 11/07/2014 11:38   [place] Art/ven Flow Abd Pelv Doppler  11/07/2014   CLINICAL DATA:  Right side testicle pain 1.5 weeks. Right-sided hernia surgery 2 months ago complicated by hematoma and postoperative seroma requiring drainage procedure.  EXAM: ULTRASOUND OF SCROTUM  TECHNIQUE: Complete ultrasound examination of the testicles, epididymis, and other scrotal structures was performed.  COMPARISON:  CT abdomen and pelvis with contrast 09/05/2014.  FINDINGS: Right testicle  Measurements: 4.8 x 2.9 x 3.4 cm. No mass or microlithiasis visualized.  Left testicle  Measurements: 2.8 x 1.5 x 2.6 cm. No mass or microlithiasis visualized.  Right epididymis:  Normal in size and appearance.  Left epididymis: A prominent epididymal cyst measures 1.6 x 1.6 x 1.5 cm. There is no hyperemia or significant complexity to the collection.  Hydrocele: A moderate right hydrocele is mostly simple fluid. There are a few scattered echoes. No significant septations are present.  Varicocele:  A small left-sided varicocele is present.  IMPRESSION: 1. Normal appearance of the testicles bilaterally. 2. Moderate right-sided hydrocele is mostly simple fluid. 3. Prominent left-sided epididymal cyst measures 1.6 x 1.6 x 1.5  cm. 4. Left-sided varicocele.   Electronically Signed   By: Gennette Pachris  Mattern M.D.   On: 11/07/2014 11:38    Anti-infectives: Anti-infectives    None      Assessment/Plan: s/p * No surgery found * Pt is not sure he wants to have testicle removed monday. If so he can go home once pain is at a tolerable level  LOS: 1 day    TOTH III,Sebrina Kessner  S 11/08/2014

## 2014-11-08 NOTE — Progress Notes (Signed)
Patient discharged to home with wife, discharge instructions reviewed with patient who verbalized understanding. New RX given to patient. 

## 2014-11-08 NOTE — Progress Notes (Signed)
Subjective: Patient reports some improvement in his discomfort overnight. He is undecided at this time as to whether he wants to proceed with inguinal exploration and orchiectomy to try to manage his chronic discomfort. He is to have additional discussion with Dr. Patsi Sears.  Objective: Vital signs in last 24 hours: Temp:  [97.4 F (36.3 C)-98.8 F (37.1 C)] 98.1 F (36.7 C) (01/09 0625) Pulse Rate:  [59-97] 66 (01/09 0625) Resp:  [10-20] 15 (01/09 0755) BP: (110-140)/(64-95) 117/83 mmHg (01/09 0625) SpO2:  [94 %-98 %] 96 % (01/09 0755) Weight:  [82.6 kg (182 lb 1.6 oz)] 82.6 kg (182 lb 1.6 oz) (01/08 1024)  Intake/Output from previous day: 01/08 0701 - 01/09 0700 In: 1647 [P.O.:240; I.V.:1407] Out: 2125 [Urine:2125] Intake/Output this shift: Total I/O In: -  Out: 325 [Urine:325]  Physical Exam:  Constitutional: Vital signs reviewed. WD WN in NAD   Eyes: PERRL, No scleral icterus.   Cardiovascular: RRR Pulmonary/Chest: Normal effort    Lab Results:  Recent Labs  11/07/14 1158  HGB 14.6  HCT 44.3   BMET  Recent Labs  11/07/14 1158  NA 138  K 4.2  CL 109  CO2 26  GLUCOSE 92  BUN 15  CREATININE 0.91  CALCIUM 8.7   No results for input(s): LABPT, INR in the last 72 hours. No results for input(s): LABURIN in the last 72 hours. No results found for this or any previous visit.  Studies/Results: US Scrotum  11/07/2014   CLINICAL DATA:  Right side testicle pain 1.5 weeks. Right-sided hernia surgery 2 months ago complicated by hematoma and postoperative seroma requiring drainage procedure.  EXAM: ULTRASOUND OF SCROTUM  TECHNIQUE: Complete ultrasound examination of the testicles, epididymis, and other scrotal structures was performed.  COMPARISON:  CT abdomen and pelvis with contrast 09/05/2014.  FINDINGS: Right testicle  Measurements: 4.8 x 2.9 x 3.4 cm. No mass or microlithiasis visualized.  Left testicle  Measurements: 2.8 x 1.5 x 2.6 cm. No mass or  microlithiasis visualized.  Right epididymis:  Normal in size and appearance.  Left epididymis: A prominent epididymal cyst measures 1.6 x 1.6 x 1.5 cm. There is no hyperemia or significant complexity to the collection.  Hydrocele: A moderate right hydrocele is mostly simple fluid. There are a few scattered echoes. No significant septations are present.  Varicocele:  A small left-sided varicocele is present.  IMPRESSION: 1. Normal appearance of the testicles bilaterally. 2. Moderate right-sided hydrocele is mostly simple fluid. 3. Prominent left-sided epididymal cyst measures 1.6 x 1.6 x 1.5 cm. 4. Left-sided varicocele.   Electronically Signed   By: Gennette Pac M.D.   On: 11/07/2014 11:38   [place] Art/ven Flow Abd Pelv Doppler  11/07/2014   CLINICAL DATA:  Right side testicle pain 1.5 weeks. Right-sided hernia surgery 2 months ago complicated by hematoma and postoperative seroma requiring drainage procedure.  EXAM: ULTRASOUND OF SCROTUM  TECHNIQUE: Complete ultrasound examination of the testicles, epididymis, and other scrotal structures was performed.  COMPARISON:  CT abdomen and pelvis with contrast 09/05/2014.  FINDINGS: Right testicle  Measurements: 4.8 x 2.9 x 3.4 cm. No mass or microlithiasis visualized.  Left testicle  Measurements: 2.8 x 1.5 x 2.6 cm. No mass or microlithiasis visualized.  Right epididymis:  Normal in size and appearance.  Left epididymis: A prominent epididymal cyst measures 1.6 x 1.6 x 1.5 cm. There is no hyperemia or significant complexity to the collection.  Hydrocele: A moderate right hydrocele is mostly simple fluid. There are a few scattered  echoes. No significant septations are present.  Varicocele:  A small left-sided varicocele is present.  IMPRESSION: 1. Normal appearance of the testicles bilaterally. 2. Moderate right-sided hydrocele is mostly simple fluid. 3. Prominent left-sided epididymal cyst measures 1.6 x 1.6 x 1.5 cm. 4. Left-sided varicocele.   Electronically Signed    By: Gennette Pachris  Mattern M.D.   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 He has had some improvement with previous attempts at cord block. If the patient's pain really resolves with cord block then he has a substantially higher likelihood of being pain-free with orchiectomy. If there is little to no improvement with a cord block then it Schuessler not be of much benefit to the patient. It does not appear that he is can be ready to make a definitive decision about management by early next week and I would encourage him to give this some additional thought. He will discuss things further with Dr. Rayburn MaBlackmon and Dr. Patsi Searsannenbaum.   LOS: 1 day   Jovi Alvizo S 11/08/2014, 8:46 AM

## 2014-12-24 NOTE — Discharge Summary (Signed)
Physician Discharge Summary  Patient ID: [name] MRN: [id number] DOB/AGE: [birth year] 54 y.o.  Admit date: 11/07/2014 Discharge date: 12/24/2014  Admission Diagnoses:  Discharge Diagnoses:  Active Problems:   Testicle pain post operative severe right groin pain  Discharged Condition: fair  Hospital Course: He was admitted from the office for severe post op right groin and testicular pain.  Ultrasound showed good blood flow to the right testicle.  Urology was consulted.  Right orchiectomy with removal of the cord was discussed, but the patient decided to hold as his pain improved.  He was discharged the next day with his pain controlled  Consults: urology  Significant Diagnostic Studies:   Treatments: pain control  Discharge Exam: Blood pressure 116/72, pulse 76, temperature 98.2 F (36.8 C), temperature source Oral, resp. rate 18, height 6' (1.829 m), weight 182 lb 1.6 oz (82.6 kg), SpO2 97 %. General appearance: alert, cooperative and no distress Resp: clear to auscultation bilaterally GI: soft, non-tender; bowel sounds normal; no masses,  no organomegaly right groin and scrotum with mild swelling  Disposition: 01-Home or Self Care     Medication List    ASK your doctor about these medications        azithromycin 250 MG tablet  Commonly known as:  ZITHROMAX  Take 250-500 mg by mouth as directed. For 5 days     baclofen 10 MG tablet  Commonly known as:  LIORESAL  Take 10 mg by mouth 3 (three) times daily.     gabapentin 400 MG capsule  Commonly known as:  NEURONTIN  Take 400 mg by mouth 2 (two) times daily.     HYDROcodone-acetaminophen 10-325 MG per tablet  Commonly known as:  NORCO  Take 1-2 tablets by mouth every 6 (six) hours as needed for moderate pain or severe pain.     HYDROmorphone 2 MG tablet  Commonly known as:  DILAUDID  Take 1 tablet (2 mg total) by mouth every 4 (four) hours as needed (breakthrough pain).     oxyCODONE-acetaminophen 7.5-325  MG per tablet  Commonly known as:  PERCOCET  Take 1-2 tablets by mouth 3 (three) times daily as needed for pain.     promethazine 12.5 MG tablet  Commonly known as:  PHENERGAN  Take 1 tablet (12.5 mg total) by mouth every 6 (six) hours as needed for nausea or vomiting.         SignedAbigail Miyamoto: Zadkiel Dragan A 12/24/2014, 7:18 PM

## 2015-02-20 NOTE — Op Note (Signed)
PATIENT NAME:  [name], [name]#:  [id number] DATE OF BIRTH:  16-Feb-1961  DATE OF PROCEDURE:  03/13/2013  PREOPERATIVE DIAGNOSES:  1.  Displaced unstable comminuted left distal radius fracture. 2.  Acute posttraumatic left carpal tunnel syndrome.   POSTOPERATIVE DIAGNOSES: 1.  Displaced unstable comminuted left distal radius fracture. 2.  Acute posttraumatic left carpal tunnel syndrome.   OPERATION:  1.  Open reduction internal fixation left distal radius fracture.  2.  Open left carpal tunnel release.   SURGEON: Valinda HoarHoward E. Zain Lankford.  ANESTHESIA: General.   COMPLICATIONS: None.   DRAINS: None.   ESTIMATED BLOOD LOSS: None. Replaced: None.   DESCRIPTION OF PROCEDURE: The patient was brought to the operating room where he underwent satisfactory general LMA anesthesia in the supine position. The left arm was prepped and draped in sterile fashion. Esmarch was applied and the tourniquet inflated to 250 mmHg. Tourniquet time was 74 minutes. An extensile incision was made from the left palm angling across the wrist and proximally over the flexor carpi radialis. Dissection was carried out bluntly through subcutaneous tissue. The fascia was identified proximally and median nerve identified. There was significant bruising and hemorrhage here. Dissection was carried out down to the wrist and the Kelly clamp passed beneath the volar carpal ligament to protect the nerve and the volar carpal ligament was released completely. This freed up the median nerve completely. It was bruised and hemorrhagic. It was freed up from adhesions with a small mosquito clamp. The sensory branch was identified and protected. Next, blunt dissection was carried out to expose the volar aspect of the radius. The flexor muscles were all retracted ulnarly and the neurovascular bundle radially. The volar aspect of the radius was identified and the supinator was released radially. Traction was applied to the fingers through finger trap  weights and the wound was irrigated. The fracture was reduced manually and examined under fluoroscopy and seemed to be in good position. It was then fixed with standard length hand innovations volar radial plate with 5 screws distally and 3 screws proximally. The wound was then irrigated and the skin was closed with 4-0 nylon suture. Marcaine 0.5% was placed in the wound. A dry sterile compression hand dressing and volar splint was applied. The tourniquet was deflated with good return of blood flow to the hand. The patient was awakened and taken to recovery in good condition.   ____________________________ Valinda HoarHoward E. Noelene Gang, MD hem:aw D: 03/20/2013 11:17:20 ET T: 03/20/2013 11:28:58 ET JOB#: [id number]  cc: Valinda HoarHoward E. Morgen Ritacco, MD, <Dictator> Valinda HoarHOWARD E Sharae Zappulla MD ELECTRONICALLY SIGNED 03/21/2013 17:10

## 2015-02-21 NOTE — Discharge Summary (Signed)
PATIENT NAME:  Brett FearingMAY, [name] DATE OF BIRTH:  [birth year]  DATE OF ADMISSION:  04/08/2014 DATE OF DISCHARGE:  04/09/2014  DISCHARGE DIAGNOSES: 1.  Atypical chest pain.  2.  Cervical disk disease with spasm.   DISCHARGE MEDICATIONS:  Hydrocodone 10/325 b.i.d. p.r.n. pain, baclofen 10 mg t.i.d.   REASON FOR ADMISSION: A 54 year old male presents with severe chest pain and dyspnea. Please see H and P for HPI, past medical history and physical exam.   HOSPITAL COURSE: The patient was admitted. Chest CT and brain CT did not show pathology. Cardiac enzymes were normal x 3. Stress Myoview showed no signs of ischemia. It became clear that his symptoms were related to cervical and upper thoracic spasm. His pain was finally controlled with IV Dilaudid, tizanidine and Xanax. Baclofen has worked in the past and he was discharged on that to follow up with Dr. Hyacinth MeekerMiller.   ____________________________ Danella PentonMark F. Miller, MD mfm:dmm D: 04/09/2014 17:35:27 ET T: 04/09/2014 20:00:50 ET JOB#: [id number]  cc: Danella PentonMark F. Miller, MD, <Dictator> Danella PentonMARK F MILLER MD ELECTRONICALLY SIGNED 04/11/2014 8:26

## 2015-02-21 NOTE — H&P (Signed)
PATIENT NAME:  [name], [name] MR#:  [id number] DATE OF BIRTH:  June 17, 1961  DATE OF ADMISSION:  04/08/2014  A 54 year old male presents with chest pain. I saw him in the office approximately 3 days ago with back and neck pain radiating around to his anterior chest. The pain sometimes came on with exertion and other times not. He has a long history of cervical disk problems. With his severe chest pain, he was scheduled for a stress echo later this week but with the pain came to the ED where EKG and troponins are negative. He notes a left-sided headache, his usual neck pain and pain radiating from the mid back around to the chest. No fever. No chills. No abdominal pain. He does note nausea. White count elevated from recent Kenalog shot. He will be admitted for further evaluation and treatment.   PAST MEDICAL HISTORY:  Cervical disk, chronic pain, kidney stones.   PAST SURGICAL HISTORY: Right groin hernia, cervical disk x 3, tonsillectomy.   SOCIAL HISTORY: He smokes 1 pack a day. Married, 4 kids, goes to [employer].   FAMILY HISTORY: Father died of colon cancer.   MEDICATIONS:  Norco 7.5/325 one b.i.d. p.Wyatt.n., folate 1 mg daily.   PHYSICAL EXAMINATION: VITAL SIGNS: Blood pressure 145/75, pulse 80, regular.  HEENT: Pupils are equal and reactive. Oropharynx benign.  NECK: Full range of motion, significant resistance to rotation. Neck and back diffusely tender.  LUNGS: Clear to auscultation and percussion.  HEART: Regular rate and rhythm.  Chest wall: Nontender.  ABDOMEN: Soft, nontender. Good bowel sounds.  SKIN: No rash.  EXTREMITIES: No edema.  NEUROLOGIC: Grossly nonfocal.   ASSESSMENT AND PLAN: 1.  Severe chest pain - very possible coronary disease. We will run more enzymes.  Plan stress Myoview, more likely a musculoskeletal etiology. We will use tizanidine 4 times a day, IV Dilaudid as needed, Xanax 0.5 mg twice a day as muscle relaxant. Follow symptoms closely. 2.  Headache -  with severity. Plan head CT. No evidence for abdominal issue or surgical issue. No significant dyspnea on exertion. Does feel better on oxygen, however. Echocardiogram approximately 2 years ago, normal.    ____________________________ Brett PentonMark F. Miller, Wyatt mfm:ce D: 04/08/2014 17:44:31 ET T: 04/08/2014 18:51:30 ET JOB#: [id number]  cc: Brett PentonMark F. Miller, Wyatt, <Dictator> [name] ELECTRONICALLY SIGNED 04/09/2014 8:06

## 2015-06-11 ENCOUNTER — Emergency Department
Admission: EM | Admit: 2015-06-11 | Discharge: 2015-06-11 | Disposition: A | Discharge status: Home / Self Care | Payer: Medicare Other | Attending: Emergency Medicine | Admitting: Emergency Medicine

## 2015-06-11 ENCOUNTER — Emergency Department: Payer: Medicare Other

## 2015-06-11 DIAGNOSIS — S0003XA Contusion of scalp, initial encounter: Secondary | ICD-10-CM

## 2015-06-11 DIAGNOSIS — S1093XA Contusion of unspecified part of neck, initial encounter: Secondary | ICD-10-CM | POA: Insufficient documentation

## 2015-06-11 DIAGNOSIS — Y9389 Activity, other specified: Secondary | ICD-10-CM | POA: Diagnosis not present

## 2015-06-11 DIAGNOSIS — Z792 Long term (current) use of antibiotics: Secondary | ICD-10-CM | POA: Insufficient documentation

## 2015-06-11 DIAGNOSIS — Y998 Other external cause status: Secondary | ICD-10-CM | POA: Insufficient documentation

## 2015-06-11 DIAGNOSIS — G8929 Other chronic pain: Secondary | ICD-10-CM | POA: Insufficient documentation

## 2015-06-11 DIAGNOSIS — S199XXA Unspecified injury of neck, initial encounter: Secondary | ICD-10-CM | POA: Diagnosis present

## 2015-06-11 DIAGNOSIS — Z720 Tobacco use: Secondary | ICD-10-CM | POA: Insufficient documentation

## 2015-06-11 DIAGNOSIS — W208XXA Other cause of strike by thrown, projected or falling object, initial encounter: Secondary | ICD-10-CM | POA: Diagnosis not present

## 2015-06-11 DIAGNOSIS — Y9289 Other specified places as the place of occurrence of the external cause: Secondary | ICD-10-CM | POA: Diagnosis not present

## 2015-06-11 DIAGNOSIS — Z79899 Other long term (current) drug therapy: Secondary | ICD-10-CM | POA: Diagnosis not present

## 2015-06-11 MED ORDER — PROMETHAZINE HCL 25 MG/ML IJ SOLN
25.0000 mg | Freq: Four times a day (QID) | INTRAMUSCULAR | Status: DC | PRN
  Administered 2015-06-11: 25 mg via INTRAVENOUS
  Filled 2015-06-11: qty 1

## 2015-06-11 MED ORDER — SODIUM CHLORIDE 0.9 % IV BOLUS (SEPSIS)
500.0000 mL | Freq: Once | INTRAVENOUS | Status: AC
  Administered 2015-06-11: 500 mL via INTRAVENOUS

## 2015-06-11 MED ORDER — OXYCODONE HCL 5 MG PO TABS
10.0000 mg | ORAL_TABLET | Freq: Once | ORAL | Status: AC
  Administered 2015-06-11: 10 mg via ORAL
  Filled 2015-06-11: qty 2

## 2015-06-11 MED ORDER — ONDANSETRON 4 MG PO TBDP
4.0000 mg | ORAL_TABLET | Freq: Once | ORAL | Status: AC
  Administered 2015-06-11: 4 mg via ORAL
  Filled 2015-06-11: qty 1

## 2015-06-11 NOTE — Discharge Instructions (Signed)
Contusion A contusion is a deep bruise. Contusions happen when an injury causes bleeding under the skin. Signs of bruising include pain, puffiness (swelling), and discolored skin. The contusion Shimel turn blue, purple, or yellow. HOME CARE   Put ice on the injured area.  Put ice in a plastic bag.  Place a towel between your skin and the bag.  Leave the ice on for 15-20 minutes, 03-04 times a day.  Only take medicine as told by your doctor.  Rest the injured area.  If possible, raise (elevate) the injured area to lessen puffiness. GET HELP RIGHT AWAY IF:   You have more bruising or puffiness.  You have pain that is getting worse.  Your puffiness or pain is not helped by medicine. MAKE SURE YOU:   Understand these instructions.  Will watch your condition.  Will get help right away if you are not doing well or get worse. Document Released: 04/04/2008 Document Revised: 01/09/2012 Document Reviewed: 08/22/2011 [facility] Patient Information 2015 [place], [place]. This information is not intended to replace advice given to you by your health care provider. Make sure you discuss any questions you have with your health care provider.    FOLLOW UP WITH YOUR DOCTOR IN [place] IF ANY CONTINUED PROBLEMS  TAKE YOUR MEDICATIONS AT HOME AS DIRECTED ICE PACK TO YOUR NECK
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
## 2015-06-11 NOTE — ED Notes (Signed)
States he had a large nail gun fall on to post.neck having severe pain with some blurred vision  Hx of having plates ad screws in neck.

## 2015-06-11 NOTE — ED Notes (Signed)
Patient to ED with c/o neck pain after stapler falling off of shelf and hitting him in neck this morning. Patient reports history of neck surgeries in early 90s.

## 2015-07-23 ENCOUNTER — Other Ambulatory Visit: Payer: Self-pay | Admitting: Physical Medicine and Rehabilitation

## 2015-07-23 DIAGNOSIS — M25552 Pain in left hip: Secondary | ICD-10-CM

## 2015-08-01 ENCOUNTER — Ambulatory Visit
Admission: RE | Admit: 2015-08-01 | Discharge: 2015-08-01 | Disposition: A | Discharge status: Home / Self Care | Payer: Medicare Other | Source: Ambulatory Visit | Attending: Physical Medicine and Rehabilitation | Admitting: Physical Medicine and Rehabilitation

## 2015-08-01 DIAGNOSIS — N400 Enlarged prostate without lower urinary tract symptoms: Secondary | ICD-10-CM | POA: Diagnosis not present

## 2015-08-01 DIAGNOSIS — M25552 Pain in left hip: Secondary | ICD-10-CM | POA: Insufficient documentation

## 2016-02-28 ENCOUNTER — Observation Stay (HOSPITAL_BASED_OUTPATIENT_CLINIC_OR_DEPARTMENT_OTHER)
Admit: 2016-02-28 | Discharge: 2016-02-28 | Disposition: A | Discharge status: Home / Self Care | Payer: Medicare Other | Attending: Internal Medicine | Admitting: Internal Medicine

## 2016-02-28 ENCOUNTER — Emergency Department: Payer: Medicare Other

## 2016-02-28 ENCOUNTER — Observation Stay
Admission: EM | Admit: 2016-02-28 | Discharge: 2016-02-29 | Disposition: A | Discharge status: Home / Self Care | Payer: Medicare Other | Attending: Internal Medicine | Admitting: Internal Medicine

## 2016-02-28 ENCOUNTER — Encounter: Payer: Self-pay | Admitting: Emergency Medicine

## 2016-02-28 DIAGNOSIS — R42 Dizziness and giddiness: Secondary | ICD-10-CM | POA: Insufficient documentation

## 2016-02-28 DIAGNOSIS — Z7982 Long term (current) use of aspirin: Secondary | ICD-10-CM | POA: Insufficient documentation

## 2016-02-28 DIAGNOSIS — Z833 Family history of diabetes mellitus: Secondary | ICD-10-CM | POA: Insufficient documentation

## 2016-02-28 DIAGNOSIS — Z809 Family history of malignant neoplasm, unspecified: Secondary | ICD-10-CM | POA: Insufficient documentation

## 2016-02-28 DIAGNOSIS — I071 Rheumatic tricuspid insufficiency: Secondary | ICD-10-CM | POA: Diagnosis not present

## 2016-02-28 DIAGNOSIS — M549 Dorsalgia, unspecified: Secondary | ICD-10-CM | POA: Insufficient documentation

## 2016-02-28 DIAGNOSIS — Z87442 Personal history of urinary calculi: Secondary | ICD-10-CM | POA: Insufficient documentation

## 2016-02-28 DIAGNOSIS — M542 Cervicalgia: Secondary | ICD-10-CM | POA: Insufficient documentation

## 2016-02-28 DIAGNOSIS — F17210 Nicotine dependence, cigarettes, uncomplicated: Secondary | ICD-10-CM | POA: Diagnosis not present

## 2016-02-28 DIAGNOSIS — Z888 Allergy status to other drugs, medicaments and biological substances status: Secondary | ICD-10-CM | POA: Diagnosis not present

## 2016-02-28 DIAGNOSIS — K219 Gastro-esophageal reflux disease without esophagitis: Secondary | ICD-10-CM | POA: Insufficient documentation

## 2016-02-28 DIAGNOSIS — R112 Nausea with vomiting, unspecified: Secondary | ICD-10-CM | POA: Insufficient documentation

## 2016-02-28 DIAGNOSIS — N200 Calculus of kidney: Secondary | ICD-10-CM | POA: Insufficient documentation

## 2016-02-28 DIAGNOSIS — J329 Chronic sinusitis, unspecified: Secondary | ICD-10-CM | POA: Insufficient documentation

## 2016-02-28 DIAGNOSIS — I10 Essential (primary) hypertension: Secondary | ICD-10-CM | POA: Insufficient documentation

## 2016-02-28 DIAGNOSIS — R0602 Shortness of breath: Secondary | ICD-10-CM | POA: Diagnosis not present

## 2016-02-28 DIAGNOSIS — G8929 Other chronic pain: Secondary | ICD-10-CM | POA: Diagnosis not present

## 2016-02-28 DIAGNOSIS — R0789 Other chest pain: Secondary | ICD-10-CM | POA: Diagnosis not present

## 2016-02-28 DIAGNOSIS — R079 Chest pain, unspecified: Secondary | ICD-10-CM | POA: Diagnosis present

## 2016-02-28 DIAGNOSIS — Z885 Allergy status to narcotic agent status: Secondary | ICD-10-CM | POA: Insufficient documentation

## 2016-02-28 LAB — BASIC METABOLIC PANEL
Anion gap: 9 (ref 5–15)
BUN: 14 mg/dL (ref 6–20)
CHLORIDE: 105 mmol/L (ref 101–111)
CO2: 23 mmol/L (ref 22–32)
Calcium: 9.1 mg/dL (ref 8.9–10.3)
Creatinine, Ser: 0.98 mg/dL (ref 0.61–1.24)
GFR calc non Af Amer: >60 mL/min (ref >60)
Glucose, Bld: 103 mg/dL — ABNORMAL HIGH (ref 65–99)
POTASSIUM: 4.1 mmol/L (ref 3.5–5.1)
Sodium: 137 mmol/L (ref 135–145)

## 2016-02-28 LAB — CBC
HCT: 47.5 % (ref 40.0–52.0)
Hemoglobin: 16.3 g/dL (ref 13.0–18.0)
MCH: 33.8 pg (ref 26.0–34.0)
MCHC: 34.3 g/dL (ref 32.0–36.0)
MCV: 98.6 fL (ref 80.0–100.0)
Platelets: 211 10*3/uL (ref 150–440)
RBC: 4.82 MIL/uL (ref 4.40–5.90)
RDW: 13.2 % (ref 11.5–14.5)
WBC: 11.4 10*3/uL — ABNORMAL HIGH (ref 3.8–10.6)

## 2016-02-28 LAB — ECHOCARDIOGRAM COMPLETE
HEIGHTINCHES: 72 in
WEIGHTICAEL: 3051.2 [oz_av]

## 2016-02-28 LAB — APTT: aPTT: 39 seconds — ABNORMAL HIGH (ref 24–36)

## 2016-02-28 LAB — PROTIME-INR
INR: 0.98
PROTHROMBIN TIME: 13.2 s (ref 11.4–15.0)

## 2016-02-28 LAB — TROPONIN I
Troponin I: <0.03 ng/mL (ref <0.031)
Troponin I: <0.03 ng/mL (ref <0.031)

## 2016-02-28 LAB — HEPARIN LEVEL (UNFRACTIONATED): HEPARIN UNFRACTIONATED: 0.39 [IU]/mL (ref 0.30–0.70)

## 2016-02-28 MED ORDER — ROSUVASTATIN CALCIUM 10 MG PO TABS
10.0000 mg | ORAL_TABLET | Freq: Every day | ORAL | Status: DC
  Administered 2016-02-28 – 2016-02-29 (×2): 10 mg via ORAL
  Filled 2016-02-28 (×3): qty 1

## 2016-02-28 MED ORDER — NICOTINE 14 MG/24HR TD PT24
14.0000 mg | MEDICATED_PATCH | Freq: Every day | TRANSDERMAL | Status: DC
  Filled 2016-02-28: qty 1

## 2016-02-28 MED ORDER — PROMETHAZINE HCL 25 MG/ML IJ SOLN
25.0000 mg | Freq: Four times a day (QID) | INTRAMUSCULAR | Status: DC | PRN
  Administered 2016-02-28: 25 mg via INTRAVENOUS
  Filled 2016-02-28 (×2): qty 1

## 2016-02-28 MED ORDER — HEPARIN (PORCINE) IN NACL 100-0.45 UNIT/ML-% IJ SOLN
1100.0000 [IU]/h | INTRAMUSCULAR | Status: DC
  Administered 2016-02-28: 1100 [IU]/h via INTRAVENOUS
  Filled 2016-02-28 (×3): qty 250

## 2016-02-28 MED ORDER — PROCHLORPERAZINE EDISYLATE 5 MG/ML IJ SOLN
10.0000 mg | Freq: Four times a day (QID) | INTRAMUSCULAR | Status: DC | PRN
  Filled 2016-02-28: qty 2

## 2016-02-28 MED ORDER — NITROGLYCERIN 0.4 MG SL SUBL
SUBLINGUAL_TABLET | SUBLINGUAL | Status: AC
  Filled 2016-02-28: qty 1

## 2016-02-28 MED ORDER — NITROGLYCERIN 2 % TD OINT
1.0000 [in_us] | TOPICAL_OINTMENT | Freq: Once | TRANSDERMAL | Status: AC
  Administered 2016-02-28: 1 [in_us] via TOPICAL

## 2016-02-28 MED ORDER — SODIUM CHLORIDE 0.9 % IV SOLN
INTRAVENOUS | Status: DC
  Administered 2016-02-28 – 2016-02-29 (×2): via INTRAVENOUS

## 2016-02-28 MED ORDER — HEPARIN SODIUM (PORCINE) 5000 UNIT/ML IJ SOLN: 4000.0000 [IU] | Freq: Once | INTRAMUSCULAR | Status: DC

## 2016-02-28 MED ORDER — ACETAMINOPHEN 650 MG RE SUPP: 650.0000 mg | Freq: Four times a day (QID) | RECTAL | Status: DC | PRN

## 2016-02-28 MED ORDER — BACLOFEN 10 MG PO TABS
10.0000 mg | ORAL_TABLET | Freq: Three times a day (TID) | ORAL | Status: DC
  Administered 2016-02-28 – 2016-02-29 (×3): 10 mg via ORAL
  Filled 2016-02-28 (×3): qty 1

## 2016-02-28 MED ORDER — CLOPIDOGREL BISULFATE 75 MG PO TABS
300.0000 mg | ORAL_TABLET | Freq: Once | ORAL | Status: AC
  Administered 2016-02-28: 300 mg via ORAL

## 2016-02-28 MED ORDER — ONDANSETRON HCL 4 MG/2ML IJ SOLN
INTRAMUSCULAR | Status: AC
  Filled 2016-02-28: qty 2

## 2016-02-28 MED ORDER — PROCHLORPERAZINE EDISYLATE 5 MG/ML IJ SOLN
5.0000 mg | Freq: Once | INTRAMUSCULAR | Status: AC
  Administered 2016-02-28: 5 mg via INTRAVENOUS
  Filled 2016-02-28: qty 2

## 2016-02-28 MED ORDER — NITROGLYCERIN 0.4 MG SL SUBL
0.4000 mg | SUBLINGUAL_TABLET | SUBLINGUAL | Status: DC | PRN
  Administered 2016-02-28: 0.4 mg via SUBLINGUAL

## 2016-02-28 MED ORDER — BISACODYL 5 MG PO TBEC
5.0000 mg | DELAYED_RELEASE_TABLET | Freq: Every day | ORAL | Status: DC | PRN
  Filled 2016-02-28: qty 1

## 2016-02-28 MED ORDER — MORPHINE SULFATE (PF) 2 MG/ML IV SOLN
INTRAVENOUS | Status: AC
  Filled 2016-02-28: qty 1

## 2016-02-28 MED ORDER — ONDANSETRON HCL 4 MG/2ML IJ SOLN
4.0000 mg | Freq: Once | INTRAMUSCULAR | Status: AC
  Administered 2016-02-28: 4 mg via INTRAVENOUS

## 2016-02-28 MED ORDER — DOCUSATE SODIUM 100 MG PO CAPS
100.0000 mg | ORAL_CAPSULE | Freq: Two times a day (BID) | ORAL | Status: DC
  Administered 2016-02-29: 100 mg via ORAL
  Filled 2016-02-28 (×2): qty 1

## 2016-02-28 MED ORDER — HEPARIN (PORCINE) IN NACL 100-0.45 UNIT/ML-% IJ SOLN: 10.0000 [IU]/kg/h | Freq: Once | INTRAMUSCULAR | Status: DC

## 2016-02-28 MED ORDER — METOPROLOL TARTRATE 25 MG PO TABS: 25.0000 mg | ORAL_TABLET | Freq: Two times a day (BID) | ORAL | Status: DC

## 2016-02-28 MED ORDER — ACETAMINOPHEN 325 MG PO TABS
650.0000 mg | ORAL_TABLET | Freq: Four times a day (QID) | ORAL | Status: DC | PRN
  Filled 2016-02-28: qty 2

## 2016-02-28 MED ORDER — MORPHINE SULFATE (PF) 2 MG/ML IV SOLN
2.0000 mg | Freq: Once | INTRAVENOUS | Status: AC
  Administered 2016-02-28: 2 mg via INTRAVENOUS

## 2016-02-28 MED ORDER — HYDROCODONE-ACETAMINOPHEN 5-325 MG PO TABS: 1.0000 | ORAL_TABLET | ORAL | Status: DC | PRN

## 2016-02-28 MED ORDER — HEPARIN BOLUS VIA INFUSION
4000.0000 [IU] | Freq: Once | INTRAVENOUS | Status: AC
  Administered 2016-02-28: 4000 [IU] via INTRAVENOUS
  Filled 2016-02-28: qty 4000

## 2016-02-28 MED ORDER — GABAPENTIN 400 MG PO CAPS
400.0000 mg | ORAL_CAPSULE | Freq: Two times a day (BID) | ORAL | Status: DC
  Administered 2016-02-28 – 2016-02-29 (×2): 400 mg via ORAL
  Filled 2016-02-28 (×2): qty 1

## 2016-02-28 MED ORDER — ASPIRIN 81 MG PO CHEW
324.0000 mg | CHEWABLE_TABLET | Freq: Once | ORAL | Status: AC
  Administered 2016-02-28: 324 mg via ORAL

## 2016-02-28 MED ORDER — MORPHINE SULFATE (PF) 2 MG/ML IV SOLN: 2.0000 mg | INTRAVENOUS | Status: DC | PRN

## 2016-02-28 MED ORDER — ASPIRIN 81 MG PO CHEW
81.0000 mg | CHEWABLE_TABLET | Freq: Every day | ORAL | Status: DC
  Administered 2016-02-29: 81 mg via ORAL
  Filled 2016-02-28: qty 1

## 2016-02-28 MED ORDER — NITROGLYCERIN 2 % TD OINT
0.5000 [in_us] | TOPICAL_OINTMENT | Freq: Four times a day (QID) | TRANSDERMAL | Status: DC
  Administered 2016-02-28 – 2016-02-29 (×2): 0.5 [in_us] via TOPICAL

## 2016-02-28 MED ORDER — ASPIRIN 81 MG PO CHEW
CHEWABLE_TABLET | ORAL | Status: AC
  Filled 2016-02-28: qty 4
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
## 2016-02-28 NOTE — Care Management Obs Status (Signed)
MEDICARE OBSERVATION STATUS NOTIFICATION   Patient Details  Name: [name] MRN: [id number] Date of Birth: [birth year]   Medicare Observation Status Notification Given:       Yvone NeuCrutchfield, Taniya Dasher M, RN 02/28/2016, 7:00 PM

## 2016-02-28 NOTE — Progress Notes (Signed)
Patient alert and oriented x4. Oriented to room, unit, and call bell. Admission completed. C/o nausea, Dr. Luberta MutterKonidena paged. Will cont to assess. Skin assessment verified by Jennette Dubinoll F, RN. Telemetry box verified by Edson SnowballShana, NT. Trudee KusterBrandi R Mansfield

## 2016-02-28 NOTE — ED Notes (Signed)
Attempted to call report to 2A, ascom # given for nurse to call back

## 2016-02-28 NOTE — ED Notes (Signed)
Per MD orders, holding heparin until further orders given

## 2016-02-28 NOTE — ED Notes (Signed)
Pt presents with left side chest pain that radiates to left arm. Pt reports SOB, dizziness and lightheadedness.
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
## 2016-02-29 ENCOUNTER — Observation Stay: Payer: Medicare Other

## 2016-02-29 LAB — BASIC METABOLIC PANEL
Anion gap: 7 (ref 5–15)
BUN: 13 mg/dL (ref 6–20)
CHLORIDE: 109 mmol/L (ref 101–111)
CO2: 23 mmol/L (ref 22–32)
CREATININE: 1.09 mg/dL (ref 0.61–1.24)
Calcium: 8.4 mg/dL — ABNORMAL LOW (ref 8.9–10.3)
GFR calc Af Amer: >60 mL/min (ref >60)
GFR calc non Af Amer: >60 mL/min (ref >60)
GLUCOSE: 92 mg/dL (ref 65–99)
Potassium: 4.1 mmol/L (ref 3.5–5.1)
SODIUM: 139 mmol/L (ref 135–145)

## 2016-02-29 LAB — CBC
HEMATOCRIT: 42.5 % (ref 40.0–52.0)
Hemoglobin: 14.5 g/dL (ref 13.0–18.0)
MCH: 34.1 pg — AB (ref 26.0–34.0)
MCHC: 34.2 g/dL (ref 32.0–36.0)
MCV: 99.8 fL (ref 80.0–100.0)
PLATELETS: 174 10*3/uL (ref 150–440)
RBC: 4.26 MIL/uL — ABNORMAL LOW (ref 4.40–5.90)
RDW: 13.3 % (ref 11.5–14.5)
WBC: 7.9 10*3/uL (ref 3.8–10.6)

## 2016-02-29 LAB — NM MYOCAR MULTI W/SPECT W/WALL MOTION / EF
CHL CUP NUCLEAR SDS: 0
CHL CUP NUCLEAR SRS: 0
CHL CUP NUCLEAR SSS: 0
Estimated workload: 1 METS
Exercise duration (min): 1 min
Exercise duration (sec): 50 s
LV sys vol: 28 mL
LVDIAVOL: 77 mL (ref 62–150)
Peak HR: 91 {beats}/min
Rest HR: 51 {beats}/min
TID: 1.02

## 2016-02-29 LAB — GLUCOSE, CAPILLARY: Glucose-Capillary: 116 mg/dL — ABNORMAL HIGH (ref 65–99)

## 2016-02-29 LAB — HEPARIN LEVEL (UNFRACTIONATED): Heparin Unfractionated: 0.3 IU/mL (ref 0.30–0.70)

## 2016-02-29 MED ORDER — OXYCODONE HCL 5 MG PO TABS
5.0000 mg | ORAL_TABLET | Freq: Three times a day (TID) | ORAL | Status: DC | PRN
  Administered 2016-02-29: 5 mg via ORAL
  Filled 2016-02-29: qty 1

## 2016-02-29 MED ORDER — ASPIRIN 81 MG PO CHEW
81.0000 mg | CHEWABLE_TABLET | Freq: Every day | ORAL | Status: DC
Start: 2016-02-29 — End: 2018-05-31

## 2016-02-29 MED ORDER — TECHNETIUM TC 99M SESTAMIBI - CARDIOLITE
28.7800 | Freq: Once | INTRAVENOUS | Status: AC | PRN
  Administered 2016-02-29: 28.78 via INTRAVENOUS

## 2016-02-29 MED ORDER — TECHNETIUM TC 99M SESTAMIBI - CARDIOLITE
13.8090 | Freq: Once | INTRAVENOUS | Status: AC | PRN
  Administered 2016-02-29: 11:00:00 13.809 via INTRAVENOUS

## 2016-02-29 MED ORDER — GABAPENTIN 400 MG PO CAPS
400.0000 mg | ORAL_CAPSULE | Freq: Three times a day (TID) | ORAL | Status: DC
  Administered 2016-02-29: 400 mg via ORAL
  Filled 2016-02-29: qty 1

## 2016-02-29 MED ORDER — OXYCODONE-ACETAMINOPHEN 5-325 MG PO TABS
1.0000 | ORAL_TABLET | Freq: Three times a day (TID) | ORAL | Status: DC | PRN
  Administered 2016-02-29: 1 via ORAL
  Filled 2016-02-29: qty 1

## 2016-02-29 MED ORDER — NICOTINE 14 MG/24HR TD PT24: 14.0000 mg | MEDICATED_PATCH | Freq: Every day | TRANSDERMAL | Status: DC

## 2016-02-29 MED ORDER — REGADENOSON 0.4 MG/5ML IV SOLN
0.4000 mg | Freq: Once | INTRAVENOUS | Status: AC
  Administered 2016-02-29: 0.4 mg via INTRAVENOUS

## 2016-02-29 NOTE — Discharge Instructions (Signed)

## 2016-02-29 NOTE — Progress Notes (Signed)
Patient d/c'd home. Education provided, no questions at this time. Patient picked up by wife. Telemetry removed. Apoorva Bugay R Mansfield   

## 2016-02-29 NOTE — Progress Notes (Signed)
Patient takes percocet 10-325 3 times daily at home and gabapentin 3 times daily also. Per Dr. Renae GlossWieting okay to place these home medication orders. Verified with patient's pharmacy. Trudee KusterBrandi R Mansfield

## 2016-02-29 NOTE — Care Management Obs Status (Signed)
MEDICARE OBSERVATION STATUS NOTIFICATION   Patient Details  Name: [name] MRN: [id number] Date of Birth: [birth year]   Medicare Observation Status Notification Given:  Yes    Marily MemosLisa M Ziyanna Tolin, RN 02/29/2016, 1:33 PM
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
## 2016-03-08 ENCOUNTER — Ambulatory Visit: Payer: Medicare Other | Admitting: Cardiovascular Disease

## 2016-03-08 NOTE — Progress Notes (Signed)
No SHow

## 2016-03-10 ENCOUNTER — Encounter: Payer: Medicare Other | Admitting: Cardiovascular Disease

## 2016-05-30 ENCOUNTER — Other Ambulatory Visit: Payer: Self-pay | Admitting: Orthopedic Surgery

## 2016-05-30 DIAGNOSIS — R0781 Pleurodynia: Secondary | ICD-10-CM

## 2016-05-30 DIAGNOSIS — R079 Chest pain, unspecified: Secondary | ICD-10-CM

## 2016-06-01 ENCOUNTER — Ambulatory Visit
Admission: RE | Admit: 2016-06-01 | Discharge: 2016-06-01 | Disposition: A | Discharge status: Home / Self Care | Payer: Medicare Other | Source: Ambulatory Visit | Attending: Orthopedic Surgery | Admitting: Orthopedic Surgery

## 2016-06-01 DIAGNOSIS — R0781 Pleurodynia: Secondary | ICD-10-CM

## 2016-06-01 DIAGNOSIS — R079 Chest pain, unspecified: Secondary | ICD-10-CM

## 2016-06-15 ENCOUNTER — Other Ambulatory Visit: Payer: Self-pay | Admitting: Internal Medicine

## 2016-06-15 DIAGNOSIS — R918 Other nonspecific abnormal finding of lung field: Secondary | ICD-10-CM

## 2016-12-01 ENCOUNTER — Ambulatory Visit
Admission: RE | Admit: 2016-12-01 | Discharge: 2016-12-01 | Disposition: A | Discharge status: Home / Self Care | Payer: Medicare Other | Source: Ambulatory Visit | Attending: Internal Medicine | Admitting: Internal Medicine

## 2016-12-01 DIAGNOSIS — I2510 Atherosclerotic heart disease of native coronary artery without angina pectoris: Secondary | ICD-10-CM | POA: Insufficient documentation

## 2016-12-01 DIAGNOSIS — R911 Solitary pulmonary nodule: Secondary | ICD-10-CM | POA: Insufficient documentation

## 2016-12-01 DIAGNOSIS — I700 Atherosclerosis of aorta: Secondary | ICD-10-CM | POA: Insufficient documentation

## 2016-12-01 DIAGNOSIS — R918 Other nonspecific abnormal finding of lung field: Secondary | ICD-10-CM

## 2016-12-01 DIAGNOSIS — J439 Emphysema, unspecified: Secondary | ICD-10-CM | POA: Diagnosis not present

## 2016-12-01 MED ORDER — IOPAMIDOL (ISOVUE-300) INJECTION 61%
75.0000 mL | Freq: Once | INTRAVENOUS | Status: AC | PRN
  Administered 2016-12-01: 75 mL via INTRAVENOUS

## 2016-12-06 ENCOUNTER — Other Ambulatory Visit: Payer: Self-pay | Admitting: Internal Medicine

## 2016-12-06 DIAGNOSIS — R918 Other nonspecific abnormal finding of lung field: Secondary | ICD-10-CM

## 2017-08-15 ENCOUNTER — Other Ambulatory Visit: Payer: Self-pay | Admitting: Surgery

## 2017-08-15 DIAGNOSIS — N433 Hydrocele, unspecified: Secondary | ICD-10-CM

## 2017-08-21 ENCOUNTER — Ambulatory Visit
Admission: RE | Admit: 2017-08-21 | Discharge: 2017-08-21 | Disposition: A | Discharge status: Home / Self Care | Payer: Medicare Other | Source: Ambulatory Visit | Attending: Surgery | Admitting: Surgery

## 2017-08-21 DIAGNOSIS — N433 Hydrocele, unspecified: Secondary | ICD-10-CM

## 2018-05-06 ENCOUNTER — Emergency Department (HOSPITAL_COMMUNITY): Payer: Medicare Other

## 2018-05-06 ENCOUNTER — Encounter (HOSPITAL_COMMUNITY): Payer: Self-pay | Admitting: Emergency Medicine

## 2018-05-06 ENCOUNTER — Emergency Department (HOSPITAL_COMMUNITY)
Admission: EM | Admit: 2018-05-06 | Discharge: 2018-05-07 | Disposition: A | Discharge status: Home / Self Care | Payer: Medicare Other | Attending: Emergency Medicine | Admitting: Emergency Medicine

## 2018-05-06 DIAGNOSIS — R1030 Lower abdominal pain, unspecified: Secondary | ICD-10-CM | POA: Diagnosis present

## 2018-05-06 DIAGNOSIS — Z79899 Other long term (current) drug therapy: Secondary | ICD-10-CM | POA: Insufficient documentation

## 2018-05-06 DIAGNOSIS — F17210 Nicotine dependence, cigarettes, uncomplicated: Secondary | ICD-10-CM | POA: Diagnosis not present

## 2018-05-06 DIAGNOSIS — R1031 Right lower quadrant pain: Secondary | ICD-10-CM | POA: Insufficient documentation

## 2018-05-06 HISTORY — DX: Diverticulitis of intestine, part unspecified, without perforation or abscess without bleeding: K57.92

## 2018-05-06 LAB — COMPREHENSIVE METABOLIC PANEL
ALK PHOS: 57 U/L (ref 38–126)
ALT: 14 U/L (ref 0–44)
ANION GAP: 7 (ref 5–15)
AST: 15 U/L (ref 15–41)
Albumin: 3.8 g/dL (ref 3.5–5.0)
BILIRUBIN TOTAL: 0.7 mg/dL (ref 0.3–1.2)
BUN: 11 mg/dL (ref 6–20)
CALCIUM: 9 mg/dL (ref 8.9–10.3)
CO2: 24 mmol/L (ref 22–32)
Chloride: 106 mmol/L (ref 98–111)
Creatinine, Ser: 0.96 mg/dL (ref 0.61–1.24)
Glucose, Bld: 101 mg/dL — ABNORMAL HIGH (ref 70–99)
POTASSIUM: 4 mmol/L (ref 3.5–5.1)
Sodium: 137 mmol/L (ref 135–145)
TOTAL PROTEIN: 6.7 g/dL (ref 6.5–8.1)

## 2018-05-06 LAB — TYPE AND SCREEN
ABO/RH(D): A POS
ANTIBODY SCREEN: NEGATIVE

## 2018-05-06 LAB — CBC
HEMATOCRIT: 48.5 % (ref 39.0–52.0)
HEMOGLOBIN: 16.1 g/dL (ref 13.0–17.0)
MCH: 32.7 pg (ref 26.0–34.0)
MCHC: 33.2 g/dL (ref 30.0–36.0)
MCV: 98.6 fL (ref 78.0–100.0)
Platelets: 219 10*3/uL (ref 150–400)
RBC: 4.92 MIL/uL (ref 4.22–5.81)
RDW: 12.9 % (ref 11.5–15.5)
WBC: 7.6 10*3/uL (ref 4.0–10.5)

## 2018-05-06 LAB — LIPASE, BLOOD: LIPASE: 37 U/L (ref 11–51)

## 2018-05-06 LAB — POC OCCULT BLOOD, ED: Fecal Occult Bld: NEGATIVE

## 2018-05-06 MED ORDER — HYDROMORPHONE HCL 1 MG/ML IJ SOLN
1.0000 mg | Freq: Once | INTRAMUSCULAR | Status: AC
  Administered 2018-05-06: 1 mg via INTRAVENOUS
  Filled 2018-05-06: qty 1

## 2018-05-06 MED ORDER — IOHEXOL 300 MG/ML  SOLN
100.0000 mL | Freq: Once | INTRAMUSCULAR | Status: AC | PRN
  Administered 2018-05-06: 100 mL via INTRAVENOUS

## 2018-05-06 MED ORDER — ONDANSETRON HCL 4 MG/2ML IJ SOLN
4.0000 mg | Freq: Once | INTRAMUSCULAR | Status: AC
  Administered 2018-05-06: 4 mg via INTRAVENOUS
  Filled 2018-05-06: qty 2

## 2018-05-06 MED ORDER — LACTATED RINGERS IV BOLUS
1000.0000 mL | Freq: Once | INTRAVENOUS | Status: AC
  Administered 2018-05-06: 1000 mL via INTRAVENOUS

## 2018-05-06 MED ORDER — METOCLOPRAMIDE HCL 5 MG/ML IJ SOLN
5.0000 mg | Freq: Once | INTRAMUSCULAR | Status: AC
  Administered 2018-05-06: 5 mg via INTRAVENOUS
  Filled 2018-05-06: qty 2

## 2018-05-06 MED ORDER — LORAZEPAM 2 MG/ML IJ SOLN
0.5000 mg | Freq: Once | INTRAMUSCULAR | Status: DC
  Filled 2018-05-06: qty 1

## 2018-05-06 NOTE — ED Triage Notes (Signed)
Pt reports lower abd pain onset this evening with N/V. Pt states he also has had blood in his stool X2 weeks. Pt has hx of diverticulitis and is scheduled to have colonoscopy at end of the month. Pt also sees Dr. Magnus IvanBlackman for hernia.

## 2018-05-06 NOTE — ED Notes (Signed)
Pt was transported back from CT. RN saw patient in hallway. NAD noted

## 2018-05-06 NOTE — ED Provider Notes (Signed)
I saw and evaluated the patient, reviewed the resident's note and I agree with the findings and plan.  EKG: None 57 year old male presents with left lower quadrant abdominal pain with associated blood in stool x2 weeks.  On exam he is tender in his left lower quadrant.  Patient medicated for pain here and abdominal CT is pending.   Lorre NickAllen, Chanze Teagle, MD 05/06/18 2219
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
## 2018-05-07 LAB — URINALYSIS, ROUTINE W REFLEX MICROSCOPIC
BILIRUBIN URINE: NEGATIVE
Bacteria, UA: NONE SEEN
GLUCOSE, UA: NEGATIVE mg/dL
Ketones, ur: NEGATIVE mg/dL
Leukocytes, UA: NEGATIVE
NITRITE: NEGATIVE
PH: 6 (ref 5.0–8.0)
Protein, ur: NEGATIVE mg/dL
SPECIFIC GRAVITY, URINE: 1.034 — AB (ref 1.005–1.030)

## 2018-05-07 LAB — ABO/RH: ABO/RH(D): A POS

## 2018-05-07 MED ORDER — OXYCODONE-ACETAMINOPHEN 5-325 MG PO TABS
1.0000 | ORAL_TABLET | Freq: Once | ORAL | Status: AC
  Administered 2018-05-07: 1 via ORAL
  Filled 2018-05-07: qty 1

## 2018-05-07 NOTE — ED Notes (Signed)
Pt understood dc material. NAD noted. 

## 2018-05-18 ENCOUNTER — Other Ambulatory Visit: Payer: Self-pay | Admitting: Internal Medicine

## 2018-05-18 DIAGNOSIS — R0602 Shortness of breath: Secondary | ICD-10-CM

## 2018-05-28 ENCOUNTER — Ambulatory Visit: Admission: RE | Admit: 2018-05-28 | Payer: Medicare Other | Source: Ambulatory Visit

## 2018-05-31 ENCOUNTER — Ambulatory Visit: Payer: Medicare Other | Admitting: Urology

## 2018-05-31 ENCOUNTER — Other Ambulatory Visit: Payer: Self-pay

## 2018-05-31 ENCOUNTER — Encounter: Payer: Self-pay | Admitting: Urology

## 2018-05-31 VITALS — BP 93/62 | HR 94 | Ht 72.0 in | Wt 170.4 lb

## 2018-05-31 DIAGNOSIS — R3129 Other microscopic hematuria: Secondary | ICD-10-CM

## 2018-05-31 DIAGNOSIS — R1030 Lower abdominal pain, unspecified: Secondary | ICD-10-CM

## 2018-05-31 LAB — MICROSCOPIC EXAMINATION

## 2018-05-31 LAB — URINALYSIS, COMPLETE
BILIRUBIN UA: NEGATIVE
GLUCOSE, UA: NEGATIVE
Leukocytes, UA: NEGATIVE
Nitrite, UA: NEGATIVE
PH UA: 5.5 (ref 5.0–7.5)
UUROB: 0.2 mg/dL (ref 0.2–1.0)
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
## 2018-06-01 ENCOUNTER — Ambulatory Visit
Admission: RE | Admit: 2018-06-01 | Discharge: 2018-06-01 | Disposition: A | Discharge status: Home / Self Care | Payer: Medicare Other | Source: Ambulatory Visit | Attending: Urology | Admitting: Urology

## 2018-06-01 ENCOUNTER — Other Ambulatory Visit: Payer: Self-pay

## 2018-06-01 DIAGNOSIS — N200 Calculus of kidney: Secondary | ICD-10-CM

## 2018-06-01 DIAGNOSIS — R3129 Other microscopic hematuria: Secondary | ICD-10-CM

## 2018-06-04 ENCOUNTER — Telehealth: Payer: Self-pay

## 2018-06-04 NOTE — Telephone Encounter (Signed)
-----   Message from Riki AltesScott C Stoioff, MD sent at 06/03/2018  8:58 AM EDT ----- KUB showed no significant abnormalities.

## 2018-06-04 NOTE — Telephone Encounter (Signed)
Left message for pt to call office

## 2018-06-05 ENCOUNTER — Telehealth: Payer: Self-pay | Admitting: Urology

## 2018-06-05 ENCOUNTER — Ambulatory Visit
Admission: RE | Admit: 2018-06-05 | Discharge: 2018-06-05 | Disposition: A | Discharge status: Home / Self Care | Payer: Medicare Other | Source: Ambulatory Visit | Attending: Internal Medicine | Admitting: Internal Medicine

## 2018-06-05 DIAGNOSIS — R0602 Shortness of breath: Secondary | ICD-10-CM | POA: Insufficient documentation

## 2018-06-05 DIAGNOSIS — R918 Other nonspecific abnormal finding of lung field: Secondary | ICD-10-CM | POA: Insufficient documentation

## 2018-06-05 MED ORDER — IOHEXOL 300 MG/ML  SOLN
75.0000 mL | Freq: Once | INTRAMUSCULAR | Status: AC | PRN
  Administered 2018-06-05: 75 mL via INTRAVENOUS

## 2018-06-05 NOTE — Telephone Encounter (Signed)
Left message for pt

## 2018-06-05 NOTE — Telephone Encounter (Signed)
Left message

## 2018-06-05 NOTE — Telephone Encounter (Signed)
Pt returned your call. Please call pt at [phone]

## 2018-06-11 ENCOUNTER — Telehealth: Payer: Self-pay | Admitting: Urology

## 2018-06-11 NOTE — Telephone Encounter (Signed)
When I saw him there was nothing to suggest a urologic etiology to his pain.  Would recommend he contact his PCP to determine where to go at this point.  He Schmuck need referral to a tertiary center for further evaluation.

## 2018-06-11 NOTE — Telephone Encounter (Signed)
Patient's wife called the office this morning.  Patient has experienced worsening groin pain.  He had to be helped out of church on Sunday.  Patient has had a colonoscopy, hernia surgery with mesh, CT, ultrasound and have been unsuccessful finding any answers.  What would you recommend?  Patient's wife, Helmut Musterlicia, can be reached at [phone].

## 2018-06-13 ENCOUNTER — Other Ambulatory Visit: Payer: Self-pay | Admitting: Surgery

## 2018-06-14 NOTE — Telephone Encounter (Signed)
Notified on vmail ,

## 2018-06-15 ENCOUNTER — Other Ambulatory Visit: Payer: Self-pay | Admitting: Surgery

## 2018-06-15 ENCOUNTER — Ambulatory Visit
Admission: RE | Admit: 2018-06-15 | Discharge: 2018-06-15 | Disposition: A | Discharge status: Home / Self Care | Payer: Medicare Other | Source: Ambulatory Visit | Attending: Surgery | Admitting: Surgery

## 2018-06-15 DIAGNOSIS — R102 Pelvic and perineal pain: Secondary | ICD-10-CM

## 2018-06-18 ENCOUNTER — Ambulatory Visit
Admission: RE | Admit: 2018-06-18 | Discharge: 2018-06-18 | Disposition: A | Discharge status: Home / Self Care | Payer: Medicare Other | Source: Ambulatory Visit | Attending: Surgery | Admitting: Surgery

## 2018-06-18 DIAGNOSIS — R102 Pelvic and perineal pain: Secondary | ICD-10-CM

## 2018-06-18 MED ORDER — IOPAMIDOL (ISOVUE-300) INJECTION 61%
125.0000 mL | Freq: Once | INTRAVENOUS | Status: AC | PRN
  Administered 2018-06-18: 125 mL via INTRAVENOUS

## 2018-06-28 ENCOUNTER — Other Ambulatory Visit: Payer: Self-pay | Admitting: Surgery

## 2018-06-28 DIAGNOSIS — R1031 Right lower quadrant pain: Secondary | ICD-10-CM

## 2018-07-04 ENCOUNTER — Ambulatory Visit
Admission: RE | Admit: 2018-07-04 | Discharge: 2018-07-04 | Disposition: A | Discharge status: Home / Self Care | Payer: Medicare Other | Source: Ambulatory Visit | Attending: Surgery | Admitting: Surgery

## 2018-07-04 DIAGNOSIS — R1031 Right lower quadrant pain: Secondary | ICD-10-CM

## 2018-07-04 MED ORDER — GADOBENATE DIMEGLUMINE 529 MG/ML IV SOLN
15.0000 mL | Freq: Once | INTRAVENOUS | Status: AC | PRN
  Administered 2018-07-04: 15 mL via INTRAVENOUS

## 2018-07-05 ENCOUNTER — Other Ambulatory Visit: Payer: Medicare Other

## 2018-07-10 ENCOUNTER — Other Ambulatory Visit: Payer: Medicare Other | Admitting: Urology

## 2018-08-07 ENCOUNTER — Other Ambulatory Visit: Payer: Self-pay | Admitting: Surgery

## 2018-08-08 ENCOUNTER — Other Ambulatory Visit: Payer: Self-pay | Admitting: Urology

## 2018-09-05 ENCOUNTER — Encounter (HOSPITAL_COMMUNITY): Payer: Self-pay | Admitting: *Deleted

## 2018-09-20 NOTE — Progress Notes (Signed)
CHEST CT 06-05-18 EPIC

## 2018-09-20 NOTE — Patient Instructions (Addendum)
[name]  09/20/2018   Your procedure is scheduled on: 10-03-18  [facility]  Report to admitting at 630M    Call this number if you have problems the morning of surgery [phone]   Remember: Do not eat food or drink liquids :After Midnight. BRUSH YOUR TEETH MORNING OF SURGERY AND RINSE YOUR MOUTH OUT, NO CHEWING GUM CANDY OR MINTS.  NO SOLID FOOD AFTER MIDNIGHT THE NIGHT PRIOR TO SURGERY. NOTHING BY MOUTH EXCEPT CLEAR LIQUIDS UNTIL 3 HOURS PRIOR TO SCHEULED SURGERY. PLEASE FINISH ENSURE DRINK PER SURGEON ORDER 3 HOURS PRIOR TO SCHEDULED SURGERY TIME WHICH NEEDS TO BE COMPLETED AT 530 AM.    CLEAR LIQUID DIET   Foods Allowed                                                                     Foods Excluded  Coffee and tea, regular and decaf                             liquids that you cannot  Plain Jell-O in any flavor                                             see through such as: Fruit ices (not with fruit pulp)                                     milk, soups, orange juice  Iced Popsicles                                    All solid food Carbonated beverages, regular and diet                                    Cranberry, grape and apple juices Sports drinks like Gatorade Lightly seasoned clear broth or consume(fat free) Sugar, honey syrup  Sample Menu Breakfast                                Lunch                                     Supper Cranberry juice                    Beef broth                            Chicken broth Jell-O  Grape juice                           Apple juice Coffee or tea                        Jell-O                                      Popsicle                                                Coffee or tea                        Coffee or tea  _____________________________________________________________________    Take these medicines the morning of surgery  with A SIP OF WATER: OXYCODONE IF NEEDED, GABAPENTIN (NEURONTIN) PHENERGAN IF NEEDED                               You Ricco not have any metal on your body including hair pins and              piercings  Do not wear jewelry, make-up, lotions, powders or perfumes, deodorant             Do not wear nail polish.  Do not shave  48 hours prior to surgery.              Men Baptista shave face and neck.   Do not bring valuables to the hospital. [facility] IS NOT             RESPONSIBLE   FOR VALUABLES.  Contacts, dentures or bridgework Iams not be worn into surgery.  Leave suitcase in the car. After surgery it Doto be brought to your room.     Patients discharged the day of surgery will not be allowed to drive home.  Name and phone number of your driver: Dwyane Dee CELL [phone]  Special Instructions: N/A              Please read over the following fact sheets you were given: _____________________________________________________________________  [facility] - Preparing for Surgery Before surgery, you can play an important role.  Because skin is not sterile, your skin needs to be as free of germs as possible.  You can reduce the number of germs on your skin by washing with CHG (chlorahexidine gluconate) soap before surgery.  CHG is an antiseptic cleaner which kills germs and bonds with the skin to continue killing germs even after washing. Please DO NOT use if you have an allergy to CHG or antibacterial soaps.  If your skin becomes reddened/irritated stop using the CHG and inform your nurse when you arrive at Short Stay. Do not shave (including legs and underarms) for at least 48 hours prior to the first CHG shower.  You Buda shave your face/neck. Please follow these instructions carefully:  1.  Shower with CHG Soap the night before surgery and the  morning of Surgery.  2.  If you choose to wash your hair, wash your hair first as usual with your  normal  shampoo.  3.  After you shampoo, rinse your  hair and body thoroughly to remove the  shampoo.                           4.  Use CHG as you would any other liquid soap.  You can apply chg directly  to the skin and wash                       Gently with a scrungie or clean washcloth.  5.  Apply the CHG Soap to your body ONLY FROM THE NECK DOWN.   Do not use on face/ open                           Wound or open sores. Avoid contact with eyes, ears mouth and genitals (private parts).                       Wash face,  Genitals (private parts) with your normal soap.             6.  Wash thoroughly, paying special attention to the area where your surgery  will be performed.  7.  Thoroughly rinse your body with warm water from the neck down.  8.  DO NOT shower/wash with your normal soap after using and rinsing off  the CHG Soap.                9.  Pat yourself dry with a clean towel.            10.  Wear clean pajamas.            11.  Place clean sheets on your bed the night of your first shower and do not  sleep with pets. Day of Surgery : Do not apply any lotions/deodorants the morning of surgery.  Please wear clean clothes to the hospital/surgery center.  FAILURE TO FOLLOW THESE INSTRUCTIONS Fearn RESULT IN THE CANCELLATION OF YOUR SURGERY PATIENT SIGNATURE_________________________________  NURSE SIGNATURE__________________________________  ________________________________________________________________________

## 2018-09-24 ENCOUNTER — Other Ambulatory Visit: Payer: Self-pay

## 2018-09-24 ENCOUNTER — Encounter (HOSPITAL_COMMUNITY)
Admission: RE | Admit: 2018-09-24 | Discharge: 2018-09-24 | Disposition: A | Discharge status: Home / Self Care | Payer: Medicare Other | Source: Ambulatory Visit | Attending: Urology | Admitting: Urology

## 2018-09-24 ENCOUNTER — Encounter (HOSPITAL_COMMUNITY): Payer: Self-pay

## 2018-09-24 DIAGNOSIS — Z01812 Encounter for preprocedural laboratory examination: Secondary | ICD-10-CM | POA: Diagnosis not present

## 2018-09-24 HISTORY — DX: Personal history of urinary calculi: Z87.442

## 2018-09-24 LAB — BASIC METABOLIC PANEL
ANION GAP: 7 (ref 5–15)
BUN: 14 mg/dL (ref 6–20)
CALCIUM: 8.8 mg/dL — AB (ref 8.9–10.3)
CHLORIDE: 107 mmol/L (ref 98–111)
CO2: 25 mmol/L (ref 22–32)
Creatinine, Ser: 0.88 mg/dL (ref 0.61–1.24)
GFR calc non Af Amer: >60 mL/min (ref >60)
Glucose, Bld: 122 mg/dL — ABNORMAL HIGH (ref 70–99)
Potassium: 3.9 mmol/L (ref 3.5–5.1)
Sodium: 139 mmol/L (ref 135–145)

## 2018-09-24 LAB — CBC
HEMATOCRIT: 46.5 % (ref 39.0–52.0)
HEMOGLOBIN: 15.2 g/dL (ref 13.0–17.0)
MCH: 33 pg (ref 26.0–34.0)
MCHC: 32.7 g/dL (ref 30.0–36.0)
MCV: 100.9 fL — ABNORMAL HIGH (ref 80.0–100.0)
NRBC: 0 % (ref 0.0–0.2)
Platelets: 242 10*3/uL (ref 150–400)
RBC: 4.61 MIL/uL (ref 4.22–5.81)
RDW: 12.3 % (ref 11.5–15.5)
WBC: 8.5 10*3/uL (ref 4.0–10.5)
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
## 2018-10-02 NOTE — Anesthesia Preprocedure Evaluation (Addendum)
Anesthesia Evaluation  Patient identified by MRN, date of birth, ID band Patient awake    Reviewed: Allergy & Precautions, NPO status , Patient's Chart, lab work & pertinent test results  History of Anesthesia Complications (+) PONV and history of anesthetic complications  Airway Mallampati: III   Neck ROM: Limited  Mouth opening: Limited Mouth Opening  Dental  (+) Partial Upper, Dental Advisory Given   Pulmonary neg pulmonary ROS, Current Smoker,    breath sounds clear to auscultation       Cardiovascular negative cardio ROS   Rhythm:Regular Rate:Normal     Neuro/Psych  Headaches, negative psych ROS   GI/Hepatic negative GI ROS, Neg liver ROS,   Endo/Other  negative endocrine ROS  Renal/GU negative Renal ROS  negative genitourinary   Musculoskeletal  (+) Arthritis , Osteoarthritis,    Abdominal   Peds  Hematology negative hematology ROS (+)   Anesthesia Other Findings Right hydrocele  Reproductive/Obstetrics                            Anesthesia Physical Anesthesia Plan  ASA: II  Anesthesia Plan: General   Post-op Pain Management:    Induction: Intravenous  PONV Risk Score and Plan: 2 and Dexamethasone, Ondansetron, Midazolam, Scopolamine patch - Pre-op and TIVA  Airway Management Planned: LMA and Oral ETT  Additional Equipment:   Intra-op Plan:   Post-operative Plan: Extubation in OR  Informed Consent: I have reviewed the patients History and Physical, chart, labs and discussed the procedure including the risks, benefits and alternatives for the proposed anesthesia with the patient or authorized representative who has indicated his/her understanding and acceptance.   Dental advisory given  Plan Discussed with: CRNA  Anesthesia Plan Comments:        Anesthesia Quick Evaluation

## 2018-10-03 ENCOUNTER — Observation Stay (HOSPITAL_COMMUNITY)
Admission: RE | Admit: 2018-10-03 | Discharge: 2018-10-04 | Disposition: A | Discharge status: Home / Self Care | Payer: Medicare Other | Source: Ambulatory Visit | Attending: Surgery | Admitting: Surgery

## 2018-10-03 ENCOUNTER — Ambulatory Visit (HOSPITAL_COMMUNITY): Payer: Medicare Other | Admitting: Anesthesiology

## 2018-10-03 ENCOUNTER — Encounter (HOSPITAL_COMMUNITY): Payer: Self-pay | Admitting: Certified Registered Nurse Anesthetist

## 2018-10-03 ENCOUNTER — Other Ambulatory Visit: Payer: Self-pay

## 2018-10-03 ENCOUNTER — Encounter (HOSPITAL_COMMUNITY)
Admission: RE | Disposition: A | Discharge status: Home / Self Care | Payer: Self-pay | Source: Ambulatory Visit | Attending: Surgery

## 2018-10-03 DIAGNOSIS — Z885 Allergy status to narcotic agent status: Secondary | ICD-10-CM | POA: Insufficient documentation

## 2018-10-03 DIAGNOSIS — N448 Other noninflammatory disorders of the testis: Secondary | ICD-10-CM | POA: Insufficient documentation

## 2018-10-03 DIAGNOSIS — Z841 Family history of disorders of kidney and ureter: Secondary | ICD-10-CM | POA: Diagnosis not present

## 2018-10-03 DIAGNOSIS — Z8719 Personal history of other diseases of the digestive system: Secondary | ICD-10-CM

## 2018-10-03 DIAGNOSIS — Z79899 Other long term (current) drug therapy: Secondary | ICD-10-CM | POA: Diagnosis not present

## 2018-10-03 DIAGNOSIS — N433 Hydrocele, unspecified: Secondary | ICD-10-CM | POA: Diagnosis present

## 2018-10-03 DIAGNOSIS — Z801 Family history of malignant neoplasm of trachea, bronchus and lung: Secondary | ICD-10-CM | POA: Diagnosis not present

## 2018-10-03 DIAGNOSIS — Z9889 Other specified postprocedural states: Secondary | ICD-10-CM

## 2018-10-03 HISTORY — PX: EXCISION OF MESH: SHX6268

## 2018-10-03 HISTORY — PX: HYDROCELE EXCISION: SHX482

## 2018-10-03 HISTORY — PX: INGUINAL HERNIA REPAIR: SHX194

## 2018-10-03 SURGERY — HYDROCELECTOMY
Anesthesia: General | Laterality: Right

## 2018-10-03 MED ORDER — DEXAMETHASONE SODIUM PHOSPHATE 10 MG/ML IJ SOLN
INTRAMUSCULAR | Status: DC | PRN
  Administered 2018-10-03: 10 mg via INTRAVENOUS

## 2018-10-03 MED ORDER — ONDANSETRON 4 MG PO TBDP: 4.0000 mg | ORAL_TABLET | Freq: Four times a day (QID) | ORAL | Status: DC | PRN

## 2018-10-03 MED ORDER — DEXAMETHASONE SODIUM PHOSPHATE 10 MG/ML IJ SOLN
INTRAMUSCULAR | Status: AC
  Filled 2018-10-03: qty 1

## 2018-10-03 MED ORDER — BUPIVACAINE HCL (PF) 0.5 % IJ SOLN
INTRAMUSCULAR | Status: DC | PRN
  Administered 2018-10-03: 30 mL

## 2018-10-03 MED ORDER — FENTANYL CITRATE (PF) 100 MCG/2ML IJ SOLN
INTRAMUSCULAR | Status: AC
  Administered 2018-10-03: 50 ug via INTRAVENOUS
  Filled 2018-10-03: qty 2

## 2018-10-03 MED ORDER — ONDANSETRON HCL 4 MG/2ML IJ SOLN
INTRAMUSCULAR | Status: AC
  Filled 2018-10-03: qty 2

## 2018-10-03 MED ORDER — PROPOFOL 10 MG/ML IV BOLUS
INTRAVENOUS | Status: AC
  Filled 2018-10-03: qty 40

## 2018-10-03 MED ORDER — FENTANYL CITRATE (PF) 100 MCG/2ML IJ SOLN
INTRAMUSCULAR | Status: AC
  Filled 2018-10-03: qty 2

## 2018-10-03 MED ORDER — APREPITANT 40 MG PO CAPS
40.0000 mg | ORAL_CAPSULE | Freq: Once | ORAL | Status: AC
  Administered 2018-10-03: 40 mg via ORAL
  Filled 2018-10-03: qty 1

## 2018-10-03 MED ORDER — MIDAZOLAM HCL 5 MG/5ML IJ SOLN
INTRAMUSCULAR | Status: DC | PRN
  Administered 2018-10-03: 2 mg via INTRAVENOUS

## 2018-10-03 MED ORDER — PHENYLEPHRINE HCL 10 MG/ML IJ SOLN
INTRAMUSCULAR | Status: AC
  Filled 2018-10-03: qty 1

## 2018-10-03 MED ORDER — HYDROMORPHONE HCL 1 MG/ML IJ SOLN
INTRAMUSCULAR | Status: AC
  Administered 2018-10-03: 1 mg via INTRAVENOUS
  Filled 2018-10-03: qty 1

## 2018-10-03 MED ORDER — PROPOFOL 10 MG/ML IV BOLUS
INTRAVENOUS | Status: AC
  Filled 2018-10-03: qty 20

## 2018-10-03 MED ORDER — LACTATED RINGERS IV SOLN
INTRAVENOUS | Status: DC
  Administered 2018-10-03: 08:00:00 via INTRAVENOUS

## 2018-10-03 MED ORDER — PHENYLEPHRINE 40 MCG/ML (10ML) SYRINGE FOR IV PUSH (FOR BLOOD PRESSURE SUPPORT)
PREFILLED_SYRINGE | INTRAVENOUS | Status: DC | PRN
  Administered 2018-10-03 (×2): 80 ug via INTRAVENOUS

## 2018-10-03 MED ORDER — EPINEPHRINE PF 1 MG/10ML IJ SOSY
PREFILLED_SYRINGE | INTRAMUSCULAR | Status: DC | PRN
  Administered 2018-10-03: 100 ug via SUBCUTANEOUS

## 2018-10-03 MED ORDER — SCOPOLAMINE 1 MG/3DAYS TD PT72
1.0000 | MEDICATED_PATCH | Freq: Once | TRANSDERMAL | Status: DC
  Administered 2018-10-03: 1.5 mg via TRANSDERMAL
  Filled 2018-10-03: qty 1

## 2018-10-03 MED ORDER — ACETAMINOPHEN 500 MG PO TABS
1000.0000 mg | ORAL_TABLET | ORAL | Status: AC
  Administered 2018-10-03: 1000 mg via ORAL
  Filled 2018-10-03: qty 2

## 2018-10-03 MED ORDER — PROMETHAZINE HCL 25 MG/ML IJ SOLN
INTRAMUSCULAR | Status: AC
  Administered 2018-10-03: 6.5 mg
  Filled 2018-10-03: qty 1

## 2018-10-03 MED ORDER — DIPHENHYDRAMINE HCL 25 MG PO CAPS: 25.0000 mg | ORAL_CAPSULE | Freq: Four times a day (QID) | ORAL | Status: DC | PRN

## 2018-10-03 MED ORDER — HYDROMORPHONE HCL 1 MG/ML IJ SOLN
INTRAMUSCULAR | Status: AC
  Administered 2018-10-03: 0.5 mg via INTRAVENOUS
  Filled 2018-10-03: qty 1

## 2018-10-03 MED ORDER — ONDANSETRON HCL 4 MG/2ML IJ SOLN
INTRAMUSCULAR | Status: DC | PRN
  Administered 2018-10-03: 4 mg via INTRAVENOUS

## 2018-10-03 MED ORDER — CHLORHEXIDINE GLUCONATE CLOTH 2 % EX PADS: 6.0000 | MEDICATED_PAD | Freq: Once | CUTANEOUS | Status: DC

## 2018-10-03 MED ORDER — PROPOFOL 500 MG/50ML IV EMUL
INTRAVENOUS | Status: DC | PRN
  Administered 2018-10-03: 100 ug/kg/min via INTRAVENOUS

## 2018-10-03 MED ORDER — CEFAZOLIN SODIUM-DEXTROSE 2-4 GM/100ML-% IV SOLN: 2.0000 g | INTRAVENOUS | Status: DC

## 2018-10-03 MED ORDER — ACETAMINOPHEN 10 MG/ML IV SOLN
INTRAVENOUS | Status: AC
  Filled 2018-10-03: qty 100

## 2018-10-03 MED ORDER — ENOXAPARIN SODIUM 40 MG/0.4ML ~~LOC~~ SOLN
40.0000 mg | SUBCUTANEOUS | Status: DC
  Administered 2018-10-04: 40 mg via SUBCUTANEOUS
  Filled 2018-10-03: qty 0.4

## 2018-10-03 MED ORDER — SCOPOLAMINE 1 MG/3DAYS TD PT72
MEDICATED_PATCH | TRANSDERMAL | Status: AC
  Administered 2018-10-03: 1.5 mg via TRANSDERMAL
  Filled 2018-10-03: qty 1

## 2018-10-03 MED ORDER — METHOCARBAMOL 500 MG PO TABS: 500.0000 mg | ORAL_TABLET | Freq: Four times a day (QID) | ORAL | Status: DC | PRN

## 2018-10-03 MED ORDER — ACETAMINOPHEN 10 MG/ML IV SOLN: 1000.0000 mg | Freq: Once | INTRAVENOUS | Status: DC | PRN

## 2018-10-03 MED ORDER — LIDOCAINE 2% (20 MG/ML) 5 ML SYRINGE
INTRAMUSCULAR | Status: DC | PRN
  Administered 2018-10-03: 100 mg via INTRAVENOUS

## 2018-10-03 MED ORDER — PROPOFOL 10 MG/ML IV BOLUS
INTRAVENOUS | Status: DC | PRN
  Administered 2018-10-03: 200 mg via INTRAVENOUS

## 2018-10-03 MED ORDER — FENTANYL CITRATE (PF) 100 MCG/2ML IJ SOLN
INTRAMUSCULAR | Status: DC | PRN
  Administered 2018-10-03 (×4): 50 ug via INTRAVENOUS

## 2018-10-03 MED ORDER — BUPIVACAINE HCL (PF) 0.25 % IJ SOLN
INTRAMUSCULAR | Status: AC
  Filled 2018-10-03: qty 30

## 2018-10-03 MED ORDER — PHENYLEPHRINE HCL 10 MG/ML IJ SOLN
INTRAVENOUS | Status: DC | PRN
  Administered 2018-10-03: 25 ug/min via INTRAVENOUS

## 2018-10-03 MED ORDER — POTASSIUM CHLORIDE IN NACL 20-0.9 MEQ/L-% IV SOLN
INTRAVENOUS | Status: DC
  Administered 2018-10-03 (×2): via INTRAVENOUS
  Filled 2018-10-03 (×2): qty 1000

## 2018-10-03 MED ORDER — DIPHENHYDRAMINE HCL 50 MG/ML IJ SOLN: 25.0000 mg | Freq: Four times a day (QID) | INTRAMUSCULAR | Status: DC | PRN

## 2018-10-03 MED ORDER — PROMETHAZINE HCL 25 MG/ML IJ SOLN
12.5000 mg | Freq: Four times a day (QID) | INTRAMUSCULAR | Status: DC | PRN
  Administered 2018-10-03 – 2018-10-04 (×2): 12.5 mg via INTRAVENOUS
  Filled 2018-10-03 (×2): qty 1

## 2018-10-03 MED ORDER — ONDANSETRON HCL 4 MG/2ML IJ SOLN: 4.0000 mg | Freq: Four times a day (QID) | INTRAMUSCULAR | Status: DC | PRN

## 2018-10-03 MED ORDER — 0.9 % SODIUM CHLORIDE (POUR BTL) OPTIME
TOPICAL | Status: DC | PRN
  Administered 2018-10-03: 1000 mL

## 2018-10-03 MED ORDER — OXYCODONE-ACETAMINOPHEN 5-325 MG PO TABS
1.0000 | ORAL_TABLET | ORAL | Status: DC | PRN
  Administered 2018-10-03 – 2018-10-04 (×2): 2 via ORAL
  Filled 2018-10-03 (×3): qty 2

## 2018-10-03 MED ORDER — CEFAZOLIN SODIUM-DEXTROSE 2-4 GM/100ML-% IV SOLN
2.0000 g | INTRAVENOUS | Status: AC
  Administered 2018-10-03: 2 g via INTRAVENOUS
  Filled 2018-10-03: qty 100

## 2018-10-03 MED ORDER — GABAPENTIN 300 MG PO CAPS
300.0000 mg | ORAL_CAPSULE | ORAL | Status: AC
  Administered 2018-10-03: 300 mg via ORAL
  Filled 2018-10-03: qty 1

## 2018-10-03 MED ORDER — BUPIVACAINE LIPOSOME 1.3 % IJ SUSP
20.0000 mL | Freq: Once | INTRAMUSCULAR | Status: DC
  Filled 2018-10-03: qty 20

## 2018-10-03 MED ORDER — GABAPENTIN 300 MG PO CAPS
900.0000 mg | ORAL_CAPSULE | Freq: Three times a day (TID) | ORAL | Status: DC
  Administered 2018-10-03 – 2018-10-04 (×3): 900 mg via ORAL
  Filled 2018-10-03 (×3): qty 3

## 2018-10-03 MED ORDER — LIDOCAINE 2% (20 MG/ML) 5 ML SYRINGE
INTRAMUSCULAR | Status: AC
  Filled 2018-10-03: qty 5

## 2018-10-03 MED ORDER — PHENYLEPHRINE 40 MCG/ML (10ML) SYRINGE FOR IV PUSH (FOR BLOOD PRESSURE SUPPORT)
PREFILLED_SYRINGE | INTRAVENOUS | Status: AC
  Filled 2018-10-03: qty 10

## 2018-10-03 MED ORDER — FENTANYL CITRATE (PF) 100 MCG/2ML IJ SOLN
25.0000 ug | INTRAMUSCULAR | Status: DC | PRN
  Administered 2018-10-03 (×2): 50 ug via INTRAVENOUS

## 2018-10-03 MED ORDER — HYDROMORPHONE HCL 1 MG/ML IJ SOLN
1.0000 mg | INTRAMUSCULAR | Status: DC | PRN
  Administered 2018-10-03 (×2): 1 mg via INTRAVENOUS
  Administered 2018-10-03 (×2): 0.5 mg via INTRAVENOUS
  Administered 2018-10-03 – 2018-10-04 (×5): 1 mg via INTRAVENOUS
  Filled 2018-10-03 (×4): qty 1

## 2018-10-03 MED ORDER — HYDROMORPHONE HCL 1 MG/ML IJ SOLN: 0.2500 mg | INTRAMUSCULAR | Status: DC | PRN

## 2018-10-03 MED ORDER — ROPIVACAINE HCL 5 MG/ML IJ SOLN
INTRAMUSCULAR | Status: DC | PRN
  Administered 2018-10-03: 30 mL via PERINEURAL

## 2018-10-03 MED ORDER — BUPIVACAINE HCL (PF) 0.5 % IJ SOLN
INTRAMUSCULAR | Status: AC
  Filled 2018-10-03: qty 30

## 2018-10-03 MED ORDER — MIDAZOLAM HCL 2 MG/2ML IJ SOLN
INTRAMUSCULAR | Status: AC
  Filled 2018-10-03: qty 2

## 2018-10-03 SURGICAL SUPPLY — 54 items
ADH SKN CLS APL DERMABOND .7 (GAUZE/BANDAGES/DRESSINGS) ×2
BLADE CLIPPER SURG (BLADE) ×4 IMPLANT
BLADE HEX COATED 2.75 (ELECTRODE) ×4 IMPLANT
BLADE SURG 15 STRL LF DISP TIS (BLADE) ×1 IMPLANT
BLADE SURG 15 STRL SS (BLADE)
BNDG GAUZE ELAST 4 BULKY (GAUZE/BANDAGES/DRESSINGS) ×2 IMPLANT
CHLORAPREP W/TINT 26ML (MISCELLANEOUS) ×1 IMPLANT
COVER SURGICAL LIGHT HANDLE (MISCELLANEOUS) ×4 IMPLANT
COVER WAND RF STERILE (DRAPES) IMPLANT
DECANTER SPIKE VIAL GLASS SM (MISCELLANEOUS) IMPLANT
DERMABOND ADVANCED (GAUZE/BANDAGES/DRESSINGS) ×2
DERMABOND ADVANCED .7 DNX12 (GAUZE/BANDAGES/DRESSINGS) ×4 IMPLANT
DRAIN PENROSE 18X1/2 LTX STRL (DRAIN) ×2 IMPLANT
DRAIN PENROSE 18X1/4 LTX STRL (WOUND CARE) ×2 IMPLANT
DRAPE LAPAROTOMY T 98X78 PEDS (DRAPES) ×4 IMPLANT
DRAPE LAPAROTOMY TRNSV 102X78 (DRAPE) ×1 IMPLANT
ELECT PENCIL ROCKER SW 15FT (MISCELLANEOUS) ×2 IMPLANT
ELECT REM PT RETURN 15FT ADLT (MISCELLANEOUS) ×5 IMPLANT
GAUZE SPONGE 4X4 12PLY STRL (GAUZE/BANDAGES/DRESSINGS) IMPLANT
GLOVE BIO SURGEON STRL SZ7.5 (GLOVE) ×4 IMPLANT
GLOVE SURG SIGNA 7.5 PF LTX (GLOVE) ×4 IMPLANT
GOWN STRL REUS W/TWL LRG LVL3 (GOWN DISPOSABLE) ×4 IMPLANT
GOWN STRL REUS W/TWL XL LVL3 (GOWN DISPOSABLE) ×6 IMPLANT
KIT BASIN OR (CUSTOM PROCEDURE TRAY) ×6 IMPLANT
NDL HYPO 25X1 1.5 SAFETY (NEEDLE) ×1 IMPLANT
NEEDLE HYPO 22GX1.5 SAFETY (NEEDLE) ×2 IMPLANT
NEEDLE HYPO 25X1 1.5 SAFETY (NEEDLE) IMPLANT
NS IRRIG 1000ML POUR BTL (IV SOLUTION) ×1 IMPLANT
PACK BASIC VI WITH GOWN DISP (CUSTOM PROCEDURE TRAY) ×2 IMPLANT
PACK GENERAL/GYN (CUSTOM PROCEDURE TRAY) ×4 IMPLANT
SOL PREP PROV IODINE SCRUB 4OZ (MISCELLANEOUS) ×4 IMPLANT
SPONGE LAP 4X18 RFD (DISPOSABLE) ×2 IMPLANT
SUPPORT SCROTAL LG STRP (MISCELLANEOUS) ×1 IMPLANT
SUPPORTER ATHLETIC LG (MISCELLANEOUS)
SUT CHROMIC 3 0 SH 27 (SUTURE) ×4 IMPLANT
SUT MNCRL AB 4-0 PS2 18 (SUTURE) ×4 IMPLANT
SUT NOVA NAB DX-16 0-1 5-0 T12 (SUTURE) ×6 IMPLANT
SUT SILK 2 0 SH (SUTURE) IMPLANT
SUT SILK 3 0 (SUTURE) ×4
SUT SILK 3 0 SH 30 (SUTURE) ×2 IMPLANT
SUT SILK 3-0 18XBRD TIE 12 (SUTURE) IMPLANT
SUT VIC AB 2-0 CT1 27 (SUTURE)
SUT VIC AB 2-0 CT1 TAPERPNT 27 (SUTURE) IMPLANT
SUT VIC AB 2-0 UR5 27 (SUTURE) IMPLANT
SUT VIC AB 3-0 54XBRD REEL (SUTURE) IMPLANT
SUT VIC AB 3-0 BRD 54 (SUTURE)
SUT VIC AB 3-0 SH 27 (SUTURE) ×8
SUT VIC AB 3-0 SH 27XBRD (SUTURE) IMPLANT
SUT VICRYL 0 TIES 12 18 (SUTURE) IMPLANT
SYR CONTROL 10ML LL (SYRINGE) ×4 IMPLANT
TOWEL OR 17X26 10 PK STRL BLUE (TOWEL DISPOSABLE) ×6 IMPLANT
TOWEL OR NON WOVEN STRL DISP B (DISPOSABLE) ×4 IMPLANT
WATER STERILE IRR 1000ML POUR (IV SOLUTION) ×1 IMPLANT
YANKAUER SUCT BULB TIP 10FT TU (MISCELLANEOUS) IMPLANT

## 2018-10-03 NOTE — Op Note (Signed)
RIGHT ORCHIECTOMY  Procedure Note  [name] 10/03/2018   Pre-op Diagnosis: RIGHT TESTICULAR PAIN, HYDROCELE     Post-op Diagnosis: same  Procedure(Wyatt): RIGHT ORCHIECTOMY RIGHT GROIN EXPLORATION REMOVAL OF MESH  Surgeon(Wyatt): [name], Brett D III, MD [name], Brett Pompei, MD  Anesthesia: General  Staff:  Circulator: [name], Brett S, RN Scrub Person: [name], Brett A, RN  Estimated Blood Loss: Minimal               Specimens: sent to path  Indications: This is Wyatt 57 year old gentleman whose had Wyatt previous right inguinal hernia repair with mesh in 2011.  He had repeat surgery for removal of mesh and placement of new mesh in 2015.  He has had chronic right groin pain as well as Wyatt chronic recurrent hydrocele since that surgery.  He had an acute exacerbation of pain at the end of summer and early fall of uncertain etiology.  Work-up including CT and MRI was unremarkable.  Because of his continued pain and Wyatt long discussion with him and his wife, the decision was made to remove his previously placed mesh and explore the groin with urology to see if he indeed Tuft need Wyatt orchiectomy and removal of the cord to see if this will relieve his chronic with acutely exacerbated groin pain  Findings: The previously placed mesh was in good location.  There was no evidence of hernia recurrence.  There were no sutures involved with any nerve structures identified.  There was Wyatt large hydrocele.  The testicular cord was viable.  The decision was made to remove the patient previously placed mesh and proceed with an orchiectomy and removal of the testicular cord.  Procedure: The patient was brought to the operating room and identified as the correct patient.  He was placed supine on the operating room table and general anesthesia was induced.  His abdomen and groin were then prepped and draped in usual sterile fashion.  I anesthetized the skin with Marcaine and then made Wyatt longitudinal incision through the  previous scar with the scalpel.  I dissected down through Scarpa'Wyatt fascia with electrocautery.  There was extensive dark tissue and Scarpa'Wyatt fascia as well as the external oblique fascial which was then opened revealing the mesh.  The mesh was deeply adherent to the surrounding tissue.  I opened up the mesh to the middle with the scalpel.  We were then able to identify the testicular cord.  We control this with Wyatt Penrose drain.  At this point again, no direct cause of his discomfort was identified.  The urologist proceeded with an orchiectomy and excision of the testicular cord as it was thought this Jaquess be Wyatt cause of the pain given the large hydrocele.  Once this was completed I excised the mesh from the surrounding fascia with the cautery.  All the mesh appeared to be removed.  I then had to make relaxing incisions in the transversalis fascia in order to re-create the floor of the inguinal canal.  I then sutured the fascia back together with interrupted and figure-of-eight #1 Novafil sutures.  I then closed externally fascia over the top of this with interrupted #1 Novafil sutures as well.  Hemostasis appeared to be achieved.  I anesthetized the wound further with Marcaine.  Again, there was extensive thick scarring throughout and an ilioinguinal nerve could not be visualized.  I then closed Scarpa'Wyatt fascia with interrupted 3-0 Vicryl sutures and closed the skin with Wyatt running 4-0 Monocryl.  Dermabond was then  applied.  The patient tolerated procedure well.  All the counts were correct at the end of procedure.  The patient was then extubated in the operating room and taken in Wyatt stable condition to the recovery room.          [name]   Date: 10/03/2018  Time: 10:01 AM

## 2018-10-03 NOTE — Anesthesia Procedure Notes (Signed)
Procedure Name: LMA Insertion Date/Time: 10/03/2018 8:47 AM Performed by: Orest DikesPeters, Denyse Fillion J, CRNA Pre-anesthesia Checklist: Patient identified, Emergency Drugs available, Suction available and Patient being monitored Patient Re-evaluated:Patient Re-evaluated prior to induction Oxygen Delivery Method: Circle system utilized Preoxygenation: Pre-oxygenation with 100% oxygen Induction Type: IV induction LMA: LMA inserted LMA Size: 4.0 Number of attempts: 1 Placement Confirmation: positive ETCO2 and breath sounds checked- equal and bilateral Tube secured with: Tape Dental Injury: Teeth and Oropharynx as per pre-operative assessment

## 2018-10-03 NOTE — H&P (Signed)
CC/HPI: Cc: Right inguinal pain/testicular pain and right hydrocele  HPI:  07/26/2018  The patient has undergone 2 right inguinal hernia repairs with mesh. The last time was 1.5 years ago. Since then, he has had severe right inguinal testicular pain. Dr. Rayburn MaBlackmon performed an inguinal nerve block without relief. The patient also developed a right-sided hydrocele after the surgery. This has been aspirated every few weeks since the surgery. He is significantly bothered by the pain and discomfort. Dr. Magnus IvanBlackman is planning to possibly perform a right inguinal exploration with mesh removal and would like me present for possible hydrocelectomy versus simple orchiectomy.     ALLERGIES: Toradol SOLN    MEDICATIONS: Percocet  Neurontin     GU PSH: None     PSH Notes: Hernia Repair, Hernia Repair, Neck Surgery   NON-GU PSH: Hernia Repair - 2013, 2013    GU PMH: ED due to arterial insufficiency, Erectile dysfunction due to arterial insufficiency - 2014 Hydrocele, Unspec, Hydrocele, right - 2014 Other microscopic hematuria, Microscopic hematuria - 2014 Renal calculus, Kidney stone on right side - 2014 Testicular pain, unspecified, Testicular pain - 2014 Unil Inguinal Hernia W/O obst or gang,non-recurrent, Inguinal hernia, right - 2014      PMH Notes:  2010-03-11 10:56:38 - Note: No Medical Problems   NON-GU PMH: Encounter for general adult medical examination without abnormal findings, Encounter for preventive health examination    FAMILY HISTORY: Colon Cancer - Father Death In The Family Father - Runs In Family Family Health Status Number - Runs In Family Lung Cancer - Father nephrolithiasis - Father   SOCIAL HISTORY: None    Notes: Tobacco use, Physical Disability Affecting Ability To Work, Marital History - Currently Married, Caffeine Use, Alcohol Use   REVIEW OF SYSTEMS:    GU Review Male:   Patient reports get up at night to urinate. Patient denies frequent urination, hard to  postpone urination, burning/ pain with urination, leakage of urine, stream starts and stops, trouble starting your stream, have to strain to urinate , erection problems, and penile pain.  Gastrointestinal (Upper):   Patient denies nausea, vomiting, and indigestion/ heartburn.  Gastrointestinal (Lower):   Patient denies diarrhea and constipation.  Constitutional:   Patient denies fatigue, weight loss, fever, and night sweats.  Skin:   Patient denies skin rash/ lesion and itching.  Eyes:   Patient denies blurred vision and double vision.  Ears/ Nose/ Throat:   Patient denies sore throat and sinus problems.  Hematologic/Lymphatic:   Patient denies swollen glands and easy bruising.  Cardiovascular:   Patient denies leg swelling and chest pains.  Respiratory:   Patient reports cough. Patient denies shortness of breath.  Endocrine:   Patient denies excessive thirst.  Musculoskeletal:   Patient denies back pain and joint pain.  Neurological:   Patient reports headaches. Patient denies dizziness.  Psychologic:   Patient denies depression and anxiety.   Notes: painful intercourse    VITAL SIGNS:      07/26/2018 11:29 AM  Weight 174 lb / 78.93 kg  Height 72 in / 182.88 cm  BP 110/74 mmHg  Heart Rate 81 /min  BMI 23.6 kg/m   GU PHYSICAL EXAMINATION:    Scrotum: No lesions. No edema. No cysts. No warts.  Testes: Left testicle normal. Right testicle palpable but he has a palpable right-sided hydrocele.  Urethral Meatus: Normal size. No lesion, no wart, no discharge, no polyp. Normal location.  Penis: Circumcised, no warts, no cracks. No dorsal Peyronie's plaques, no  left corporal Peyronie's plaques, no right corporal Peyronie's plaques, no scarring, no warts. No balanitis, no meatal stenosis.   MULTI-SYSTEM PHYSICAL EXAMINATION:    Constitutional: Well-nourished. No physical deformities. Normally developed. Good grooming.  Respiratory: No labored breathing, no use of accessory muscles.    Cardiovascular: Normal temperature, adequate perfusion of extremities  Skin: No paleness, no jaundice  Neurologic / Psychiatric: Oriented to time, oriented to place, oriented to person. No depression, no anxiety, no agitation.  Gastrointestinal: No mass, no tenderness, no rigidity, non obese abdomen.  Eyes: Normal conjunctivae. Normal eyelids.  Musculoskeletal: Normal gait and station of head and neck.     PAST DATA REVIEWED:  Source Of History:  Patient  X-Ray Review: MRI Pelvis: Reviewed Films. Reviewed Report. Discussed With Patient. MRI from 07/04/2018 revealed evidence of right hydrocele    PROCEDURES:          Urinalysis w/Scope Dipstick Dipstick Cont'd Micro  Color: Yellow Bilirubin: Neg mg/dL WBC/hpf: NS (Not Seen)  Appearance: Clear Ketones: Neg mg/dL RBC/hpf: 0 - 2/hpf  Specific Gravity: 1.025 Blood: Trace ery/uL Bacteria: NS (Not Seen)  pH: 5.5 Protein: Neg mg/dL Cystals: NS (Not Seen)  Glucose: Neg mg/dL Urobilinogen: 0.2 mg/dL Casts: NS (Not Seen)    Nitrites: Neg Trichomonas: Not Present    Leukocyte Esterase: Neg leu/uL Mucous: Present      Epithelial Cells: NS (Not Seen)      Yeast: NS (Not Seen)      Sperm: Not Present    ASSESSMENT:      ICD-10 Details  1 GU:   Hydrocele - N43.0   2   Right testicular pain - N50.811    PLAN:           Document Letter(s):  Created for Patient: Clinical Summary         Notes:   Right-sided nerve discomfort from his prior surgery. Mesh removal [place] or [place] not improve this. In addition, testicular removal also Tippets not improve the pain. I talked to him about hydrocelectomy as well as right simple orchiectomy. I'll plan to perform the surgery in combination with Dr. Magnus Ivan. He'll be consented for right hydrocelectomy with possible right simple orchiectomy. He understands that this [place] not improve his pain. There Santa even possibly be a risk for increased pain.   I spoke to patient in detail about this.  He would like to proceed  with simple orchiectomy.  We will do this through the inguinal incision and take as much of the cord as well and hopefully this will fix his pain.

## 2018-10-03 NOTE — Interval H&P Note (Signed)
History and Physical Interval Note:no change in H and P  10/03/2018 7:34 AM  [name]  has presented today for surgery, with the diagnosis of RIGHT TESTICULAR PAIN, HYDROCELE  The various methods of treatment have been discussed with the patient and family. After consideration of risks, benefits and other options for treatment, the patient has consented to  Procedure(s): RIGHT HYDROCELECTOMY ADULT POSSIBLE SIMPLE ORCHIECTOMY (Right) RIGHT GROIN EXPLORATION (Right) REMOVAL OF MESH (N/A) as a surgical intervention .  The patient's history has been reviewed, patient examined, no change in status, stable for surgery.  I have reviewed the patient's chart and labs.  Questions were answered to the patient's satisfaction.     [name] A

## 2018-10-03 NOTE — Op Note (Signed)
Operative Note  Preoperative diagnosis:  1.  Chronic right testicular pain, right hydrocele  Postoperative diagnosis: 1.  Same  Procedure(s): 1.  Right inguinal orchiectomy  Surgeon: Modena SlaterEugene Bell, MD  Assistants: Dr. Abigail Miyamotoouglas Blackman  Anesthesia: General  Complications: None immediate  EBL: Minimal for my portion  Specimens: 1.  Right testicle  Drains/Catheters: 1.  None  Intraoperative findings: Right hydrocele  Indication: 57 year old male status post 2 right inguinal hernia repairs with mesh.  Last time was about 1-1/2 years ago.  Since then, he has had chronic right sided inguinal as well as testicular pain.  Exam of the testicle and epididymis reproduces the patient's pain.  Given this finding, the patient greatly desires pain relief and after discussion of different options, he elected to proceed with right inguinal orchiectomy in combination with mesh removal.  Description of procedure:  The patient was identified and consent was obtained.  The patient was taken to the operating room and placed in the supine position.  The patient was placed under general anesthesia.  Perioperative antibiotics were administered.  Patient was prepped and draped in a standard sterile fashion and a timeout was performed.  Dr. Magnus IvanBlackman made a right sided inguinal incision and carried this down through the fascial layers down to the level of the mesh.  He excise the mesh.  We identified the spermatic cord and passed a Penrose drain around it.  I then delivered the testicle onto the operative field through the inguinal incision.  I released gubernacular attachments until the entire testicle was free.  I separated the vas deferens away from the cord and placed a clamp around this followed by 2 clamps around the vascular portion of the cord.  I used 2-0 Vicryl ties to tie off the vas deferens.  I used a stick tie on the distal portion of the cord followed by a 2-0 Vicryl tie to tie off the vascular  portion.  I inspected the stump and there was no active bleeding noted.  This concluded my portion of the operation.  I turned the case over to Dr. Magnus IvanBlackman.  Plan: Patient can follow-up with me in several weeks to see how he is doing.  Hopefully removal of the testicle helps with his pain.

## 2018-10-03 NOTE — Anesthesia Procedure Notes (Signed)
Anesthesia Regional Block: TAP block   Pre-Anesthetic Checklist: ,, timeout performed, Correct Patient, Correct Site, Correct Laterality, Correct Procedure, Correct Position, site marked, Risks and benefits discussed,  Surgical consent,  Pre-op evaluation,  At surgeon's request and post-op pain management  Laterality: Right  Prep: Maximum Sterile Barrier Precautions used, chloraprep       Needles:  Injection technique: Single-shot  Needle Type: Echogenic Stimulator Needle     Needle Length: 9cm  Needle Gauge: 22     Additional Needles:   Procedures:,,,, ultrasound used (permanent image in chart),,,,  Narrative:  Start time: 10/03/2018 10:42 AM End time: 10/03/2018 10:52 AM Injection made incrementally with aspirations every 5 mL.  Performed by: Personally  Anesthesiologist: Elmer PickerWoodrum, Ethan Kasperski L, MD  Additional Notes: Monitors applied. No increased pain on injection. No increased resistance to injection. Injection made in 5cc increments. Good needle visualization. Patient tolerated procedure well.

## 2018-10-03 NOTE — Transfer of Care (Signed)
Immediate Anesthesia Transfer of Care Note  Patient: [name]  Procedure(s) Performed: RIGHT ORCHIECTOMY (Right ) RIGHT GROIN EXPLORATION (Right ) REMOVAL OF MESH (N/A )  Patient Location: PACU  Anesthesia Type:General  Level of Consciousness: awake, alert  and oriented  Airway & Oxygen Therapy: Patient Spontanous Breathing and Patient connected to face mask oxygen  Post-op Assessment: Report given to RN and Post -op Vital signs reviewed and stable  Post vital signs: Reviewed and stable  Last Vitals:  Vitals Value Taken Time  BP 114/85 10/03/2018 10:04 AM  Temp    Pulse 65 10/03/2018 10:06 AM  Resp 15 10/03/2018 10:06 AM  SpO2 99 % 10/03/2018 10:06 AM  Vitals shown include unvalidated device data.  Last Pain:  Vitals:   10/03/18 0724  TempSrc:   PainSc: 3       Patients Stated Pain Goal: 4 (10/03/18 0724)  Complications: No apparent anesthesia complications

## 2018-10-04 ENCOUNTER — Encounter (HOSPITAL_COMMUNITY): Payer: Self-pay | Admitting: Urology

## 2018-10-04 DIAGNOSIS — N433 Hydrocele, unspecified: Secondary | ICD-10-CM | POA: Diagnosis not present

## 2018-10-04 MED ORDER — OXYCODONE-ACETAMINOPHEN 5-325 MG PO TABS: 1.0000 | ORAL_TABLET | Freq: Four times a day (QID) | ORAL | 0 refills | Status: DC | PRN

## 2018-10-04 NOTE — Discharge Summary (Signed)
Physician Discharge Summary  Patient ID: [name] MRN: [id number] DOB/AGE: 07-11-1961 57 y.o.  Admit date: 10/03/2018 Discharge date: 10/04/2018  Admission Diagnoses:  Discharge Diagnoses:  Active Problems:   S/P inguinal hernia repair chronic right groin pain  Discharged Condition: good  Hospital Course: uneventful post op recovery.  Discharged home POD#1  Consults: Urology  Significant Diagnostic Studies:   Treatments: surgery: right groin exploration, removal of mesh, orchiectomy  Discharge Exam: Blood pressure 107/71, pulse (!) 46, temperature 97.7 F (36.5 C), temperature source Oral, resp. rate 16, height 6' (1.829 m), weight 81.6 kg, SpO2 97 %. General appearance: alert, cooperative and no distress Resp: clear to auscultation bilaterally Cardio: regular rate and rhythm, S1, S2 normal, no murmur, click, rub or gallop Incision/Wound:abdomen soft, incision clean without hematoma  Disposition: Discharge disposition: 01-Home or Self Care       Discharge Instructions    Call MD for:   Complete by:  As directed    Call MD for:  redness, tenderness, or signs of infection (pain, swelling, redness, odor or green/yellow discharge around incision site)   Complete by:  As directed    Call MD for:  severe uncontrolled pain   Complete by:  As directed    Diet - low sodium heart healthy   Complete by:  As directed    Increase activity slowly   Complete by:  As directed      Allergies as of 10/04/2018      Reactions   Codeine Nausea And Vomiting   Ketorolac Tromethamine Nausea And Vomiting   Morphine Nausea And Vomiting      Medication List    TAKE these medications   gabapentin 300 MG capsule Commonly known as:  NEURONTIN Take 900 mg by mouth 3 (three) times daily.   oxyCODONE-acetaminophen 5-325 MG tablet Commonly known as:  PERCOCET/ROXICET Take 1 tablet by mouth every 6 (six) hours as needed for moderate pain (pain). What changed:  reasons to take  this   promethazine 25 MG tablet Commonly known as:  PHENERGAN Take 25 mg by mouth 4 (four) times daily as needed for nausea or vomiting.      Follow-up Information    Abigail MiyamotoBlackman, Walburga Hudman, MD. Schedule an appointment as soon as possible for a visit in 3 week(s).   Specialty:  General Surgery Contact information: [address] [place] [address] [phone]           Signed: Shelly RubensteinBLACKMAN,Loic Hobin A 10/04/2018, 7:09 AM

## 2018-10-04 NOTE — Anesthesia Postprocedure Evaluation (Signed)
Anesthesia Post Note  Patient: Ozzie Hoyleimothy R Guerin  Procedure(s) Performed: RIGHT ORCHIECTOMY (Right ) RIGHT GROIN EXPLORATION (Right ) REMOVAL OF MESH (N/A )     Patient location during evaluation: PACU Anesthesia Type: General Level of consciousness: awake and alert Pain management: pain level controlled Vital Signs Assessment: post-procedure vital signs reviewed and stable Respiratory status: spontaneous breathing, nonlabored ventilation, respiratory function stable and patient connected to nasal cannula oxygen Cardiovascular status: blood pressure returned to baseline and stable Postop Assessment: no apparent nausea or vomiting Anesthetic complications: no    Last Vitals:  Vitals:   10/04/18 0154 10/04/18 0449  BP: 97/70 107/71  Pulse: (!) 50 (!) 46  Resp: 16 16  Temp: 36.4 C 36.5 C  SpO2: 98% 97%    Last Pain:  Vitals:   10/04/18 0449  TempSrc: Oral  PainSc:                  Chelsey L Woodrum

## 2018-10-04 NOTE — Progress Notes (Signed)
Pt alert and oriented. Nauseous this morning, gave Phenergan and it helped. D/C instructions and prescription given.  Pt d/cd home.

## 2018-10-04 NOTE — Progress Notes (Signed)
Patient ID: [name], male   DOB: 04-03-1961, 57 y.o.   MRN: [id number]   More comfortable this morning Able to ambulate Abdomen soft with minimal swelling of RLQ incision and scrotum  Plan: D/c home

## 2018-10-04 NOTE — Progress Notes (Signed)
I have reviewed and concur with this student's documentation.   

## 2018-10-04 NOTE — Discharge Instructions (Signed)
[facility] _______[facility]  UMBILICAL OR INGUINAL HERNIA REPAIR: POST OP INSTRUCTIONS  Always review your discharge instruction sheet given to you by the facility where your surgery was performed. IF YOU HAVE DISABILITY OR FAMILY LEAVE FORMS, YOU MUST BRING THEM TO THE OFFICE FOR PROCESSING.   DO NOT GIVE THEM TO YOUR DOCTOR.  1. A  prescription for pain medication Cavallero be given to you upon discharge.  Take your pain medication as prescribed, if needed.  If narcotic pain medicine is not needed, then you Delaguila take acetaminophen (Tylenol) or ibuprofen (Advil) as needed. 2. Take your usually prescribed medications unless otherwise directed. If you need a refill on your pain medication, please contact your pharmacy.  They will contact our office to request authorization. Prescriptions will not be filled after 5 pm or on week-ends. 3. You should follow a light diet the first 24 hours after arrival home, such as soup and crackers, etc.  Be sure to include lots of fluids daily.  Resume your normal diet the day after surgery. 4.Most patients will experience some swelling and bruising around the umbilicus or in the groin and scrotum.  Ice packs and reclining will help.  Swelling and bruising can take several days to resolve.  6. It is common to experience some constipation if taking pain medication after surgery.  Increasing fluid intake and taking a stool softener (such as Colace) will usually help or prevent this problem from occurring.  A mild laxative (Milk of Magnesia or Miralax) should be taken according to package directions if there are no bowel movements after 48 hours. 7. Unless discharge instructions indicate otherwise, you Huffine remove your bandages 24-48 hours after surgery, and you Schult shower at that time.  You Howdyshell have steri-strips (small skin tapes) in place directly over the incision.  These strips should be left on the skin for 7-10 days.  If your surgeon used skin glue on the  incision, you Moehle shower in 24 hours.  The glue will flake off over the next 2-3 weeks.  Any sutures or staples will be removed at the office during your follow-up visit. 8. ACTIVITIES:  You Goguen resume regular (light) daily activities beginning the next day--such as daily self-care, walking, climbing stairs--gradually increasing activities as tolerated.  You Lampi have sexual intercourse when it is comfortable.  Refrain from any heavy lifting or straining until approved by your doctor.  a.You Peto drive when you are no longer taking prescription pain medication, you can comfortably wear a seatbelt, and you can safely maneuver your car and apply brakes. b.RETURN TO WORK:   _____________________________________________  9.You should see your doctor in the office for a follow-up appointment approximately 2-3 weeks after your surgery.  Make sure that you call for this appointment within a day or two after you arrive home to insure a convenient appointment time. 10.OTHER INSTRUCTIONS: __NO LIFTING MORE THAN 15 POUNDS FOR 6 WEEKS OK TO SHOWER ICE PACK AND IBUPROFEN ALSO FOR PAIN_______________________    _____________________________________  WHEN TO CALL YOUR DOCTOR: 1. Fever over 101.0 2. Inability to urinate 3. Nausea and/or vomiting 4. Extreme swelling or bruising 5. Continued bleeding from incision. 6. Increased pain, redness, or drainage from the incision  The clinic staff is available to answer your questions during regular business hours.  Please dont hesitate to call and ask to speak to one of the nurses for clinical concerns.  If you have a medical emergency, go to the nearest emergency room or  call 911.  A surgeon from [facility] is always on call at the hospital   [address], [place], [place], [place]  [address] ?  [address], [place], [place]   [address] [phone] ? [phone] ? FAX [phone] Web site: [email or web address]

## 2018-12-16 ENCOUNTER — Other Ambulatory Visit: Payer: Self-pay

## 2018-12-16 ENCOUNTER — Emergency Department (HOSPITAL_COMMUNITY)
Admission: EM | Admit: 2018-12-16 | Discharge: 2018-12-16 | Disposition: A | Discharge status: Home / Self Care | Payer: Medicare Other | Attending: Emergency Medicine | Admitting: Emergency Medicine

## 2018-12-16 ENCOUNTER — Emergency Department (HOSPITAL_COMMUNITY): Payer: Medicare Other

## 2018-12-16 ENCOUNTER — Encounter (HOSPITAL_COMMUNITY): Payer: Self-pay | Admitting: Obstetrics and Gynecology

## 2018-12-16 DIAGNOSIS — Z9889 Other specified postprocedural states: Secondary | ICD-10-CM | POA: Diagnosis not present

## 2018-12-16 DIAGNOSIS — M25551 Pain in right hip: Secondary | ICD-10-CM | POA: Diagnosis not present

## 2018-12-16 DIAGNOSIS — Y92122 Bedroom in nursing home as the place of occurrence of the external cause: Secondary | ICD-10-CM | POA: Diagnosis not present

## 2018-12-16 DIAGNOSIS — M79671 Pain in right foot: Secondary | ICD-10-CM | POA: Diagnosis not present

## 2018-12-16 DIAGNOSIS — Y999 Unspecified external cause status: Secondary | ICD-10-CM | POA: Diagnosis not present

## 2018-12-16 DIAGNOSIS — S4991XA Unspecified injury of right shoulder and upper arm, initial encounter: Secondary | ICD-10-CM | POA: Diagnosis present

## 2018-12-16 DIAGNOSIS — Y9389 Activity, other specified: Secondary | ICD-10-CM | POA: Diagnosis not present

## 2018-12-16 DIAGNOSIS — Z79899 Other long term (current) drug therapy: Secondary | ICD-10-CM | POA: Insufficient documentation

## 2018-12-16 DIAGNOSIS — F17210 Nicotine dependence, cigarettes, uncomplicated: Secondary | ICD-10-CM | POA: Insufficient documentation

## 2018-12-16 DIAGNOSIS — T07XXXA Unspecified multiple injuries, initial encounter: Secondary | ICD-10-CM

## 2018-12-16 DIAGNOSIS — W19XXXA Unspecified fall, initial encounter: Secondary | ICD-10-CM

## 2018-12-16 DIAGNOSIS — S40011A Contusion of right shoulder, initial encounter: Secondary | ICD-10-CM | POA: Diagnosis not present

## 2018-12-16 DIAGNOSIS — R200 Anesthesia of skin: Secondary | ICD-10-CM | POA: Diagnosis not present

## 2018-12-16 DIAGNOSIS — S7001XA Contusion of right hip, initial encounter: Secondary | ICD-10-CM | POA: Insufficient documentation

## 2018-12-16 DIAGNOSIS — W010XXA Fall on same level from slipping, tripping and stumbling without subsequent striking against object, initial encounter: Secondary | ICD-10-CM | POA: Insufficient documentation

## 2018-12-16 LAB — CBC WITH DIFFERENTIAL/PLATELET
Abs Immature Granulocytes: 0.04 10*3/uL (ref 0.00–0.07)
Basophils Absolute: 0.1 10*3/uL (ref 0.0–0.1)
Basophils Relative: 1 %
Eosinophils Absolute: 0.2 10*3/uL (ref 0.0–0.5)
Eosinophils Relative: 2 %
HCT: 43.7 % (ref 39.0–52.0)
Hemoglobin: 14.8 g/dL (ref 13.0–17.0)
Immature Granulocytes: 0 %
Lymphocytes Relative: 31 %
Lymphs Abs: 2.8 10*3/uL (ref 0.7–4.0)
MCH: 33.9 pg (ref 26.0–34.0)
MCHC: 33.9 g/dL (ref 30.0–36.0)
MCV: 100.2 fL — ABNORMAL HIGH (ref 80.0–100.0)
Monocytes Absolute: 0.9 10*3/uL (ref 0.1–1.0)
Monocytes Relative: 9 %
Neutro Abs: 5.1 10*3/uL (ref 1.7–7.7)
Neutrophils Relative %: 57 %
Platelets: 217 10*3/uL (ref 150–400)
RBC: 4.36 MIL/uL (ref 4.22–5.81)
RDW: 12.3 % (ref 11.5–15.5)
WBC: 9 10*3/uL (ref 4.0–10.5)
nRBC: 0 % (ref 0.0–0.2)

## 2018-12-16 LAB — BASIC METABOLIC PANEL
Anion gap: 7 (ref 5–15)
BUN: 17 mg/dL (ref 6–20)
CO2: 26 mmol/L (ref 22–32)
Calcium: 8.7 mg/dL — ABNORMAL LOW (ref 8.9–10.3)
Chloride: 107 mmol/L (ref 98–111)
Creatinine, Ser: 1.07 mg/dL (ref 0.61–1.24)
GFR calc Af Amer: >60 mL/min (ref >60)
GFR calc non Af Amer: >60 mL/min (ref >60)
Glucose, Bld: 91 mg/dL (ref 70–99)
Potassium: 4.2 mmol/L (ref 3.5–5.1)
Sodium: 140 mmol/L (ref 135–145)

## 2018-12-16 MED ORDER — PROMETHAZINE HCL 25 MG/ML IJ SOLN
INTRAMUSCULAR | Status: AC
  Filled 2018-12-16: qty 1

## 2018-12-16 MED ORDER — IOPAMIDOL (ISOVUE-300) INJECTION 61%
INTRAVENOUS | Status: AC
  Filled 2018-12-16: qty 100

## 2018-12-16 MED ORDER — ONDANSETRON HCL 4 MG/2ML IJ SOLN
4.0000 mg | Freq: Once | INTRAMUSCULAR | Status: DC
  Filled 2018-12-16: qty 2

## 2018-12-16 MED ORDER — HYDROMORPHONE HCL 1 MG/ML IJ SOLN
1.0000 mg | Freq: Once | INTRAMUSCULAR | Status: AC
  Administered 2018-12-16: 1 mg via INTRAVENOUS
  Filled 2018-12-16: qty 1

## 2018-12-16 MED ORDER — PROMETHAZINE HCL 25 MG/ML IJ SOLN
12.5000 mg | Freq: Once | INTRAMUSCULAR | Status: AC
  Administered 2018-12-16: 12.5 mg via INTRAVENOUS

## 2018-12-16 MED ORDER — IOPAMIDOL (ISOVUE-300) INJECTION 61%
100.0000 mL | Freq: Once | INTRAVENOUS | Status: AC | PRN
  Administered 2018-12-16: 100 mL via INTRAVENOUS

## 2018-12-16 MED ORDER — SODIUM CHLORIDE (PF) 0.9 % IJ SOLN
INTRAMUSCULAR | Status: AC
  Filled 2018-12-16: qty 50

## 2018-12-16 MED ORDER — HYDROMORPHONE HCL 1 MG/ML IJ SOLN
0.5000 mg | Freq: Once | INTRAMUSCULAR | Status: AC
  Administered 2018-12-16: 0.5 mg via INTRAVENOUS
  Filled 2018-12-16: qty 1

## 2018-12-16 MED ORDER — SODIUM CHLORIDE 0.9 % IV SOLN
INTRAVENOUS | Status: DC
  Administered 2018-12-16: 16:00:00 via INTRAVENOUS

## 2018-12-16 NOTE — ED Notes (Signed)
Bed: XT02 Expected date:  Expected time:  Means of arrival:  Comments: Hip/shoulder deformity post fall-ketamine given
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
## 2018-12-16 NOTE — ED Triage Notes (Signed)
Per EMS: Pt complaint of fall. PT has obvious deformity to the right shoulder and right hip. Pulses weakened in right foot compared to left foot, pulses marked by EMS.  PT given of fentanyl and 16mg  of ketamine in route with no pain improvement.  Pt recently had surgery for a hernia approximately 9 weeks ago.

## 2019-06-04 ENCOUNTER — Encounter: Payer: Self-pay | Admitting: Medical Oncology

## 2019-06-04 ENCOUNTER — Emergency Department
Admission: EM | Admit: 2019-06-04 | Discharge: 2019-06-04 | Disposition: A | Discharge status: Home / Self Care | Payer: Medicare Other | Attending: Emergency Medicine | Admitting: Emergency Medicine

## 2019-06-04 ENCOUNTER — Other Ambulatory Visit: Payer: Self-pay

## 2019-06-04 ENCOUNTER — Emergency Department: Payer: Medicare Other

## 2019-06-04 DIAGNOSIS — F17210 Nicotine dependence, cigarettes, uncomplicated: Secondary | ICD-10-CM | POA: Insufficient documentation

## 2019-06-04 DIAGNOSIS — R05 Cough: Secondary | ICD-10-CM | POA: Insufficient documentation

## 2019-06-04 DIAGNOSIS — R0602 Shortness of breath: Secondary | ICD-10-CM | POA: Insufficient documentation

## 2019-06-04 DIAGNOSIS — M7918 Myalgia, other site: Secondary | ICD-10-CM | POA: Insufficient documentation

## 2019-06-04 DIAGNOSIS — Z20828 Contact with and (suspected) exposure to other viral communicable diseases: Secondary | ICD-10-CM | POA: Insufficient documentation

## 2019-06-04 DIAGNOSIS — R531 Weakness: Secondary | ICD-10-CM | POA: Diagnosis present

## 2019-06-04 DIAGNOSIS — Z79899 Other long term (current) drug therapy: Secondary | ICD-10-CM | POA: Insufficient documentation

## 2019-06-04 DIAGNOSIS — Z20822 Contact with and (suspected) exposure to covid-19: Secondary | ICD-10-CM

## 2019-06-04 LAB — BASIC METABOLIC PANEL
Anion gap: 5 (ref 5–15)
BUN: 15 mg/dL (ref 6–20)
CO2: 26 mmol/L (ref 22–32)
Calcium: 8.5 mg/dL — ABNORMAL LOW (ref 8.9–10.3)
Chloride: 110 mmol/L (ref 98–111)
Creatinine, Ser: 1.25 mg/dL — ABNORMAL HIGH (ref 0.61–1.24)
GFR calc Af Amer: >60 mL/min (ref >60)
GFR calc non Af Amer: >60 mL/min (ref >60)
Glucose, Bld: 105 mg/dL — ABNORMAL HIGH (ref 70–99)
Potassium: 4.1 mmol/L (ref 3.5–5.1)
Sodium: 141 mmol/L (ref 135–145)

## 2019-06-04 LAB — CBC WITH DIFFERENTIAL/PLATELET
Abs Immature Granulocytes: 0.03 10*3/uL (ref 0.00–0.07)
Basophils Absolute: 0.1 10*3/uL (ref 0.0–0.1)
Basophils Relative: 1 %
Eosinophils Absolute: 0.1 10*3/uL (ref 0.0–0.5)
Eosinophils Relative: 2 %
HCT: 44.5 % (ref 39.0–52.0)
Hemoglobin: 15.2 g/dL (ref 13.0–17.0)
Immature Granulocytes: 0 %
Lymphocytes Relative: 34 %
Lymphs Abs: 2.3 10*3/uL (ref 0.7–4.0)
MCH: 33.4 pg (ref 26.0–34.0)
MCHC: 34.2 g/dL (ref 30.0–36.0)
MCV: 97.8 fL (ref 80.0–100.0)
Monocytes Absolute: 0.6 10*3/uL (ref 0.1–1.0)
Monocytes Relative: 9 %
Neutro Abs: 3.6 10*3/uL (ref 1.7–7.7)
Neutrophils Relative %: 54 %
Platelets: 197 10*3/uL (ref 150–400)
RBC: 4.55 MIL/uL (ref 4.22–5.81)
RDW: 12.6 % (ref 11.5–15.5)
WBC: 6.7 10*3/uL (ref 4.0–10.5)
nRBC: 0 % (ref 0.0–0.2)

## 2019-06-04 MED ORDER — ACETAMINOPHEN 325 MG PO TABS
650.0000 mg | ORAL_TABLET | Freq: Once | ORAL | Status: AC
  Administered 2019-06-04: 650 mg via ORAL
  Filled 2019-06-04: qty 2

## 2019-06-04 MED ORDER — METOCLOPRAMIDE HCL 10 MG PO TABS
10.0000 mg | ORAL_TABLET | Freq: Once | ORAL | Status: AC
  Administered 2019-06-04: 10 mg via ORAL
  Filled 2019-06-04: qty 1
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
## 2019-06-04 NOTE — Discharge Instructions (Addendum)
You are being tested for suspected COVID-19 infection. Remain at home until you receive a call in 3-4 days regarding positive results. You Roediger also check on MyChart to confirm negative results. Remain at home until results are verified. You should quarantine from your household contacts as much as possible. Wear a mask and wash your hands and surfaces regularly. Take your home meds and continue to hydrated to prevent dehydration. Return to the ED for any worsening symptoms including shortness of breath, chest pain, or severe nausea and vomiting.

## 2019-06-04 NOTE — ED Notes (Signed)
Provider at bedside

## 2019-06-04 NOTE — ED Triage Notes (Addendum)
Pt reports that he has been having sob, cough, weakness, nausea and body aches since yesterday. Pt reports no known contact with covid-19. Pt has no obvious distress- O2 98% on RA

## 2019-06-04 NOTE — ED Notes (Signed)
See triage note  Presents with weakness and  some nausea    States he just doesn't feel well  No vomiting or diarrhea or fever

## 2019-06-05 LAB — NOVEL CORONAVIRUS, NAA (HOSP ORDER, SEND-OUT TO REF LAB; TAT 18-24 HRS): SARS-CoV-2, NAA: NOT DETECTED

## 2019-09-13 ENCOUNTER — Other Ambulatory Visit: Payer: Self-pay | Admitting: Surgery

## 2019-10-15 ENCOUNTER — Other Ambulatory Visit: Payer: Self-pay | Admitting: Surgery

## 2019-11-22 ENCOUNTER — Other Ambulatory Visit: Payer: Self-pay | Admitting: Surgery

## 2020-01-17 ENCOUNTER — Other Ambulatory Visit: Payer: Self-pay

## 2020-01-17 ENCOUNTER — Encounter (HOSPITAL_BASED_OUTPATIENT_CLINIC_OR_DEPARTMENT_OTHER): Payer: Self-pay | Admitting: Surgery

## 2020-01-20 ENCOUNTER — Other Ambulatory Visit (HOSPITAL_COMMUNITY)
Admission: RE | Admit: 2020-01-20 | Discharge: 2020-01-20 | Disposition: A | Discharge status: Home / Self Care | Payer: Medicare Other | Source: Ambulatory Visit | Attending: Surgery | Admitting: Surgery

## 2020-01-20 DIAGNOSIS — F17210 Nicotine dependence, cigarettes, uncomplicated: Secondary | ICD-10-CM | POA: Diagnosis not present

## 2020-01-20 DIAGNOSIS — Z79899 Other long term (current) drug therapy: Secondary | ICD-10-CM | POA: Diagnosis not present

## 2020-01-20 DIAGNOSIS — R1031 Right lower quadrant pain: Secondary | ICD-10-CM | POA: Diagnosis not present

## 2020-01-20 DIAGNOSIS — G8929 Other chronic pain: Secondary | ICD-10-CM | POA: Diagnosis present

## 2020-01-20 DIAGNOSIS — Z791 Long term (current) use of non-steroidal anti-inflammatories (NSAID): Secondary | ICD-10-CM | POA: Diagnosis not present

## 2020-01-20 DIAGNOSIS — Z01812 Encounter for preprocedural laboratory examination: Secondary | ICD-10-CM | POA: Insufficient documentation

## 2020-01-20 DIAGNOSIS — Z20822 Contact with and (suspected) exposure to covid-19: Secondary | ICD-10-CM | POA: Diagnosis not present

## 2020-01-20 LAB — SARS CORONAVIRUS 2 (TAT 6-24 HRS): SARS Coronavirus 2: NEGATIVE

## 2020-01-20 NOTE — Progress Notes (Signed)
      Enhanced Recovery after Surgery for Orthopedics Enhanced Recovery after Surgery is a protocol used to improve the stress on your body and your recovery after surgery.  Patient Instructions  . The night before surgery:  o No food after midnight. ONLY clear liquids after midnight  . The day of surgery (if you do NOT have diabetes):  o Drink ONE (1) Pre-Surgery Clear Ensure as directed.   o This drink was given to you during your hospital  pre-op appointment visit. o The pre-op nurse will instruct you on the time to drink the  Pre-Surgery Ensure depending on your surgery time. o Finish the drink at the designated time by the pre-op nurse.  o Nothing else to drink after completing the  Pre-Surgery Clear Ensure.  . The day of surgery (if you have diabetes): o Drink ONE (1) Gatorade 2 (G2) as directed. o This drink was given to you during your hospital  pre-op appointment visit.  o The pre-op nurse will instruct you on the time to drink the   Gatorade 2 (G2) depending on your surgery time. o Color of the Gatorade [place] vary. Red is not allowed. o Nothing else to drink after completing the  Gatorade 2 (G2).         If you have questions, please contact your surgeon's office. Surgical soap given with instructions, pt verbalized understanding.  

## 2020-01-22 NOTE — H&P (Signed)
   [name]  Location: [facility] Patient #: [id number] DOB: [birth year] Married / Language: English / Race: White Male   History of Present Illness   The patient is a 59 year old male who presents with abdominal pain. He continues to have right groin pain and swelling. Most of his discomfort is from the permanent suture material whichshe feels like is poking into the overlying skin. It hurts significantly with motion and when things fit up against the old incision.   Allergies  Ketorolac *ANALGESICS - ANTI-INFLAMMATORY*  Diarrhea. Codeine/Codeine Derivatives  Dizziness. Allergies Reconciled   Medication History  oxyCODONE-Acetaminophen (5-325MG  Tablet, 1 (one) Oral every six hours, as needed, Taken starting 10/01/2019) Active. Gabapentin (300MG  Capsule, 3 (three) Oral three times daily, Taken starting 08/16/2019) Active. Promethazine HCl (25MG  Tablet, 1 (one) Oral every six hours, as needed for nausea, Taken starting 08/16/2019) Active. Neurontin (300MG  Capsule, Oral as directed, Taken starting 06/18/2019) Active. (300 mg twice daily and 600 mg at bedtime) Voltaren (1% Gel, 1 (one) Transdermal twice daily, Taken starting 11/23/2018) Active.  Vitals   Weight: 191.6 lb Height: 72in Body Surface Area: 2.09 m Body Mass Index: 25.99 kg/m  Temp.: 97.85F  Pulse: 99 (Regular)  BP: 112/72 (Sitting, Left Arm, Standard)    Physical Exam   The physical exam findings are as follows: Note:There still remains no evidence of recurrent hernia. He has palpable Prolene sutures under the incision which are tender. There is no evidence of stitch abscess. Lungs clear CV RRR Skin without erythema    Assessment & Plan   CHRONIC GROIN PAIN, RIGHT (R10.31)  Impression: At this point, he more than 1 year out from removal of his old mesh. He continues to have discomfort from the suture material. I was unable to remove any significantly in the office. This  would require anesthesia in the operating room. He would like to proceed with removal of the suture. We discussed the minimal risks of bleeding and infection as well as continued chronic discomfort. He understands and wishes to proceed with surgery.

## 2020-01-23 ENCOUNTER — Ambulatory Visit (HOSPITAL_BASED_OUTPATIENT_CLINIC_OR_DEPARTMENT_OTHER): Payer: Medicare Other | Admitting: Certified Registered Nurse Anesthetist

## 2020-01-23 ENCOUNTER — Ambulatory Visit (HOSPITAL_BASED_OUTPATIENT_CLINIC_OR_DEPARTMENT_OTHER)
Admission: RE | Admit: 2020-01-23 | Discharge: 2020-01-23 | Disposition: A | Discharge status: Home / Self Care | Payer: Medicare Other | Attending: Surgery | Admitting: Surgery

## 2020-01-23 ENCOUNTER — Encounter (HOSPITAL_BASED_OUTPATIENT_CLINIC_OR_DEPARTMENT_OTHER): Payer: Self-pay | Admitting: Surgery

## 2020-01-23 ENCOUNTER — Encounter (HOSPITAL_BASED_OUTPATIENT_CLINIC_OR_DEPARTMENT_OTHER)
Admission: RE | Disposition: A | Discharge status: Home / Self Care | Payer: Self-pay | Source: Home / Self Care | Attending: Surgery

## 2020-01-23 ENCOUNTER — Other Ambulatory Visit: Payer: Self-pay

## 2020-01-23 DIAGNOSIS — G8929 Other chronic pain: Secondary | ICD-10-CM | POA: Insufficient documentation

## 2020-01-23 DIAGNOSIS — Z791 Long term (current) use of non-steroidal anti-inflammatories (NSAID): Secondary | ICD-10-CM | POA: Insufficient documentation

## 2020-01-23 DIAGNOSIS — Z79899 Other long term (current) drug therapy: Secondary | ICD-10-CM | POA: Insufficient documentation

## 2020-01-23 DIAGNOSIS — F17210 Nicotine dependence, cigarettes, uncomplicated: Secondary | ICD-10-CM | POA: Insufficient documentation

## 2020-01-23 DIAGNOSIS — Z20822 Contact with and (suspected) exposure to covid-19: Secondary | ICD-10-CM | POA: Insufficient documentation

## 2020-01-23 DIAGNOSIS — R1031 Right lower quadrant pain: Secondary | ICD-10-CM | POA: Insufficient documentation

## 2020-01-23 HISTORY — PX: GROIN DISSECTION: SHX5250

## 2020-01-23 HISTORY — DX: Other chronic pain: G89.29

## 2020-01-23 HISTORY — DX: Right lower quadrant pain: R10.31

## 2020-01-23 SURGERY — EXPLORATION, INGUINAL REGION
Anesthesia: General | Site: Groin | Laterality: Right

## 2020-01-23 MED ORDER — CEFAZOLIN SODIUM-DEXTROSE 2-4 GM/100ML-% IV SOLN
INTRAVENOUS | Status: AC
  Filled 2020-01-23: qty 100

## 2020-01-23 MED ORDER — BUPIVACAINE-EPINEPHRINE (PF) 0.5% -1:200000 IJ SOLN
INTRAMUSCULAR | Status: DC | PRN
  Administered 2020-01-23: 30 mL

## 2020-01-23 MED ORDER — ACETAMINOPHEN 10 MG/ML IV SOLN
1000.0000 mg | Freq: Once | INTRAVENOUS | Status: DC | PRN
  Administered 2020-01-23: 1000 mg via INTRAVENOUS

## 2020-01-23 MED ORDER — PHENYLEPHRINE 40 MCG/ML (10ML) SYRINGE FOR IV PUSH (FOR BLOOD PRESSURE SUPPORT)
PREFILLED_SYRINGE | INTRAVENOUS | Status: AC
  Filled 2020-01-23: qty 10

## 2020-01-23 MED ORDER — ACETAMINOPHEN 325 MG PO TABS: 325.0000 mg | ORAL_TABLET | Freq: Once | ORAL | Status: DC | PRN

## 2020-01-23 MED ORDER — OXYCODONE HCL 5 MG PO TABS
ORAL_TABLET | ORAL | Status: AC
  Filled 2020-01-23: qty 1

## 2020-01-23 MED ORDER — OXYCODONE HCL 5 MG PO TABS
5.0000 mg | ORAL_TABLET | Freq: Once | ORAL | Status: AC | PRN
  Administered 2020-01-23: 5 mg via ORAL

## 2020-01-23 MED ORDER — BUPIVACAINE LIPOSOME 1.3 % IJ SUSP: 20.0000 mL | Freq: Once | INTRAMUSCULAR | Status: DC

## 2020-01-23 MED ORDER — MEPERIDINE HCL 25 MG/ML IJ SOLN: 6.2500 mg | INTRAMUSCULAR | Status: DC | PRN

## 2020-01-23 MED ORDER — CHLORHEXIDINE GLUCONATE CLOTH 2 % EX PADS: 6.0000 | MEDICATED_PAD | Freq: Once | CUTANEOUS | Status: DC

## 2020-01-23 MED ORDER — DEXAMETHASONE SODIUM PHOSPHATE 10 MG/ML IJ SOLN
INTRAMUSCULAR | Status: AC
  Filled 2020-01-23: qty 1

## 2020-01-23 MED ORDER — GABAPENTIN 300 MG PO CAPS
300.0000 mg | ORAL_CAPSULE | ORAL | Status: AC
  Administered 2020-01-23: 07:00:00 300 mg via ORAL

## 2020-01-23 MED ORDER — DEXAMETHASONE SODIUM PHOSPHATE 10 MG/ML IJ SOLN
INTRAMUSCULAR | Status: DC | PRN
  Administered 2020-01-23: 8 mg via INTRAVENOUS

## 2020-01-23 MED ORDER — PROPOFOL 500 MG/50ML IV EMUL
INTRAVENOUS | Status: AC
  Filled 2020-01-23: qty 50

## 2020-01-23 MED ORDER — LACTATED RINGERS IV SOLN: INTRAVENOUS | Status: DC

## 2020-01-23 MED ORDER — OXYCODONE HCL 5 MG/5ML PO SOLN: 5.0000 mg | Freq: Once | ORAL | Status: AC | PRN

## 2020-01-23 MED ORDER — BUPIVACAINE HCL (PF) 0.5 % IJ SOLN
INTRAMUSCULAR | Status: DC | PRN
  Administered 2020-01-23: 20 mL

## 2020-01-23 MED ORDER — ONDANSETRON HCL 4 MG/2ML IJ SOLN
INTRAMUSCULAR | Status: DC | PRN
  Administered 2020-01-23: 4 mg via INTRAVENOUS

## 2020-01-23 MED ORDER — PROPOFOL 500 MG/50ML IV EMUL
INTRAVENOUS | Status: DC | PRN
  Administered 2020-01-23: 25 ug/kg/min via INTRAVENOUS

## 2020-01-23 MED ORDER — PROPOFOL 10 MG/ML IV BOLUS
INTRAVENOUS | Status: DC | PRN
  Administered 2020-01-23: 160 mg via INTRAVENOUS

## 2020-01-23 MED ORDER — MIDAZOLAM HCL 2 MG/2ML IJ SOLN
1.0000 mg | INTRAMUSCULAR | Status: DC | PRN
  Administered 2020-01-23: 07:00:00 2 mg via INTRAVENOUS

## 2020-01-23 MED ORDER — PROMETHAZINE HCL 25 MG/ML IJ SOLN
INTRAMUSCULAR | Status: AC
  Filled 2020-01-23: qty 1

## 2020-01-23 MED ORDER — FENTANYL CITRATE (PF) 100 MCG/2ML IJ SOLN
INTRAMUSCULAR | Status: AC
  Filled 2020-01-23: qty 2

## 2020-01-23 MED ORDER — LIDOCAINE 2% (20 MG/ML) 5 ML SYRINGE
INTRAMUSCULAR | Status: AC
  Filled 2020-01-23: qty 5

## 2020-01-23 MED ORDER — ACETAMINOPHEN 10 MG/ML IV SOLN
INTRAVENOUS | Status: AC
  Filled 2020-01-23: qty 100

## 2020-01-23 MED ORDER — FENTANYL CITRATE (PF) 100 MCG/2ML IJ SOLN
50.0000 ug | INTRAMUSCULAR | Status: DC | PRN
  Administered 2020-01-23: 100 ug via INTRAVENOUS

## 2020-01-23 MED ORDER — MIDAZOLAM HCL 2 MG/2ML IJ SOLN
INTRAMUSCULAR | Status: AC
  Filled 2020-01-23: qty 2

## 2020-01-23 MED ORDER — LIDOCAINE HCL (CARDIAC) PF 100 MG/5ML IV SOSY
PREFILLED_SYRINGE | INTRAVENOUS | Status: DC | PRN
  Administered 2020-01-23: 60 mg via INTRAVENOUS

## 2020-01-23 MED ORDER — ACETAMINOPHEN 160 MG/5ML PO SOLN: 325.0000 mg | Freq: Once | ORAL | Status: DC | PRN

## 2020-01-23 MED ORDER — PHENYLEPHRINE 40 MCG/ML (10ML) SYRINGE FOR IV PUSH (FOR BLOOD PRESSURE SUPPORT)
PREFILLED_SYRINGE | INTRAVENOUS | Status: DC | PRN
  Administered 2020-01-23 (×3): 80 ug via INTRAVENOUS

## 2020-01-23 MED ORDER — CEFAZOLIN SODIUM-DEXTROSE 2-4 GM/100ML-% IV SOLN
2.0000 g | INTRAVENOUS | Status: AC
  Administered 2020-01-23: 2 g via INTRAVENOUS

## 2020-01-23 MED ORDER — HYDROMORPHONE HCL 1 MG/ML IJ SOLN: 0.2500 mg | INTRAMUSCULAR | Status: DC | PRN

## 2020-01-23 MED ORDER — PROMETHAZINE HCL 25 MG/ML IJ SOLN
6.2500 mg | INTRAMUSCULAR | Status: AC | PRN
  Administered 2020-01-23 (×2): 6.25 mg via INTRAVENOUS

## 2020-01-23 MED ORDER — ONDANSETRON HCL 4 MG/2ML IJ SOLN
INTRAMUSCULAR | Status: AC
  Filled 2020-01-23: qty 2

## 2020-01-23 MED ORDER — GABAPENTIN 300 MG PO CAPS
ORAL_CAPSULE | ORAL | Status: AC
  Filled 2020-01-23: qty 1

## 2020-01-23 MED ORDER — OXYCODONE-ACETAMINOPHEN 5-325 MG PO TABS: 1.0000 | ORAL_TABLET | Freq: Four times a day (QID) | ORAL | 0 refills | Status: DC | PRN

## 2020-01-23 SURGICAL SUPPLY — 48 items
ADH SKN CLS APL DERMABOND .7 (GAUZE/BANDAGES/DRESSINGS) ×1
APL PRP STRL LF DISP 70% ISPRP (MISCELLANEOUS) ×1
BLADE CLIPPER SURG (BLADE) ×3 IMPLANT
BLADE SURG 10 STRL SS (BLADE) ×3 IMPLANT
CHLORAPREP W/TINT 26 (MISCELLANEOUS) ×3 IMPLANT
COVER BACK TABLE 60X90IN (DRAPES) ×3 IMPLANT
COVER MAYO STAND STRL (DRAPES) ×3 IMPLANT
COVER WAND RF STERILE (DRAPES) IMPLANT
DECANTER SPIKE VIAL GLASS SM (MISCELLANEOUS) ×1 IMPLANT
DERMABOND ADVANCED (GAUZE/BANDAGES/DRESSINGS) ×2
DERMABOND ADVANCED .7 DNX12 (GAUZE/BANDAGES/DRESSINGS) ×2 IMPLANT
DRAIN PENROSE 1/2X12 LTX STRL (WOUND CARE) ×1 IMPLANT
DRAPE LAPAROTOMY 100X72 PEDS (DRAPES) ×3 IMPLANT
DRAPE UTILITY XL STRL (DRAPES) ×3 IMPLANT
ELECT REM PT RETURN 9FT ADLT (ELECTROSURGICAL) ×3
ELECTRODE REM PT RTRN 9FT ADLT (ELECTROSURGICAL) ×1 IMPLANT
GLOVE BIO SURGEON STRL SZ 6.5 (GLOVE) ×2 IMPLANT
GLOVE BIO SURGEONS STRL SZ 6.5 (GLOVE) ×2
GLOVE BIOGEL PI IND STRL 6.5 (GLOVE) IMPLANT
GLOVE BIOGEL PI IND STRL 7.0 (GLOVE) IMPLANT
GLOVE BIOGEL PI INDICATOR 6.5 (GLOVE) ×2
GLOVE BIOGEL PI INDICATOR 7.0 (GLOVE) ×4
GLOVE SURG SIGNA 7.5 PF LTX (GLOVE) ×3 IMPLANT
GOWN STRL REUS W/ TWL LRG LVL3 (GOWN DISPOSABLE) ×1 IMPLANT
GOWN STRL REUS W/ TWL XL LVL3 (GOWN DISPOSABLE) ×1 IMPLANT
GOWN STRL REUS W/TWL LRG LVL3 (GOWN DISPOSABLE) ×6
GOWN STRL REUS W/TWL XL LVL3 (GOWN DISPOSABLE) ×3
NDL HYPO 25X1 1.5 SAFETY (NEEDLE) ×1 IMPLANT
NEEDLE HYPO 22GX1.5 SAFETY (NEEDLE) ×3 IMPLANT
NEEDLE HYPO 25X1 1.5 SAFETY (NEEDLE) IMPLANT
NS IRRIG 1000ML POUR BTL (IV SOLUTION) ×2 IMPLANT
PACK BASIN DAY SURGERY FS (CUSTOM PROCEDURE TRAY) ×3 IMPLANT
PENCIL SMOKE EVACUATOR (MISCELLANEOUS) ×3 IMPLANT
SLEEVE SCD COMPRESS KNEE MED (MISCELLANEOUS) ×2 IMPLANT
SPONGE INTESTINAL PEANUT (DISPOSABLE) IMPLANT
SPONGE LAP 4X18 RFD (DISPOSABLE) ×3 IMPLANT
SUT MNCRL AB 4-0 PS2 18 (SUTURE) ×3 IMPLANT
SUT SILK 2 0 SH (SUTURE) IMPLANT
SUT VIC AB 2-0 CT1 27 (SUTURE)
SUT VIC AB 2-0 CT1 TAPERPNT 27 (SUTURE) ×1 IMPLANT
SUT VIC AB 3-0 CT1 27 (SUTURE)
SUT VIC AB 3-0 CT1 27XBRD (SUTURE) ×1 IMPLANT
SUT VIC AB 3-0 SH 27 (SUTURE) ×3
SUT VIC AB 3-0 SH 27X BRD (SUTURE) IMPLANT
SUT VICRYL AB 3 0 TIES (SUTURE) ×1 IMPLANT
SYR BULB 3OZ (MISCELLANEOUS) IMPLANT
SYR CONTROL 10ML LL (SYRINGE) ×3 IMPLANT
TOWEL GREEN STERILE FF (TOWEL DISPOSABLE) ×4 IMPLANT

## 2020-01-23 NOTE — Anesthesia Postprocedure Evaluation (Signed)
Anesthesia Post Note  Patient: Zephan Beauchaine Gerlich  Procedure(s) Performed: RIGHT GROIN EXPLORATION, REMOVAL SUTURES (Right Groin)     Patient location during evaluation: PACU Anesthesia Type: General Level of consciousness: awake and alert Pain management: pain level controlled Vital Signs Assessment: post-procedure vital signs reviewed and stable Respiratory status: spontaneous breathing, nonlabored ventilation, respiratory function stable and patient connected to nasal cannula oxygen Cardiovascular status: blood pressure returned to baseline and stable Postop Assessment: no apparent nausea or vomiting Anesthetic complications: no    Last Vitals:  Vitals:   01/23/20 0830 01/23/20 0900  BP: 130/89 (!) 142/6  Pulse: 73 78  Resp: 14 20  Temp:  36.6 C  SpO2: 99% 98%    Last Pain:  Vitals:   01/23/20 0900  TempSrc:   PainSc: 5                  Shelton Silvas

## 2020-01-23 NOTE — Interval H&P Note (Signed)
History and Physical Interval Note:no change in H and P  01/23/2020 7:09 AM  [name]  has presented today for surgery, with the diagnosis of chronic right groin pain.  The various methods of treatment have been discussed with the patient and family. After consideration of risks, benefits and other options for treatment, the patient has consented to  Procedure(s) with comments: RIGHT GROIN EXPLORATION, REMOVAL SUTURES (Right) - MAC AND TAP BLOCK as a surgical intervention.  The patient's history has been reviewed, patient examined, no change in status, stable for surgery.  I have reviewed the patient's chart and labs.  Questions were answered to the patient's satisfaction.     Coralie Keens

## 2020-01-23 NOTE — Anesthesia Procedure Notes (Signed)
Procedure Name: LMA Insertion Date/Time: 01/23/2020 7:29 AM Performed by: Yolonda Kida, CRNA Pre-anesthesia Checklist: Patient identified, Emergency Drugs available, Suction available and Patient being monitored Patient Re-evaluated:Patient Re-evaluated prior to induction Oxygen Delivery Method: Circle system utilized Preoxygenation: Pre-oxygenation with 100% oxygen Induction Type: IV induction LMA: LMA inserted LMA Size: 4.0 Number of attempts: 1 Placement Confirmation: positive ETCO2 and breath sounds checked- equal and bilateral Tube secured with: Tape Dental Injury: Teeth and Oropharynx as per pre-operative assessment

## 2020-01-23 NOTE — Anesthesia Procedure Notes (Signed)
Anesthesia Regional Block: TAP block   Pre-Anesthetic Checklist: ,, timeout performed, Correct Patient, Correct Site, Correct Laterality, Correct Procedure, Correct Position, site marked, Risks and benefits discussed,  Surgical consent,  Pre-op evaluation,  At surgeon's request and post-op pain management  Laterality: Right  Prep: chloraprep       Needles:  Injection technique: Single-shot  Needle Type: Echogenic Stimulator Needle     Needle Length: 9cm  Needle Gauge: 21     Additional Needles:   Procedures:,,,, ultrasound used (permanent image in chart),,,,  Narrative:  Start time: 01/23/2020 7:10 AM End time: 01/23/2020 7:15 AM Injection made incrementally with aspirations every 5 mL.  Performed by: Personally  Anesthesiologist: Shelton Silvas, MD  Additional Notes: Patient tolerated the procedure well. Local anesthetic introduced in an incremental fashion under minimal resistance after negative aspirations. No paresthesias were elicited. After completion of the procedure, no acute issues were identified and patient continued to be monitored by RN.

## 2020-01-23 NOTE — Anesthesia Preprocedure Evaluation (Addendum)
Anesthesia Evaluation  Patient identified by MRN, date of birth, ID band Patient awake    Reviewed: Allergy & Precautions, NPO status , Patient's Chart, lab work & pertinent test results  History of Anesthesia Complications (+) PONV and history of anesthetic complications  Airway Mallampati: III  TM Distance: >3 FB Neck ROM: Limited  Mouth opening: Limited Mouth Opening  Dental  (+) Teeth Intact, Dental Advisory Given   Pulmonary Current Smoker,    breath sounds clear to auscultation       Cardiovascular negative cardio ROS   Rhythm:Regular Rate:Normal     Neuro/Psych  Headaches, negative psych ROS   GI/Hepatic negative GI ROS, Neg liver ROS,   Endo/Other  negative endocrine ROS  Renal/GU      Musculoskeletal negative musculoskeletal ROS (+)   Abdominal (+)  Abdomen: tender.    Peds  Hematology negative hematology ROS (+)   Anesthesia Other Findings   Reproductive/Obstetrics                           Anesthesia Physical Anesthesia Plan  ASA: II  Anesthesia Plan: General   Post-op Pain Management: GA combined w/ Regional for post-op pain   Induction: Intravenous  PONV Risk Score and Plan: 3 and Ondansetron, Propofol infusion and Midazolam  Airway Management Planned: LMA  Additional Equipment: None  Intra-op Plan:   Post-operative Plan: Extubation in OR  Informed Consent: I have reviewed the patients History and Physical, chart, labs and discussed the procedure including the risks, benefits and alternatives for the proposed anesthesia with the patient or authorized representative who has indicated his/her understanding and acceptance.       Plan Discussed with: CRNA  Anesthesia Plan Comments:        Anesthesia Quick Evaluation

## 2020-01-23 NOTE — Progress Notes (Signed)
Assisted Dr. Hollis with right, ultrasound guided, transabdominal plane block. Side rails up, monitors on throughout procedure. See vital signs in flow sheet. Tolerated Procedure well.  

## 2020-01-23 NOTE — Op Note (Signed)
RIGHT GROIN EXPLORATION, REMOVAL SUTURES  Procedure Note  [name] 01/23/2020   Pre-op Diagnosis: chronic right groin pain     Post-op Diagnosis: same  Procedure(s): RIGHT GROIN EXPLORATION, REMOVAL SUTURES  Surgeon(s): Abigail Miyamoto, MD  Anesthesia: General  Staff:  Circulator: Lenn Cal, RN Scrub Person: Keturah Barre, CST  Estimated Blood Loss: Minimal               Indications: This is a 59 year old gentleman with chronic groin pain.  This is on the right side and is been occurring for many years.  He had undergone a right groin exploration with removal of his prior placed inguinal mesh and repair of the inguinal floor with permanent sutures as well as orchiectomy over a year ago.  He has been having increasing pain and has palpable sutures underneath the incision.  The decision was made to proceed with groin exploration with possible suture removal in the hopes of relieving his discomfort.  Findings: In the area of concern, there were 2 permanent sutures which I completely removed.  There was no evidence of recurrent hernia and I did not see any retained mesh.  Procedure: The patient was identified in the preoperative holding area.  He underwent a tap block by the anesthesiologist on the right side.  He was then taken to the operating room.  He was placed upon the operating table general anesthesia was induced.  His right inguinal area was then prepped and draped in usual sterile fashion.  I anesthetized the skin at the old incision in the right inguinal area with Marcaine.  I then made an incision with a scalpel.  I took this down through Scarpa's fascia and scar tissue with electrocautery.  I then identified to permanent sutures laterally in the inguinal area at the area of his tenderness and excised these with the scissors.  I could not identify any hernias or any other abnormalities.  I did not palpate any further mesh or sutures.  I anesthetized the incision  further with Marcaine.  Hemostasis was achieved.  I then closed the subcutaneous tissue with interrupted 3-0 Vicryl sutures and closed the skin with a running 4-0 Monocryl.  Dermabond was then applied.  The patient tolerated the procedure well.  All the counts were correct at the end of the procedure.  The patient was then extubated in the operating room and taken in a stable condition to the recovery room.          Abigail Miyamoto   Date: 01/23/2020  Time: 7:52 AM

## 2020-01-23 NOTE — Transfer of Care (Signed)
Immediate Anesthesia Transfer of Care Note  Patient: [name]  Procedure(s) Performed: RIGHT GROIN EXPLORATION, REMOVAL SUTURES (Right Groin)  Patient Location: PACU  Anesthesia Type:GA combined with regional for post-op pain  Level of Consciousness: drowsy and patient cooperative  Airway & Oxygen Therapy: Patient Spontanous Breathing and Patient connected to face mask oxygen  Post-op Assessment: Report given to RN and Post -op Vital signs reviewed and stable  Post vital signs: Reviewed and stable  Last Vitals:  Vitals Value Taken Time  BP 107/80 01/23/20 0800  Temp    Pulse 72 01/23/20 0800  Resp 13 01/23/20 0800  SpO2 99 % 01/23/20 0800    Last Pain:  Vitals:   01/23/20 0646  TempSrc: Tympanic  PainSc: 6       Patients Stated Pain Goal: 4 (01/23/20 0646)  Complications: No apparent anesthesia complications

## 2020-01-23 NOTE — Discharge Instructions (Signed)
It is okay to shower starting tomorrow  No vigorous activity for 1 week  Ice pack, Tylenol, and ibuprofen also for pain  NEXT DOSE OF TYLENOL CAN BE TAKEN AT 130PM TODAY    Post Anesthesia Home Care Instructions  Activity: Get plenty of rest for the remainder of the day. A responsible individual must stay with you for 24 hours following the procedure.  For the next 24 hours, DO NOT: -Drive a car -Advertising copywriter -Drink alcoholic beverages -Take any medication unless instructed by your physician -Make any legal decisions or sign important papers.  Meals: Start with liquid foods such as gelatin or soup. Progress to regular foods as tolerated. Avoid greasy, spicy, heavy foods. If nausea and/or vomiting occur, drink only clear liquids until the nausea and/or vomiting subsides. Call your physician if vomiting continues.  Special Instructions/Symptoms: Your throat Selph feel dry or sore from the anesthesia or the breathing tube placed in your throat during surgery. If this causes discomfort, gargle with warm salt water. The discomfort should disappear within 24 hours.  If you had a scopolamine patch placed behind your ear for the management of post- operative nausea and/or vomiting:  1. The medication in the patch is effective for 72 hours, after which it should be removed.  Wrap patch in a tissue and discard in the trash. Wash hands thoroughly with soap and water. 2. You Markos remove the patch earlier than 72 hours if you experience unpleasant side effects which Celmer include dry mouth, dizziness or visual disturbances. 3. Avoid touching the patch. Wash your hands with soap and water after contact with the patch.    Regional Anesthesia Blocks  1. Numbness or the inability to move the "blocked" extremity Fells last from 3-48 hours after placement. The length of time depends on the medication injected and your individual response to the medication. If the numbness is not going away after 48  hours, call your surgeon.  2. The extremity that is blocked will need to be protected until the numbness is gone and the  Strength has returned. Because you cannot feel it, you will need to take extra care to avoid injury. Because it [place] be weak, you Fly have difficulty moving it or using it. You Mikulski not know what position it is in without looking at it while the block is in effect.  3. For blocks in the legs and feet, returning to weight bearing and walking needs to be done carefully. You will need to wait until the numbness is entirely gone and the strength has returned. You should be able to move your leg and foot normally before you try and bear weight or walk. You will need someone to be with you when you first try to ensure you do not fall and possibly risk injury.  4. Bruising and tenderness at the needle site are common side effects and will resolve in a few days.  5. Persistent numbness or new problems with movement should be communicated to the surgeon or the [facility] [phone] [facility] [phone]).

## 2020-01-24 ENCOUNTER — Encounter: Payer: Self-pay | Admitting: *Deleted

## 2020-03-23 ENCOUNTER — Emergency Department: Payer: Medicare Other

## 2020-03-23 ENCOUNTER — Other Ambulatory Visit: Payer: Self-pay

## 2020-03-23 ENCOUNTER — Encounter: Payer: Self-pay | Admitting: Emergency Medicine

## 2020-03-23 ENCOUNTER — Emergency Department
Admission: EM | Admit: 2020-03-23 | Discharge: 2020-03-23 | Disposition: A | Discharge status: Home / Self Care | Payer: Medicare Other | Attending: Emergency Medicine | Admitting: Emergency Medicine

## 2020-03-23 DIAGNOSIS — S0990XA Unspecified injury of head, initial encounter: Secondary | ICD-10-CM | POA: Diagnosis not present

## 2020-03-23 DIAGNOSIS — S161XXA Strain of muscle, fascia and tendon at neck level, initial encounter: Secondary | ICD-10-CM | POA: Insufficient documentation

## 2020-03-23 DIAGNOSIS — W07XXXA Fall from chair, initial encounter: Secondary | ICD-10-CM | POA: Diagnosis not present

## 2020-03-23 DIAGNOSIS — Y9389 Activity, other specified: Secondary | ICD-10-CM | POA: Diagnosis not present

## 2020-03-23 DIAGNOSIS — Z79899 Other long term (current) drug therapy: Secondary | ICD-10-CM | POA: Diagnosis not present

## 2020-03-23 DIAGNOSIS — Y998 Other external cause status: Secondary | ICD-10-CM | POA: Diagnosis not present

## 2020-03-23 DIAGNOSIS — S29019A Strain of muscle and tendon of unspecified wall of thorax, initial encounter: Secondary | ICD-10-CM | POA: Insufficient documentation

## 2020-03-23 DIAGNOSIS — Y92018 Other place in single-family (private) house as the place of occurrence of the external cause: Secondary | ICD-10-CM | POA: Insufficient documentation

## 2020-03-23 DIAGNOSIS — F17210 Nicotine dependence, cigarettes, uncomplicated: Secondary | ICD-10-CM | POA: Insufficient documentation

## 2020-03-23 MED ORDER — FENTANYL CITRATE (PF) 100 MCG/2ML IJ SOLN
100.0000 ug | Freq: Once | INTRAMUSCULAR | Status: AC
  Administered 2020-03-23: 100 ug via INTRAVENOUS
  Filled 2020-03-23: qty 2

## 2020-03-23 MED ORDER — MORPHINE SULFATE (PF) 4 MG/ML IV SOLN
4.0000 mg | Freq: Once | INTRAVENOUS | Status: AC
  Administered 2020-03-23: 4 mg via INTRAVENOUS
  Filled 2020-03-23: qty 1

## 2020-03-23 MED ORDER — OXYCODONE-ACETAMINOPHEN 5-325 MG PO TABS: 1.0000 | ORAL_TABLET | Freq: Four times a day (QID) | ORAL | 0 refills | Status: AC | PRN

## 2020-03-23 MED ORDER — ORPHENADRINE CITRATE 30 MG/ML IJ SOLN
60.0000 mg | Freq: Once | INTRAMUSCULAR | Status: AC
  Administered 2020-03-23: 60 mg via INTRAVENOUS
  Filled 2020-03-23: qty 2

## 2020-03-23 MED ORDER — ONDANSETRON HCL 4 MG/2ML IJ SOLN
4.0000 mg | Freq: Once | INTRAMUSCULAR | Status: AC
  Administered 2020-03-23: 4 mg via INTRAVENOUS
  Filled 2020-03-23: qty 2

## 2020-03-23 MED ORDER — TIZANIDINE HCL 4 MG PO TABS: 4.0000 mg | ORAL_TABLET | Freq: Three times a day (TID) | ORAL | 0 refills | Status: DC

## 2020-03-23 NOTE — ED Triage Notes (Signed)
Sitting in rocking chair on porch, chair broke.  Hit head and left wrist.  AAOx3.  Skin warm and dry. NAD

## 2020-03-23 NOTE — ED Notes (Signed)
c-collar applied  
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
## 2020-06-24 ENCOUNTER — Other Ambulatory Visit: Payer: Medicare Other

## 2020-06-24 ENCOUNTER — Other Ambulatory Visit: Payer: Self-pay | Admitting: Critical Care Medicine

## 2020-06-24 DIAGNOSIS — Z20822 Contact with and (suspected) exposure to covid-19: Secondary | ICD-10-CM

## 2020-06-26 LAB — SARS-COV-2, NAA 2 DAY TAT

## 2020-06-26 LAB — NOVEL CORONAVIRUS, NAA: SARS-CoV-2, NAA: NOT DETECTED

## 2020-08-05 IMAGING — MR MR PELVIS WO/W CM
4 of 8 series · 18 of 48 positions shown · IV contrast (multihance)
Comparison: CT abdomen and pelvis 05/06/2018 and 06/18/2018.

CLINICAL DATA: Nausea and constipation for 3 months. History of
hernia repair x2 3 years ago.

EXAM:
MRI PELVIS WITHOUT AND WITH CONTRAST
TECHNIQUE: Multiplanar multisequence MR imaging of the pelvis was performed
both before and after administration of intravenous contrast.
CONTRAST:  15 mL MULTIHANCE GADOBENATE DIMEGLUMINE 529 MG/ML IV SOLN

[Series 3: STIR · coronal · 4.0mm · 0.78mm/px · 3 of 28 slices shown]
[im 1/28]
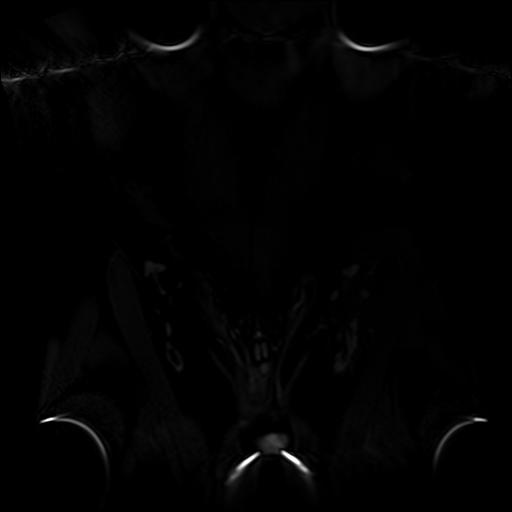
[im 14/28]
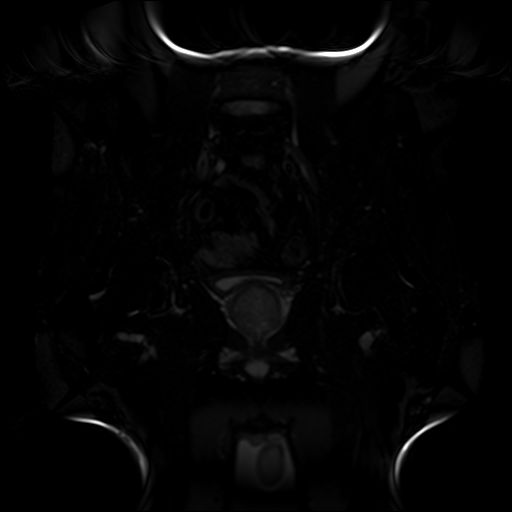
[im 28/28]
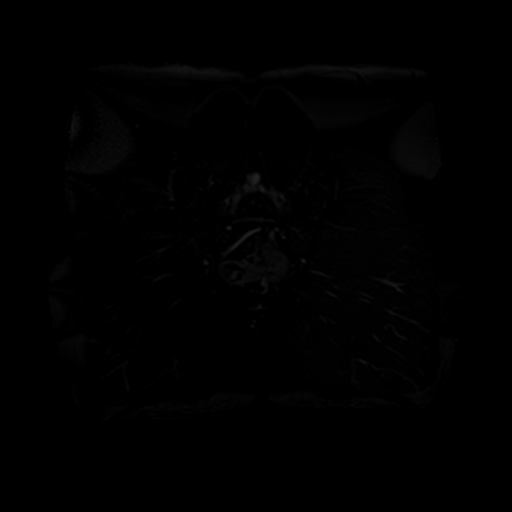

[Series 4: T1 · coronal · 4.0mm · 0.62mm/px · 3 of 28 slices shown (1 of 2)]
[im 1/28]
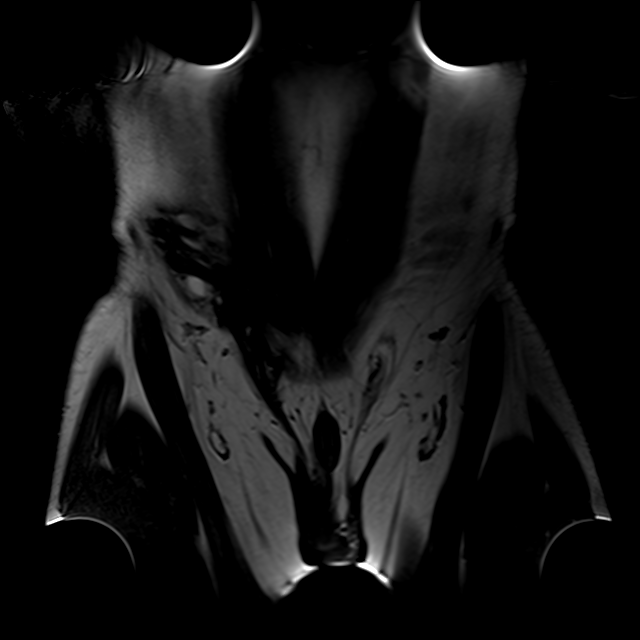
[im 14/28]
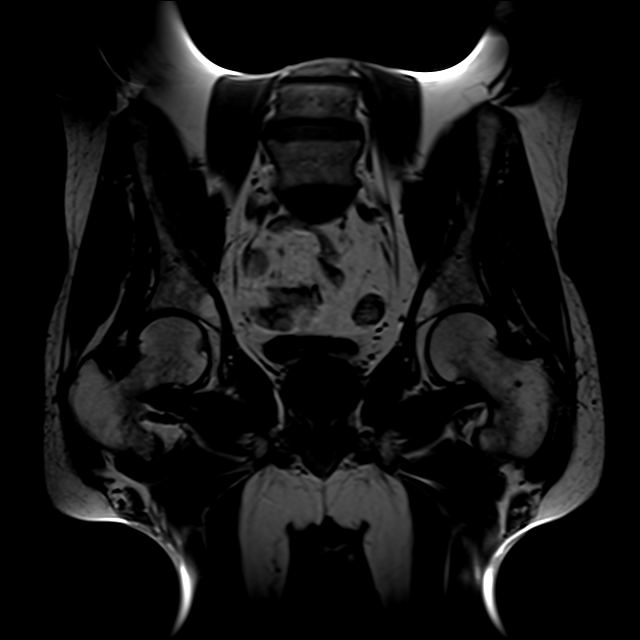
[im 28/28]
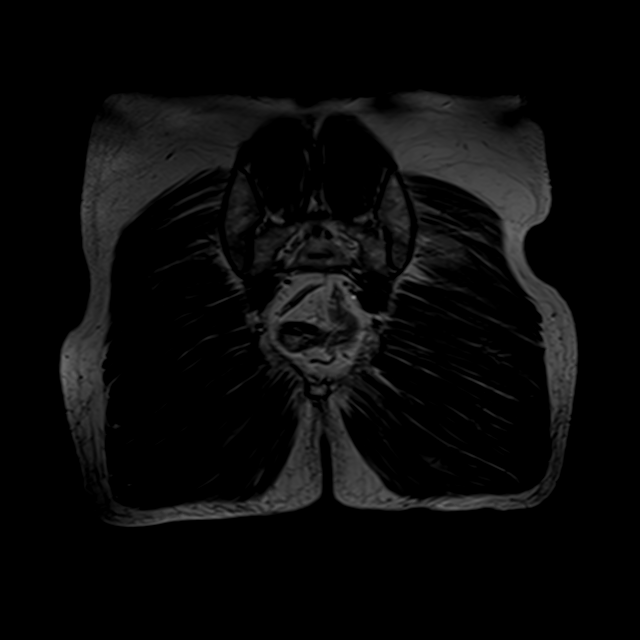

[Series 5: T1 · axial · 4.0mm · 0.72mm/px · z∈[-243,+52]mm · 7 of 60 slices shown (2 of 2)]
[im 1/60]
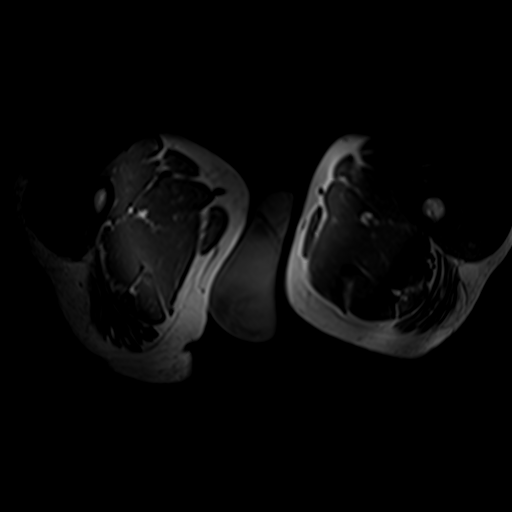
[im 10/60]
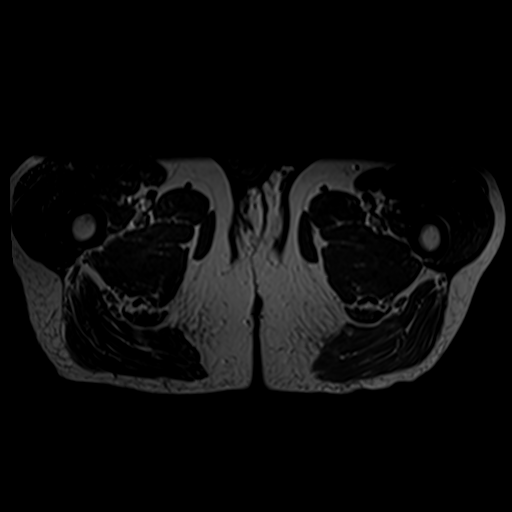
[im 20/60]
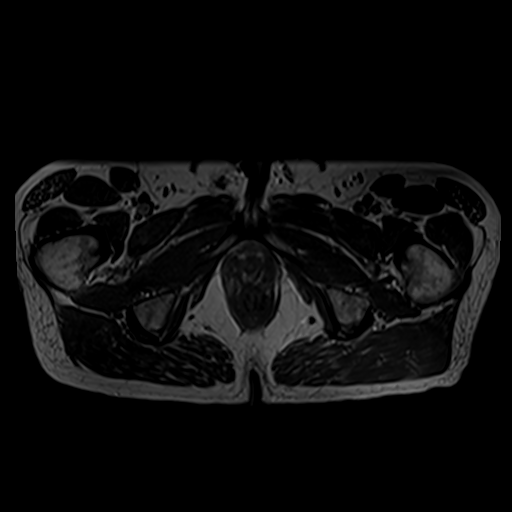
[im 30/60]
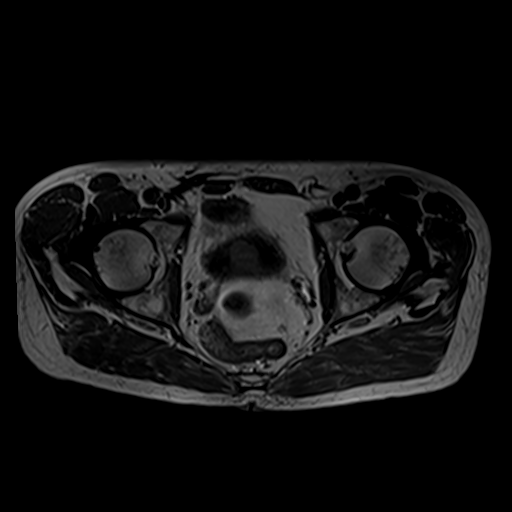
[im 40/60]
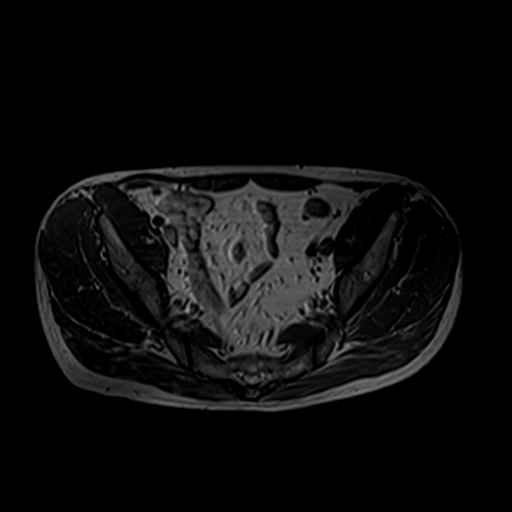
[im 50/60]
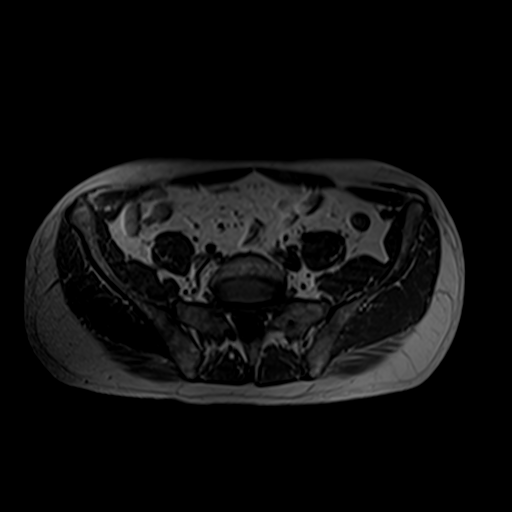
[im 60/60]
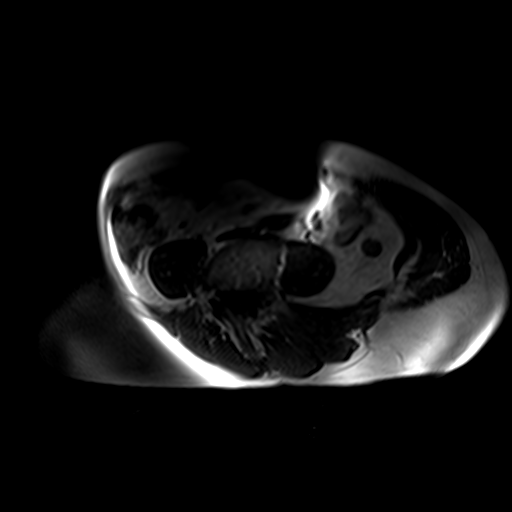

[Series 6: T2 fat-sat · axial · 4.0mm · 0.72mm/px · z∈[-243,+2]mm · 5 of 60 slices shown]
[im 1/60]
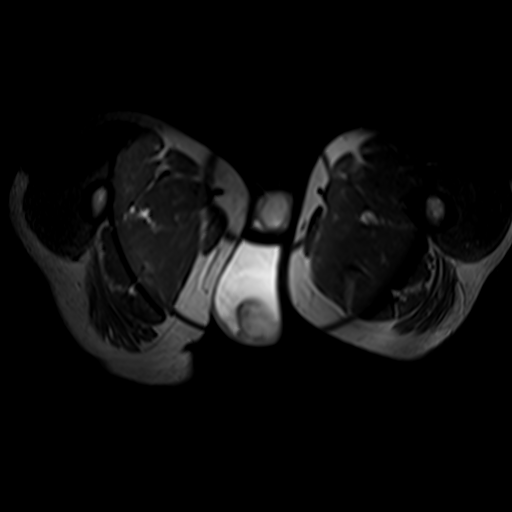
[im 10/60]
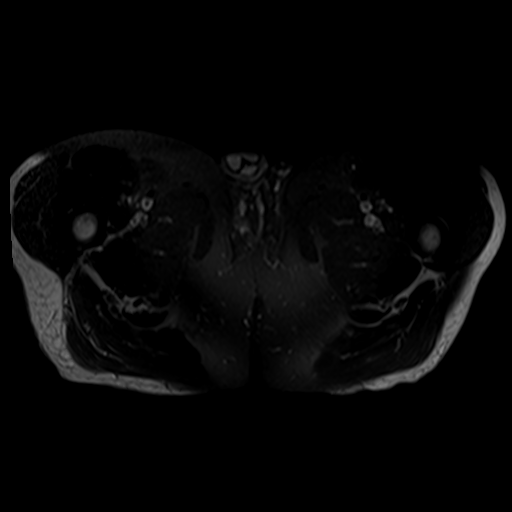
[im 20/60]
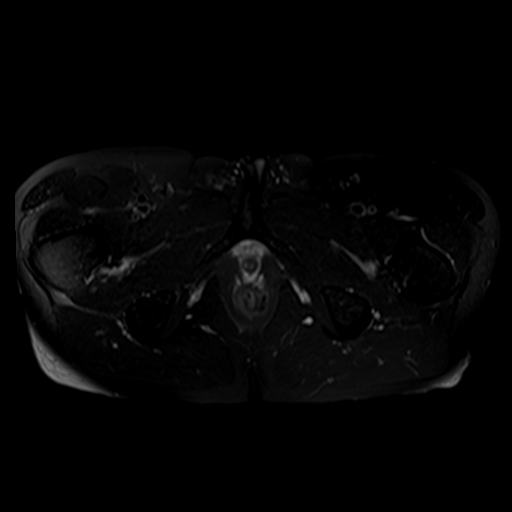
[im 30/60]
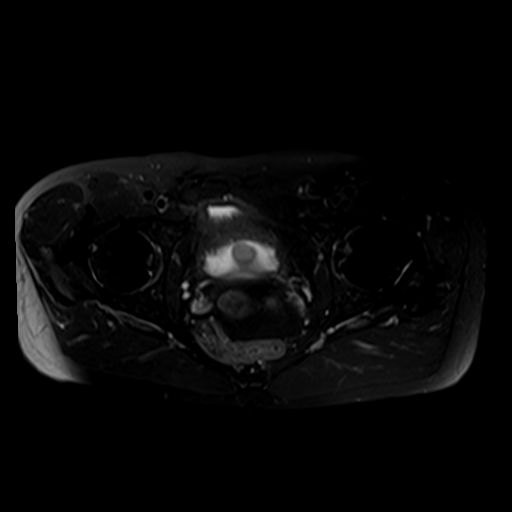
[im 50/60]
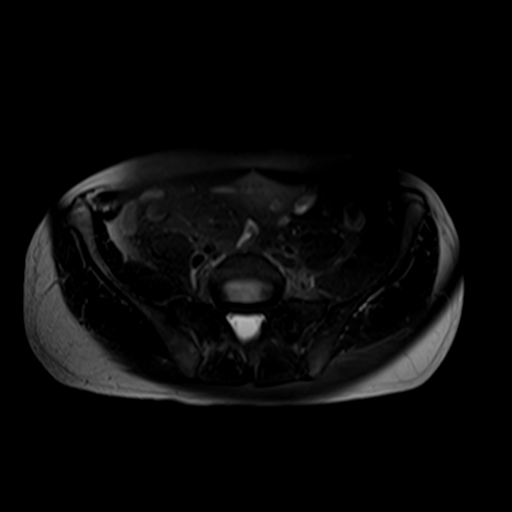

[18 of 48 positions shown; findings below may reference images not displayed]

FINDINGS: Urinary Tract: The urinary bladder is decompressed but otherwise
unremarkable in appearance.

Bowel:  All imaged bowel loops appear normal.

Vascular/Lymphatic: Negative.

Reproductive: Prostatomegaly is seen as on the prior examinations.
There is partial visualization of a large right hydrocele.

Other: No fluid collection is identified. No hernia is seen. There
is no mass.

Musculoskeletal: All imaged bones demonstrate normal signal. No
fracture, stress change or worrisome lesion is seen. Imaged
musculature is normal in appearance.
IMPRESSION: No acute abnormality within the pelvis.

Prostatomegaly.

Partial visualization of a large right hydrocele.

## 2020-10-22 DIAGNOSIS — U071 COVID-19: Secondary | ICD-10-CM

## 2020-10-22 HISTORY — DX: COVID-19: U07.1

## 2020-11-12 ENCOUNTER — Observation Stay
Admission: EM | Admit: 2020-11-12 | Discharge: 2020-11-13 | Disposition: A | Discharge status: Home / Self Care | Payer: Medicare Other | Attending: Internal Medicine | Admitting: Internal Medicine

## 2020-11-12 ENCOUNTER — Emergency Department: Payer: Medicare Other

## 2020-11-12 ENCOUNTER — Other Ambulatory Visit: Payer: Self-pay

## 2020-11-12 DIAGNOSIS — U071 COVID-19: Secondary | ICD-10-CM

## 2020-11-12 DIAGNOSIS — Z20822 Contact with and (suspected) exposure to covid-19: Secondary | ICD-10-CM | POA: Insufficient documentation

## 2020-11-12 DIAGNOSIS — F17210 Nicotine dependence, cigarettes, uncomplicated: Secondary | ICD-10-CM | POA: Diagnosis not present

## 2020-11-12 DIAGNOSIS — R112 Nausea with vomiting, unspecified: Secondary | ICD-10-CM | POA: Diagnosis not present

## 2020-11-12 DIAGNOSIS — K922 Gastrointestinal hemorrhage, unspecified: Principal | ICD-10-CM

## 2020-11-12 DIAGNOSIS — R079 Chest pain, unspecified: Secondary | ICD-10-CM | POA: Diagnosis present

## 2020-11-12 DIAGNOSIS — Z8616 Personal history of COVID-19: Secondary | ICD-10-CM | POA: Diagnosis not present

## 2020-11-12 LAB — TROPONIN I (HIGH SENSITIVITY)
Troponin I (High Sensitivity): 2 ng/L (ref <18)
Troponin I (High Sensitivity): 3 ng/L (ref <18)

## 2020-11-12 LAB — BASIC METABOLIC PANEL
Anion gap: 10 (ref 5–15)
BUN: 16 mg/dL (ref 6–20)
CO2: 28 mmol/L (ref 22–32)
Calcium: 9 mg/dL (ref 8.9–10.3)
Chloride: 101 mmol/L (ref 98–111)
Creatinine, Ser: 1.02 mg/dL (ref 0.61–1.24)
GFR, Estimated: >60 mL/min (ref >60)
Glucose, Bld: 105 mg/dL — ABNORMAL HIGH (ref 70–99)
Potassium: 4 mmol/L (ref 3.5–5.1)
Sodium: 139 mmol/L (ref 135–145)

## 2020-11-12 LAB — CBC
HCT: 42.3 % (ref 39.0–52.0)
Hemoglobin: 14.7 g/dL (ref 13.0–17.0)
MCH: 33.4 pg (ref 26.0–34.0)
MCHC: 34.8 g/dL (ref 30.0–36.0)
MCV: 96.1 fL (ref 80.0–100.0)
Platelets: 328 10*3/uL (ref 150–400)
RBC: 4.4 MIL/uL (ref 4.22–5.81)
RDW: 11.6 % (ref 11.5–15.5)
WBC: 8.7 10*3/uL (ref 4.0–10.5)
nRBC: 0 % (ref 0.0–0.2)

## 2020-11-12 LAB — LIPASE, BLOOD: Lipase: 27 U/L (ref 11–51)

## 2020-11-12 MED ORDER — ACETAMINOPHEN 500 MG PO TABS
1000.0000 mg | ORAL_TABLET | Freq: Once | ORAL | Status: AC
  Administered 2020-11-12: 1000 mg via ORAL
  Filled 2020-11-12: qty 2

## 2020-11-12 MED ORDER — OXYCODONE HCL 5 MG PO TABS
5.0000 mg | ORAL_TABLET | Freq: Once | ORAL | Status: AC | PRN
  Administered 2020-11-12: 5 mg via ORAL
  Filled 2020-11-12: qty 1

## 2020-11-12 MED ORDER — IOHEXOL 350 MG/ML SOLN
75.0000 mL | Freq: Once | INTRAVENOUS | Status: AC | PRN
  Administered 2020-11-12: 75 mL via INTRAVENOUS

## 2020-11-12 MED ORDER — MORPHINE SULFATE (PF) 2 MG/ML IV SOLN
1.0000 mg | Freq: Once | INTRAVENOUS | Status: AC
Start: 2020-11-12 — End: 2020-11-13
  Administered 2020-11-13: 1 mg via INTRAVENOUS
  Filled 2020-11-12: qty 1

## 2020-11-12 MED ORDER — ACETAMINOPHEN 500 MG PO TABS: 1000.0000 mg | ORAL_TABLET | Freq: Four times a day (QID) | ORAL | Status: DC | PRN

## 2020-11-12 MED ORDER — ALUM & MAG HYDROXIDE-SIMETH 200-200-20 MG/5ML PO SUSP: 30.0000 mL | Freq: Four times a day (QID) | ORAL | Status: DC | PRN

## 2020-11-12 MED ORDER — FENTANYL CITRATE (PF) 100 MCG/2ML IJ SOLN
50.0000 ug | Freq: Once | INTRAMUSCULAR | Status: AC
  Administered 2020-11-12: 50 ug via INTRAVENOUS
  Filled 2020-11-12: qty 2

## 2020-11-12 MED ORDER — ONDANSETRON HCL 4 MG/2ML IJ SOLN: 4.0000 mg | Freq: Four times a day (QID) | INTRAMUSCULAR | Status: DC | PRN

## 2020-11-12 MED ORDER — LIDOCAINE VISCOUS HCL 2 % MT SOLN
15.0000 mL | Freq: Once | OROMUCOSAL | Status: AC
  Administered 2020-11-12: 15 mL via ORAL
  Filled 2020-11-12: qty 15

## 2020-11-12 MED ORDER — SODIUM CHLORIDE 0.9 % IV SOLN
80.0000 mg | Freq: Once | INTRAVENOUS | Status: AC
  Administered 2020-11-12: 80 mg via INTRAVENOUS
  Filled 2020-11-12: qty 80

## 2020-11-12 MED ORDER — SODIUM CHLORIDE 0.9 % IV SOLN
8.0000 mg/h | INTRAVENOUS | Status: DC
  Administered 2020-11-12 – 2020-11-13 (×2): 8 mg/h via INTRAVENOUS
  Filled 2020-11-12 (×2): qty 80

## 2020-11-12 MED ORDER — SODIUM CHLORIDE 0.9 % IV SOLN: INTRAVENOUS | Status: DC

## 2020-11-12 MED ORDER — SODIUM CHLORIDE 0.9 % IV BOLUS
1000.0000 mL | Freq: Once | INTRAVENOUS | Status: AC
  Administered 2020-11-12: 1000 mL via INTRAVENOUS

## 2020-11-12 MED ORDER — LIDOCAINE VISCOUS HCL 2 % MT SOLN: 15.0000 mL | Freq: Four times a day (QID) | OROMUCOSAL | Status: DC | PRN

## 2020-11-12 MED ORDER — ALUM & MAG HYDROXIDE-SIMETH 200-200-20 MG/5ML PO SUSP
30.0000 mL | Freq: Once | ORAL | Status: AC
  Administered 2020-11-12: 30 mL via ORAL
  Filled 2020-11-12: qty 30

## 2020-11-12 MED ORDER — ONDANSETRON HCL 4 MG/2ML IJ SOLN
4.0000 mg | Freq: Once | INTRAMUSCULAR | Status: AC
  Administered 2020-11-12: 4 mg via INTRAVENOUS
  Filled 2020-11-12: qty 2
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
## 2020-11-12 NOTE — ED Notes (Signed)
Patient upset with Roxicodone for severe pain.  He states, "If that is all I get, then I could take it at home."  Patient reminded we have him on a Protonix drip to heal his stomach and it is treatment he could not get at home. He responds, "Am I going to hurt like this through the whole Covid thing?" Advised, "Probably for a while
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
## 2020-11-12 NOTE — ED Triage Notes (Signed)
Pt to ED [facility] from home for chief complaint of chest pressure, back pain since yesterday. Reports vomiting x2 this morning with a red color.  Skin color WDL COVID+ 12/23 No vomiting in triage or noted to clothes Pt alert and oriented, RR even and unlabored, NDA noted

## 2020-11-12 NOTE — ED Triage Notes (Signed)
Pt comes into the ED via [facility] from home c/o vomiting with blood present.  Pt was diagnosed with COVID 10/23/20.  Pt has some abdominal pain and still continuing to have COVID symptoms.  150/98, 96Hr, 98% RA, CBG 145, 97.6.

## 2020-11-13 ENCOUNTER — Encounter: Payer: Self-pay | Admitting: Internal Medicine

## 2020-11-13 ENCOUNTER — Encounter
Admission: EM | Disposition: A | Discharge status: Home / Self Care | Payer: Self-pay | Source: Home / Self Care | Attending: Emergency Medicine

## 2020-11-13 ENCOUNTER — Observation Stay: Payer: Medicare Other | Admitting: Certified Registered"

## 2020-11-13 DIAGNOSIS — K922 Gastrointestinal hemorrhage, unspecified: Secondary | ICD-10-CM | POA: Diagnosis not present

## 2020-11-13 DIAGNOSIS — R112 Nausea with vomiting, unspecified: Secondary | ICD-10-CM | POA: Diagnosis not present

## 2020-11-13 HISTORY — PX: ESOPHAGOGASTRODUODENOSCOPY: SHX5428

## 2020-11-13 LAB — BASIC METABOLIC PANEL
Anion gap: 8 (ref 5–15)
BUN: 14 mg/dL (ref 6–20)
CO2: 26 mmol/L (ref 22–32)
Calcium: 8.3 mg/dL — ABNORMAL LOW (ref 8.9–10.3)
Chloride: 104 mmol/L (ref 98–111)
Creatinine, Ser: 0.85 mg/dL (ref 0.61–1.24)
GFR, Estimated: >60 mL/min (ref >60)
Glucose, Bld: 103 mg/dL — ABNORMAL HIGH (ref 70–99)
Potassium: 3.9 mmol/L (ref 3.5–5.1)
Sodium: 138 mmol/L (ref 135–145)

## 2020-11-13 LAB — CBC
HCT: 37.8 % — ABNORMAL LOW (ref 39.0–52.0)
Hemoglobin: 13.2 g/dL (ref 13.0–17.0)
MCH: 33.9 pg (ref 26.0–34.0)
MCHC: 34.9 g/dL (ref 30.0–36.0)
MCV: 97.2 fL (ref 80.0–100.0)
Platelets: 276 10*3/uL (ref 150–400)
RBC: 3.89 MIL/uL — ABNORMAL LOW (ref 4.22–5.81)
RDW: 11.7 % (ref 11.5–15.5)
WBC: 6.1 10*3/uL (ref 4.0–10.5)
nRBC: 0 % (ref 0.0–0.2)

## 2020-11-13 LAB — HIV ANTIBODY (ROUTINE TESTING W REFLEX): HIV Screen 4th Generation wRfx: NONREACTIVE

## 2020-11-13 LAB — SARS CORONAVIRUS 2 (TAT 6-24 HRS): SARS Coronavirus 2: NEGATIVE

## 2020-11-13 SURGERY — EGD (ESOPHAGOGASTRODUODENOSCOPY)
Anesthesia: General

## 2020-11-13 MED ORDER — LIDOCAINE HCL (PF) 2 % IJ SOLN
INTRAMUSCULAR | Status: DC | PRN
  Administered 2020-11-13: 80 mg

## 2020-11-13 MED ORDER — PROPOFOL 500 MG/50ML IV EMUL
INTRAVENOUS | Status: DC | PRN
  Administered 2020-11-13: 50 ug/kg/min via INTRAVENOUS

## 2020-11-13 MED ORDER — FENTANYL CITRATE (PF) 100 MCG/2ML IJ SOLN
INTRAMUSCULAR | Status: AC
  Filled 2020-11-13: qty 2

## 2020-11-13 MED ORDER — PANTOPRAZOLE SODIUM 40 MG PO TBEC: 40.0000 mg | DELAYED_RELEASE_TABLET | Freq: Every day | ORAL | 1 refills | Status: DC

## 2020-11-13 MED ORDER — FENTANYL CITRATE (PF) 100 MCG/2ML IJ SOLN
INTRAMUSCULAR | Status: DC | PRN
  Administered 2020-11-13 (×2): 50 ug via INTRAVENOUS

## 2020-11-13 MED ORDER — MIDAZOLAM HCL 2 MG/2ML IJ SOLN
INTRAMUSCULAR | Status: AC
  Filled 2020-11-13: qty 2

## 2020-11-13 MED ORDER — PROPOFOL 500 MG/50ML IV EMUL
INTRAVENOUS | Status: AC
  Filled 2020-11-13: qty 50

## 2020-11-13 MED ORDER — LACTATED RINGERS IV SOLN: INTRAVENOUS | Status: DC | PRN

## 2020-11-13 MED ORDER — MIDAZOLAM HCL 5 MG/5ML IJ SOLN
INTRAMUSCULAR | Status: DC | PRN
  Administered 2020-11-13: 2 mg via INTRAVENOUS

## 2020-11-13 MED ORDER — SODIUM CHLORIDE 0.9 % IV SOLN: INTRAVENOUS | Status: DC

## 2020-11-13 MED ORDER — PROPOFOL 10 MG/ML IV BOLUS
INTRAVENOUS | Status: DC | PRN
  Administered 2020-11-13: 50 mg via INTRAVENOUS
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
## 2020-11-13 NOTE — ED Notes (Signed)
Pt refused tylenol for headache at this time.

## 2020-11-13 NOTE — Op Note (Signed)
[facility] Patient Name: [name] Procedure Date: 11/13/2020 12:33 PM MRN: [id number] Account #: [id number] Date of Birth: [birth year] Admit Type: Outpatient Age: 60 Room: [facility] ENDO ROOM 4 Gender: Male Note Status: Finalized Procedure:             Upper GI endoscopy Indications:           Hematemesis Providers:             Wyline Mood MD, MD Medicines:             Monitored Anesthesia Care Complications:         No immediate complications. Procedure:             Pre-Anesthesia Assessment:                        - Prior to the procedure, a History and Physical was                         performed, and patient medications, allergies and                         sensitivities were reviewed. The patient's tolerance                         of previous anesthesia was reviewed.                        - The risks and benefits of the procedure and the                         sedation options and risks were discussed with the                         patient. All questions were answered and informed                         consent was obtained.                        - ASA Grade Assessment: II - A patient with mild                         systemic disease.                        After obtaining informed consent, the endoscope was                         passed under direct vision. Throughout the procedure,                         the patient's blood pressure, pulse, and oxygen                         saturations were monitored continuously. The Endoscope                         was introduced through the mouth, and advanced to the  third part of duodenum. The upper GI endoscopy was                         accomplished with ease. The patient tolerated the                         procedure well. Findings:      The esophagus was normal.      The stomach was normal.      The examined duodenum was normal. Impression:            -  Normal esophagus.                        - Normal stomach.                        - Normal examined duodenum.                        - No specimens collected. Recommendation:        - Return patient to hospital ward for possible                         discharge same day.                        - Advance diet as tolerated.                        - Continue present medications. Procedure Code(s):     --- Professional ---                        [phone], Esophagogastroduodenoscopy, flexible,                         transoral; diagnostic, including collection of                         specimen(s) by brushing or washing, when performed                         (separate procedure) Diagnosis Code(s):     --- Professional ---                        K92.0, Hematemesis CPT copyright 2019 American Medical Association. All rights reserved. The codes documented in this report are preliminary and upon coder review Lord  be revised to meet current compliance requirements. Wyline Mood, MD Wyline Mood MD, MD 11/13/2020 12:44:36 PM This report has been signed electronically. Number of Addenda: 0 Note Initiated On: 11/13/2020 12:33 PM Estimated Blood Loss:  Estimated blood loss: none.      [facility]
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
## 2020-11-13 NOTE — Transfer of Care (Signed)
Immediate Anesthesia Transfer of Care Note  Patient: [name]  Procedure(s) Performed: ESOPHAGOGASTRODUODENOSCOPY (EGD) (N/A )  Patient Location: PACU  Anesthesia Type:General  Level of Consciousness: sedated  Airway & Oxygen Therapy: Patient Spontanous Breathing  Post-op Assessment: Report given to RN and Post -op Vital signs reviewed and stable  Post vital signs: Reviewed and stable  Last Vitals:  Vitals Value Taken Time  BP 113/86 11/13/20 1248  Temp 36.1 C 11/13/20 1247  Pulse 90 11/13/20 1252  Resp 17 11/13/20 1252  SpO2 96 % 11/13/20 1252  Vitals shown include unvalidated device data.  Last Pain:  Vitals:   11/13/20 1247  TempSrc: Temporal  PainSc: 0-No pain         Complications: No complications documented.

## 2020-11-13 NOTE — Discharge Summary (Signed)
Physician Discharge Summary  AMMAN BARTEL WLS:[id number] DOB: 1960-12-19 DOA: 11/12/2020  PCP: Danella Penton, MD  Admit date: 11/12/2020 Discharge date: 11/13/2020  Admitted From: Home Disposition: Home  Recommendations for Outpatient Follow-up:  1. Follow up with PCP in 1-2 weeks 2. Please obtain BMP/CBC in one week 3. Please follow up on the following pending results: None  Home Health: No Equipment/Devices: None Discharge Condition: Stable CODE STATUS: Full Diet recommendation: Heart Healthy   Brief/Interim Summary: [name] a 60 y.o.malewith a past medical history of COVID-positive status 10/22/2020 presents to the emergency department for chest pain, nausea vomiting with hematemesis this morning. According to the patient he was diagnosed with COVID 10/22/2020 at that time he had fever shortness of breath nausea. States most of his symptoms improved until 2 days ago. 2 days ago he began experiencing chest pain with radiation to the back as well as nausea. Patient states continued generalized fatigue and headaches. Patient vomited twice this morning around 930 and 10:30 AM both of which were mostly blood per patient. No associated abdominal pain with emesis. Denies hematochezia or melena.Patient states a history of gastric ulceration approximately 20 years ago but none since. Denies any recent fever.Endorses current headache which he attributes to COVID. Does not drink EtOh. Denies significant NSAID use. Not on anticoagulation.  Repeat COVID test came back negative.  Patient completed his quarantine. GI was consulted and he underwent EGD which was normal. Hemoglobin remained stable and no episode of hematemesis while in the hospital.  He was discharged home on Protonix for few weeks and will follow-up with his primary care provider for further needs.  Discharge Diagnoses:  Active Problems:   Chest pain   Upper GI bleed   COVID-19 virus infection   Nausea and  vomiting   Discharge Instructions  Discharge Instructions    Diet - low sodium heart healthy   Complete by: As directed    Discharge instructions   Complete by: As directed    It was pleasure taking care of you. As you were told by your gastroenterologist that your EGD is completely normal. You might have some gastritis , advance your diet slowly and take Protonix for couple of weeks. Follow-up with your primary care provider for further management.   Increase activity slowly   Complete by: As directed      Allergies as of 11/13/2020      Reactions   Codeine Nausea And Vomiting   Ketorolac Tromethamine Nausea And Vomiting      Medication List    TAKE these medications   gabapentin 300 MG capsule Commonly known as: NEURONTIN Take 600 mg by mouth 3 (three) times daily.   oxyCODONE-acetaminophen 5-325 MG tablet Commonly known as: Percocet Take 1 tablet by mouth every 6 (six) hours as needed.   pantoprazole 40 MG tablet Commonly known as: Protonix Take 1 tablet (40 mg total) by mouth daily.   promethazine 25 MG tablet Commonly known as: PHENERGAN Take 25 mg by mouth 4 (four) times daily as needed for nausea or vomiting.   tiZANidine 4 MG tablet Commonly known as: Zanaflex Take 1 tablet (4 mg total) by mouth 3 (three) times daily.       Follow-up Information    Danella Penton, MD. Schedule an appointment as soon as possible for a visit in 1 week(s).   Specialty: Internal Medicine Contact information: [facility] [address] [place] [address] [phone]  Allergies  Allergen Reactions  . Codeine Nausea And Vomiting  . Ketorolac Tromethamine Nausea And Vomiting    Consultations:  Gastroenterology  Procedures/Studies: DG Chest 2 View  Result Date: 11/12/2020 CLINICAL DATA:  Hematemesis. EXAM: CHEST - 2 VIEW COMPARISON:  Single-view of the chest 06/04/2019. FINDINGS: Mild linear atelectasis in the left lung base. Lungs  otherwise clear. Heart size normal. No pneumothorax or pleural effusion. No acute or focal bony abnormality. IMPRESSION: No acute disease. Electronically Signed   By: Drusilla Kanner M.D.   On: 11/12/2020 14:13   CT Angio Chest PE W and/or Wo Contrast  Addendum Date: 11/12/2020   ADDENDUM REPORT: 11/12/2020 17:46 ADDENDUM: Poorly defined right thyroid nodule, proximally 1.6 cm in size. Recommend thyroid US (ref: J Am Coll Radiol. 2015 Feb;12(2): 143-50). Electronically Signed   By: [name]   On: 11/12/2020 17:46   Result Date: 11/12/2020 CLINICAL DATA:  Chest pressure and back pain since yesterday. Vomiting x2 this morning. COVID positive test on 10/22/2020. EXAM: CT ANGIOGRAPHY CHEST WITH CONTRAST TECHNIQUE: Multidetector CT imaging of the chest was performed using the standard protocol during bolus administration of intravenous contrast. Multiplanar CT image reconstructions and MIPs were obtained to evaluate the vascular anatomy. CONTRAST:  23mL OMNIPAQUE IOHEXOL 350 MG/ML SOLN COMPARISON:  06/05/2018 FINDINGS: Cardiovascular: Pulmonary arteries are well opacified. There is no evidence of a pulmonary embolism. Heart is normal in size and configuration. No pericardial effusion. Mild left coronary artery calcifications. Great vessels are normal in caliber. No aortic dissection. Minor aortic atherosclerosis. Mediastinum/Nodes: Poorly defined right thyroid nodule, measuring approximately 1.6 cm in size, which Ates be larger than on the prior chest CT. No neck base, axillary, mediastinal or hilar masses or enlarged lymph nodes. Trachea and esophagus are unremarkable. Lungs/Pleura: Mild dependent opacity in the lower lobes consistent with atelectasis. Lungs are otherwise clear. Subtle findings of centrilobular emphysema. No pleural effusion or pneumothorax. Upper Abdomen: No acute abnormality. Musculoskeletal: No fracture or acute finding. No bone lesion. Mild degenerative changes along the mid to lower  thoracic spine. No chest wall masses. Review of the MIP images confirms the above findings. IMPRESSION: 1. No evidence of a pulmonary embolism. 2. No acute findings. 3. Mild dependent atelectasis in the lower lobes. 4. Minor centrilobular emphysema and aortic atherosclerosis. Aortic Atherosclerosis (ICD10-I70.0) and Emphysema (ICD10-J43.9). Electronically Signed: By: [name] On: 11/12/2020 17:31     Subjective: Patient was complaining of some headache when seen today.  No more nausea or vomiting.  He had a very small amount of blood in his vomitus yesterday.  Denies any NSAID use.  No alcohol use. Going for EGD which was normal so he will be discharged.  Discharge Exam: Vitals:   11/13/20 1337 11/13/20 1347  BP: (!) 141/84   Pulse: 69 70  Resp: 18 15  Temp:    SpO2: 99% 98%   Vitals:   11/13/20 1317 11/13/20 1327 11/13/20 1337 11/13/20 1347  BP: 125/88 118/89 (!) 141/84   Pulse: 76 78 69 70  Resp: 13 15 18 15   Temp:      TempSrc:      SpO2: 98% 98% 99% 98%  Weight:      Height:        General: Pt is alert, awake, not in acute distress Cardiovascular: RRR, S1/S2 +, no rubs, no gallops Respiratory: CTA bilaterally, no wheezing, no rhonchi Abdominal: Soft, NT, ND, bowel sounds + Extremities: no edema, no cyanosis   The results of  significant diagnostics from this hospitalization (including imaging, microbiology, ancillary and laboratory) are listed below for reference.    Microbiology: Recent Results (from the past 240 hour(s))  SARS CORONAVIRUS 2 (TAT 6-24 HRS) Nasopharyngeal Nasopharyngeal Swab     Status: None   Collection Time: 11/12/20  9:59 PM   Specimen: Nasopharyngeal Swab  Result Value Ref Range Status   SARS Coronavirus 2 NEGATIVE NEGATIVE Final    Comment: (NOTE) SARS-CoV-2 target nucleic acids are NOT DETECTED.  The SARS-CoV-2 RNA is generally detectable in upper and lower respiratory specimens during the acute phase of infection. Negative results  do not preclude SARS-CoV-2 infection, do not rule out co-infections with other pathogens, and should not be used as the sole basis for treatment or other patient management decisions. Negative results must be combined with clinical observations, patient history, and epidemiological information. The expected result is Negative.  Fact Sheet for Patients: HairSlick.no  Fact Sheet for Healthcare Providers: quierodirigir.com  This test is not yet approved or cleared by the [place] FDA and  has been authorized for detection and/or diagnosis of SARS-CoV-2 by FDA under an Emergency Use Authorization (EUA). This EUA will remain  in effect (meaning this test can be used) for the duration of the COVID-19 declaration under Se ction 564(b)(1) of the Act, 21 U.S.C. section 360bbb-3(b)(1), unless the authorization is terminated or revoked sooner.  Performed at [facility], [address] [place], [place] [address]      Labs: BNP (last 3 results) No results for input(s): BNP in the last 8760 hours. Basic Metabolic Panel: Recent Labs  Lab 11/12/20 1335 11/13/20 0500  NA 139 138  K 4.0 3.9  CL 101 104  CO2 28 26  GLUCOSE 105* 103*  BUN 16 14  CREATININE 1.02 0.85  CALCIUM 9.0 8.3*   Liver Function Tests: No results for input(s): AST, ALT, ALKPHOS, BILITOT, PROT, ALBUMIN in the last 168 hours. Recent Labs  Lab 11/12/20 1335  LIPASE 27   No results for input(s): AMMONIA in the last 168 hours. CBC: Recent Labs  Lab 11/12/20 1335 11/13/20 0500  WBC 8.7 6.1  HGB 14.7 13.2  HCT 42.3 37.8*  MCV 96.1 97.2  PLT 328 276   Cardiac Enzymes: No results for input(s): CKTOTAL, CKMB, CKMBINDEX, TROPONINI in the last 168 hours. BNP: Invalid input(s): POCBNP CBG: No results for input(s): GLUCAP in the last 168 hours. D-Dimer No results for input(s): DDIMER in the last 72 hours. Hgb A1c No results for input(s):  HGBA1C in the last 72 hours. Lipid Profile No results for input(s): CHOL, HDL, LDLCALC, TRIG, CHOLHDL, LDLDIRECT in the last 72 hours. Thyroid function studies No results for input(s): TSH, T4TOTAL, T3FREE, THYROIDAB in the last 72 hours.  Invalid input(s): FREET3 Anemia work up No results for input(s): VITAMINB12, FOLATE, FERRITIN, TIBC, IRON, RETICCTPCT in the last 72 hours. Urinalysis    Component Value Date/Time   COLORURINE YELLOW 05/06/2018 2344   APPEARANCEUR Clear 05/31/2018 1412   LABSPEC 1.034 (H) 05/06/2018 2344   LABSPEC 1.029 07/08/2014 1551   PHURINE 6.0 05/06/2018 2344   GLUCOSEU Negative 05/31/2018 1412   GLUCOSEU Negative 07/08/2014 1551   HGBUR SMALL (A) 05/06/2018 2344   BILIRUBINUR Negative 05/31/2018 1412   BILIRUBINUR Negative 07/08/2014 1551   KETONESUR NEGATIVE 05/06/2018 2344   PROTEINUR 1+ (A) 05/31/2018 1412   PROTEINUR NEGATIVE 05/06/2018 2344   UROBILINOGEN 1.0 08/29/2014 1251   NITRITE Negative 05/31/2018 1412   NITRITE NEGATIVE 05/06/2018 2344  LEUKOCYTESUR Negative 05/31/2018 1412   LEUKOCYTESUR Negative 07/08/2014 1551   Sepsis Labs Invalid input(s): PROCALCITONIN,  WBC,  LACTICIDVEN Microbiology Recent Results (from the past 240 hour(s))  SARS CORONAVIRUS 2 (TAT 6-24 HRS) Nasopharyngeal Nasopharyngeal Swab     Status: None   Collection Time: 11/12/20  9:59 PM   Specimen: Nasopharyngeal Swab  Result Value Ref Range Status   SARS Coronavirus 2 NEGATIVE NEGATIVE Final    Comment: (NOTE) SARS-CoV-2 target nucleic acids are NOT DETECTED.  The SARS-CoV-2 RNA is generally detectable in upper and lower respiratory specimens during the acute phase of infection. Negative results do not preclude SARS-CoV-2 infection, do not rule out co-infections with other pathogens, and should not be used as the sole basis for treatment or other patient management decisions. Negative results must be combined with clinical observations, patient history, and  epidemiological information. The expected result is Negative.  Fact Sheet for Patients: HairSlick.nohttps://www.fda.gov/media/138098/download  Fact Sheet for Healthcare Providers: quierodirigir.comhttps://www.fda.gov/media/138095/download  This test is not yet approved or cleared by the [place] FDA and  has been authorized for detection and/or diagnosis of SARS-CoV-2 by FDA under an Emergency Use Authorization (EUA). This EUA will remain  in effect (meaning this test can be used) for the duration of the COVID-19 declaration under Se ction 564(b)(1) of the Act, 21 U.S.C. section 360bbb-3(b)(1), unless the authorization is terminated or revoked sooner.  Performed at [facility], [address] [place], [place] [id number]     Time coordinating discharge: Over 30 minutes  SIGNED:  Arnetha CourserSumayya Ambrie Carte, MD  [facility] 11/13/2020, 1:53 PM  If 7PM-7AM, please contact night-coverage [email or web address]  This record has been created using Conservation officer, historic buildingsDragon voice recognition software. Errors have been sought and corrected,but Baity not always be located. Such creation errors do not reflect on the standard of care.

## 2020-11-13 NOTE — Progress Notes (Signed)
Pt had EGD procedure from Dr. Tobi Bastos, Report and notes viewed by Dr. Shanda Bumps. Explained and discussed discharge instructions of procedure to patient. Patient aware to schedule appointment with PCP for follow up bloodwork. Also mentionned to patient of prescription sent electronically to [facility]. Pt going home with wife with cellphone and belongings.

## 2020-11-16 ENCOUNTER — Encounter: Payer: Self-pay | Admitting: Gastroenterology

## 2020-11-16 NOTE — Anesthesia Postprocedure Evaluation (Signed)
Anesthesia Post Note  Patient: [name]  Procedure(s) Performed: ESOPHAGOGASTRODUODENOSCOPY (EGD) (N/A )  Patient location during evaluation: Endoscopy Anesthesia Type: General Level of consciousness: awake and alert Pain management: pain level controlled Vital Signs Assessment: post-procedure vital signs reviewed and stable Respiratory status: spontaneous breathing, nonlabored ventilation, respiratory function stable and patient connected to nasal cannula oxygen Cardiovascular status: blood pressure returned to baseline and stable Postop Assessment: no apparent nausea or vomiting Anesthetic complications: no   No complications documented.   Last Vitals:  Vitals:   11/13/20 1347 11/13/20 1350  BP:  120/85  Pulse: 70 74  Resp: 15 15  Temp:    SpO2: 98% 98%    Last Pain:  Vitals:   11/13/20 1350  TempSrc:   PainSc: 0-No pain                 Corinda Gubler

## 2020-11-17 ENCOUNTER — Ambulatory Visit
Admission: EM | Admit: 2020-11-17 | Discharge: 2020-11-17 | Disposition: A | Discharge status: Home / Self Care | Payer: Medicare Other | Attending: Family Medicine | Admitting: Family Medicine

## 2020-11-17 ENCOUNTER — Other Ambulatory Visit: Payer: Self-pay

## 2020-11-17 DIAGNOSIS — M79601 Pain in right arm: Secondary | ICD-10-CM

## 2020-11-17 DIAGNOSIS — I809 Phlebitis and thrombophlebitis of unspecified site: Secondary | ICD-10-CM

## 2020-11-17 MED ORDER — DOXYCYCLINE HYCLATE 100 MG PO CAPS: 100.0000 mg | ORAL_CAPSULE | Freq: Two times a day (BID) | ORAL | 0 refills | Status: AC

## 2020-11-17 NOTE — Discharge Instructions (Addendum)
Apply warm compresses to the area.  Take the antibiotics as prescribed Ibuprofen for pain as needed.  Follow up as needed for continued or worsening symptoms

## 2020-11-17 NOTE — ED Triage Notes (Signed)
Pt had IV Thursday-Friday of last week.  Slight redness around IV site started Saturday and has worsened since then. Redness approx 2" across with hardened area.
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
## 2021-12-06 DIAGNOSIS — R1031 Right lower quadrant pain: Secondary | ICD-10-CM | POA: Diagnosis not present

## 2021-12-06 DIAGNOSIS — G8929 Other chronic pain: Secondary | ICD-10-CM | POA: Diagnosis not present

## 2021-12-27 DIAGNOSIS — M50121 Cervical disc disorder at C4-C5 level with radiculopathy: Secondary | ICD-10-CM | POA: Diagnosis not present

## 2021-12-27 DIAGNOSIS — Z0001 Encounter for general adult medical examination with abnormal findings: Secondary | ICD-10-CM | POA: Diagnosis not present

## 2021-12-27 DIAGNOSIS — I6529 Occlusion and stenosis of unspecified carotid artery: Secondary | ICD-10-CM | POA: Diagnosis not present

## 2021-12-27 DIAGNOSIS — Z79899 Other long term (current) drug therapy: Secondary | ICD-10-CM | POA: Diagnosis not present

## 2021-12-27 DIAGNOSIS — R059 Cough, unspecified: Secondary | ICD-10-CM | POA: Diagnosis not present

## 2021-12-27 DIAGNOSIS — M5412 Radiculopathy, cervical region: Secondary | ICD-10-CM | POA: Diagnosis not present

## 2021-12-27 DIAGNOSIS — J449 Chronic obstructive pulmonary disease, unspecified: Secondary | ICD-10-CM | POA: Diagnosis not present

## 2021-12-27 DIAGNOSIS — Z0000 Encounter for general adult medical examination without abnormal findings: Secondary | ICD-10-CM | POA: Diagnosis not present

## 2021-12-31 DIAGNOSIS — R1031 Right lower quadrant pain: Secondary | ICD-10-CM | POA: Diagnosis not present

## 2021-12-31 DIAGNOSIS — G8929 Other chronic pain: Secondary | ICD-10-CM | POA: Diagnosis not present

## 2022-01-13 DIAGNOSIS — J449 Chronic obstructive pulmonary disease, unspecified: Secondary | ICD-10-CM | POA: Diagnosis not present

## 2022-01-13 DIAGNOSIS — Z125 Encounter for screening for malignant neoplasm of prostate: Secondary | ICD-10-CM | POA: Diagnosis not present

## 2022-01-31 DIAGNOSIS — G8929 Other chronic pain: Secondary | ICD-10-CM | POA: Diagnosis not present

## 2022-01-31 DIAGNOSIS — R1031 Right lower quadrant pain: Secondary | ICD-10-CM | POA: Diagnosis not present

## 2022-03-01 DIAGNOSIS — G8929 Other chronic pain: Secondary | ICD-10-CM | POA: Diagnosis not present

## 2022-03-01 DIAGNOSIS — R1031 Right lower quadrant pain: Secondary | ICD-10-CM | POA: Diagnosis not present

## 2022-03-07 DIAGNOSIS — R1032 Left lower quadrant pain: Secondary | ICD-10-CM | POA: Diagnosis not present

## 2022-03-07 DIAGNOSIS — M1612 Unilateral primary osteoarthritis, left hip: Secondary | ICD-10-CM | POA: Diagnosis not present

## 2022-03-07 DIAGNOSIS — R1030 Lower abdominal pain, unspecified: Secondary | ICD-10-CM | POA: Diagnosis not present

## 2022-03-08 DIAGNOSIS — M1612 Unilateral primary osteoarthritis, left hip: Secondary | ICD-10-CM | POA: Diagnosis not present

## 2022-04-19 DIAGNOSIS — R1031 Right lower quadrant pain: Secondary | ICD-10-CM | POA: Diagnosis not present

## 2022-04-19 DIAGNOSIS — G8929 Other chronic pain: Secondary | ICD-10-CM | POA: Diagnosis not present

## 2022-05-23 DIAGNOSIS — G8929 Other chronic pain: Secondary | ICD-10-CM | POA: Diagnosis not present

## 2022-05-23 DIAGNOSIS — R1031 Right lower quadrant pain: Secondary | ICD-10-CM | POA: Diagnosis not present

## 2022-05-27 ENCOUNTER — Other Ambulatory Visit: Payer: Self-pay

## 2022-05-27 ENCOUNTER — Encounter: Payer: Self-pay | Admitting: Emergency Medicine

## 2022-05-27 ENCOUNTER — Emergency Department
Admission: EM | Admit: 2022-05-27 | Discharge: 2022-05-27 | Disposition: A | Discharge status: Home / Self Care | Payer: Medicare Other | Attending: Emergency Medicine | Admitting: Emergency Medicine

## 2022-05-27 ENCOUNTER — Emergency Department: Payer: Medicare Other

## 2022-05-27 DIAGNOSIS — M1612 Unilateral primary osteoarthritis, left hip: Secondary | ICD-10-CM | POA: Diagnosis not present

## 2022-05-27 DIAGNOSIS — M25551 Pain in right hip: Secondary | ICD-10-CM | POA: Insufficient documentation

## 2022-05-27 DIAGNOSIS — M25552 Pain in left hip: Secondary | ICD-10-CM | POA: Diagnosis not present

## 2022-05-27 MED ORDER — MORPHINE SULFATE (PF) 4 MG/ML IV SOLN
4.0000 mg | Freq: Once | INTRAVENOUS | Status: AC
  Administered 2022-05-27: 4 mg via INTRAVENOUS
  Filled 2022-05-27: qty 1

## 2022-05-27 MED ORDER — METOCLOPRAMIDE HCL 5 MG/ML IJ SOLN
10.0000 mg | Freq: Once | INTRAMUSCULAR | Status: AC
  Administered 2022-05-27: 10 mg via INTRAVENOUS
  Filled 2022-05-27: qty 2

## 2022-05-27 MED ORDER — ONDANSETRON HCL 4 MG/2ML IJ SOLN
4.0000 mg | Freq: Once | INTRAMUSCULAR | Status: AC
Start: 2022-05-27 — End: 2022-05-27
  Administered 2022-05-27: 4 mg via INTRAVENOUS
  Filled 2022-05-27: qty 2

## 2022-05-27 MED ORDER — HYDROMORPHONE HCL 1 MG/ML IJ SOLN
0.5000 mg | Freq: Once | INTRAMUSCULAR | Status: AC
  Administered 2022-05-27: 0.5 mg via INTRAVENOUS
  Filled 2022-05-27: qty 0.5

## 2022-05-27 NOTE — ED Notes (Signed)
See triage note  Presents with left hip pain  states pain has become worse  did have an injection which did not last long  now the pain is worse  positive n/v

## 2022-05-27 NOTE — ED Notes (Signed)
Pt states he had imaging done at [facility] today of his left hip.

## 2022-05-27 NOTE — ED Provider Notes (Signed)
[facility] Provider Note    Event Date/Time   First MD Initiated Contact with Patient 05/27/22 [phone]     (approximate)   History   Hip Pain   HPI  [name] is a 61 y.o. male presents to the ED after being seen at [facility] for hip pain.  Patient has been seen in Dr. Yves Dill and had 1 injection of steroids which gave him 3 to 4 days of pain relief.  He was there today hoping to get another injection however he was seen by the nurse practitioner who referred him to the ED for pain control.  Records indicate that they are in the process of ordering an MRI of his hip.  Also an x-ray was done at [facility] today however images or report are not able to be accessed through the ED.  No recent injury.     Physical Exam   Triage Vital Signs: ED Triage Vitals  Enc Vitals Group     BP 05/27/22 0931 (!) 160/94     Pulse Rate 05/27/22 0931 86     Resp 05/27/22 0931 18     Temp 05/27/22 0931 97.8 F (36.6 C)     Temp Source 05/27/22 0931 Oral     SpO2 05/27/22 0931 98 %     Weight 05/27/22 0932 175 lb (79.4 kg)     Height 05/27/22 0932 6' (1.829 m)     Head Circumference --      Peak Flow --      Pain Score 05/27/22 0932 10     Pain Loc --      Pain Edu? --      Excl. in GC? --     Most recent vital signs: Vitals:   05/27/22 0931 05/27/22 1220  BP: (!) 160/94 (!) 150/88  Pulse: 86 80  Resp: 18 16  Temp: 97.8 F (36.6 C)   SpO2: 98% 98%     General: Awake, no distress.  CV:  Good peripheral perfusion.  Resp:  Normal effort.  Abd:  No distention.  Other:  Left hip range of motion is guarded secondary to increased pain however patient is noted to be able to stand and ambulate without any assistance.  No shortening or rotation noted.   ED Results / Procedures / Treatments   Labs (all labs ordered are listed, but only abnormal results are displayed) Labs Reviewed - No data to display    RADIOLOGY  Left hip x-ray images were  reviewed and radiology report is negative for any acute changes.   PROCEDURES:  Critical Care performed:   Procedures   MEDICATIONS ORDERED IN ED: Medications  morphine (PF) 4 MG/ML injection 4 mg (4 mg Intravenous Given 05/27/22 1050)  ondansetron (ZOFRAN) injection 4 mg (4 mg Intravenous Given 05/27/22 1050)  metoCLOPramide (REGLAN) injection 10 mg (10 mg Intravenous Given 05/27/22 1153)  HYDROmorphone (DILAUDID) injection 0.5 mg (0.5 mg Intravenous Given 05/27/22 1153)     IMPRESSION / MDM / ASSESSMENT AND PLAN / ED COURSE  I reviewed the triage vital signs and the nursing notes.   Differential diagnosis includes, but is not limited to, acute on chronic left hip pain, muscle skeletal pain, osteoarthritis.  61 year old male presents to the ED via [facility] with complaint of left hip pain.  Patient has been seen at the pain clinic and has received 1 cortisone injection and was hoping to get another one today.  Patient became nauseous and  began vomiting due to pain and was sent to the ED for pain control.  Images from his x-rays today were not visible to the ED and x-rays of the left hip in the ED were negative for any acute changes.  [facility] is working on orders for an MRI as an outpatient.  Patient initially was given morphine 4 mg IV and Zofran 4 mg IV which gave him minimal relief.  Patient was then given Reglan and Dilaudid 0.5 mg and pain was better controlled.  He states that he does have pain medication and medication for nausea at home.  We discussed taking medication before his pain gets this bad.  Also a referral for orthopedics was written on his discharge papers.      Patient's presentation is most consistent with acute complicated illness / injury requiring diagnostic workup.  FINAL CLINICAL IMPRESSION(S) / ED DIAGNOSES   Final diagnoses:  Right hip pain     Rx / DC Orders   ED Discharge Orders     None        Note:  This document was prepared  using Dragon voice recognition software and [place] include unintentional dictation errors.   Tommi Rumps, PA-C 05/27/22 1504    Merwyn Katos, MD 05/27/22 [phone]

## 2022-05-27 NOTE — ED Triage Notes (Signed)
Pt here from [place] with left hip pain. Pt was there today for a follow up from a a hip injection done a month ago. Pt states it helped for 3 days then the pain returned. Pt now having N/V from the pain. Pt states he needs a hip replacement.

## 2022-05-27 NOTE — Discharge Instructions (Signed)
Follow-up with your regular doctor as needed and also call about scheduling the MRI.  Dr. Rosita Kea who is on-call for orthopedics for [facility]'s is also listed on your discharge papers if you prefer to call and make a follow-up an appointment with him to evaluate your hip pain.  Continue with your regular medications at home.

## 2022-05-31 ENCOUNTER — Other Ambulatory Visit: Payer: Self-pay | Admitting: Family Medicine

## 2022-05-31 ENCOUNTER — Other Ambulatory Visit: Payer: Self-pay | Admitting: Physical Medicine and Rehabilitation

## 2022-05-31 DIAGNOSIS — M461 Sacroiliitis, not elsewhere classified: Secondary | ICD-10-CM

## 2022-06-01 ENCOUNTER — Ambulatory Visit
Admission: RE | Admit: 2022-06-01 | Discharge: 2022-06-01 | Disposition: A | Discharge status: Home / Self Care | Payer: Medicare Other | Source: Ambulatory Visit | Attending: Family Medicine | Admitting: Family Medicine

## 2022-06-01 DIAGNOSIS — M461 Sacroiliitis, not elsewhere classified: Secondary | ICD-10-CM | POA: Diagnosis not present

## 2022-06-01 DIAGNOSIS — M79652 Pain in left thigh: Secondary | ICD-10-CM | POA: Diagnosis not present

## 2022-06-01 DIAGNOSIS — S73192A Other sprain of left hip, initial encounter: Secondary | ICD-10-CM | POA: Diagnosis not present

## 2022-06-08 DIAGNOSIS — M25552 Pain in left hip: Secondary | ICD-10-CM | POA: Diagnosis not present

## 2022-06-08 DIAGNOSIS — Z125 Encounter for screening for malignant neoplasm of prostate: Secondary | ICD-10-CM | POA: Diagnosis not present

## 2022-06-28 DIAGNOSIS — R1031 Right lower quadrant pain: Secondary | ICD-10-CM | POA: Diagnosis not present

## 2022-07-20 ENCOUNTER — Ambulatory Visit: Payer: Medicare Other | Admitting: Sports Medicine

## 2022-07-20 ENCOUNTER — Ambulatory Visit: Payer: Self-pay

## 2022-07-20 ENCOUNTER — Encounter: Payer: Self-pay | Admitting: Orthopaedic Surgery

## 2022-07-20 VITALS — BP 138/84 | Ht 72.0 in | Wt 175.0 lb

## 2022-07-20 DIAGNOSIS — M25552 Pain in left hip: Secondary | ICD-10-CM

## 2022-07-20 NOTE — Progress Notes (Signed)
The patient is someone I am seeing for the first time.  He comes in with chronic left hip pain and actually walks with a limp and is holding his hip and groin area.  He is actually seen my brother before who is performed hernia surgery on the right side.  The patient is from the [place] area and was told that he needed a left hip replacement.  I was actually able to review his plain films and a recent MRI of his left hip.  He says the pain is severe and is daily.  It hurts with walking more so but it is painful mainly all the time.  If he moves his hip a certain way he gets a lot of pain around his groin.  The general surgeons did not feel like this was a hernia on that side.  On my exam he has a lot of pain out of proportion of exam but no blocks to rotation in terms of rotating the left hip but it is painful for him.  Reviewed x-rays from earlier this year of his pelvis and left hip and there is reall only minimal joint space narrowing.  The femoral head on the left side is nice and congruent and there is good space between the weightbearing surface of the acetabulum and the femoral head.  I did review the MRI of his left hip and there is no effusion.  There is suspicion for a small labral tear.  There is no cortical irregularities on the femoral head or acetabulum and there is no edema in the acetabulum femoral head.  Even the surrounding muscles and tendons appear normal.  From my standpoint, he does not need a hip replacement based on no cartilage irregularities that I can see on his left hip x-rays or MRI.  I would like to send him to Dr. Rolena Infante for an ultrasound assessment of left hip to see from an ultrasound standpoint if there is a labral tear and to provide hopefully therapeutic intra-articular injection in the hip joint with steroid.  I would also like to send him to my partner Dr. Sammuel Hines to evaluate the left hip from his standpoint to determine whether or not an arthroscopic intervention would  be warranted.  From my standpoint, it does not look like he needs a hip replacement for his left hip.  The patient agrees with this treatment plan.

## 2022-07-20 NOTE — Addendum Note (Signed)
Addended by: Rudy Jew C on: 07/20/2022 10:08 AM   Modules accepted: Orders

## 2022-07-20 NOTE — Progress Notes (Signed)
   Procedure Note  Patient: [name]             Date of Birth: [birth year]           MRN: [id number]             Visit Date: 07/20/2022  Procedures: Visit Diagnoses:  1. Pain of left hip    Procedure: US-guided intra-articular hip injection, left After discussion on risks/benefits/indications and informed verbal consent was obtained, a timeout was performed. Patient was lying supine on exam table. The hip was cleaned with betadine and alcohol swabs. Then utilizing ultrasound guidance, the patient's femoral head and neck junction was identified and subsequently injected with 4:2 lidocaine:depomedrol via an in-plane approach with ultrasound visualization of the injectate administered into the hip joint. Patient tolerated procedure well without immediate complications.  - I evaluated the patient about 10 minutes post-injection and he did have some improvement in pain --> stated "severe, sharp pain was taken away." - follow-up with Dr. Ninfa Linden as indicated; I am happy to see them as needed  Elba Barman, DO [place]  This note was dictated using Dragon naturally speaking software and Toure contain errors in syntax, spelling, or content which have not been identified prior to signing this note.

## 2022-08-01 DIAGNOSIS — R1031 Right lower quadrant pain: Secondary | ICD-10-CM | POA: Diagnosis not present

## 2022-08-01 DIAGNOSIS — G8929 Other chronic pain: Secondary | ICD-10-CM | POA: Diagnosis not present

## 2022-08-04 ENCOUNTER — Telehealth: Payer: Self-pay | Admitting: Orthopaedic Surgery

## 2022-08-04 ENCOUNTER — Ambulatory Visit: Payer: Medicare Other | Admitting: Sports Medicine

## 2022-08-04 ENCOUNTER — Other Ambulatory Visit: Payer: Self-pay

## 2022-08-04 DIAGNOSIS — M25552 Pain in left hip: Secondary | ICD-10-CM

## 2022-08-04 NOTE — Telephone Encounter (Signed)
Patient came in for appt today that was scheduled with Rolena Infante but notes from [place] states it was supposed to be with Ninfa Linden, Patient informed me Ninfa Linden stated he was going to send him to another Dr to clean up tear in Hip. Please advise on who patient is supposed to be seeing so I [place] schedule the appt. Please advise

## 2022-08-04 NOTE — Telephone Encounter (Signed)
Please schedule him to see Dr. Sammuel Hines

## 2022-08-04 NOTE — Telephone Encounter (Signed)
I placed a referral in chart

## 2022-08-10 ENCOUNTER — Ambulatory Visit (INDEPENDENT_AMBULATORY_CARE_PROVIDER_SITE_OTHER): Payer: Medicare Other | Admitting: Orthopaedic Surgery

## 2022-08-10 DIAGNOSIS — S73192A Other sprain of left hip, initial encounter: Secondary | ICD-10-CM

## 2022-08-10 DIAGNOSIS — M25552 Pain in left hip: Secondary | ICD-10-CM | POA: Diagnosis not present
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
## 2022-08-26 ENCOUNTER — Encounter: Payer: Self-pay | Admitting: Rehabilitative and Restorative Service Providers"

## 2022-08-26 ENCOUNTER — Ambulatory Visit: Payer: Medicare Other | Admitting: Rehabilitative and Restorative Service Providers"

## 2022-08-26 DIAGNOSIS — R262 Difficulty in walking, not elsewhere classified: Secondary | ICD-10-CM

## 2022-08-26 DIAGNOSIS — M6281 Muscle weakness (generalized): Secondary | ICD-10-CM

## 2022-08-26 DIAGNOSIS — M25552 Pain in left hip: Secondary | ICD-10-CM

## 2022-08-26 DIAGNOSIS — M25652 Stiffness of left hip, not elsewhere classified: Secondary | ICD-10-CM | POA: Diagnosis not present

## 2022-08-30 DIAGNOSIS — R1031 Right lower quadrant pain: Secondary | ICD-10-CM | POA: Diagnosis not present

## 2022-08-30 DIAGNOSIS — G8929 Other chronic pain: Secondary | ICD-10-CM | POA: Diagnosis not present

## 2022-08-31 ENCOUNTER — Encounter: Payer: Self-pay | Admitting: Rehabilitative and Restorative Service Providers"

## 2022-08-31 ENCOUNTER — Ambulatory Visit (INDEPENDENT_AMBULATORY_CARE_PROVIDER_SITE_OTHER): Payer: Medicare Other | Admitting: Rehabilitative and Restorative Service Providers"

## 2022-08-31 DIAGNOSIS — R262 Difficulty in walking, not elsewhere classified: Secondary | ICD-10-CM

## 2022-08-31 DIAGNOSIS — M6281 Muscle weakness (generalized): Secondary | ICD-10-CM

## 2022-08-31 DIAGNOSIS — M25652 Stiffness of left hip, not elsewhere classified: Secondary | ICD-10-CM | POA: Diagnosis not present

## 2022-08-31 DIAGNOSIS — M25552 Pain in left hip: Secondary | ICD-10-CM | POA: Diagnosis not present
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
## 2022-09-06 ENCOUNTER — Encounter: Payer: Medicare Other | Admitting: Physical Therapy

## 2022-09-07 ENCOUNTER — Ambulatory Visit (INDEPENDENT_AMBULATORY_CARE_PROVIDER_SITE_OTHER): Payer: Medicare Other | Admitting: Orthopaedic Surgery

## 2022-09-07 DIAGNOSIS — S73192A Other sprain of left hip, initial encounter: Secondary | ICD-10-CM

## 2022-09-07 DIAGNOSIS — M25552 Pain in left hip: Secondary | ICD-10-CM

## 2022-09-07 MED ORDER — TRIAMCINOLONE ACETONIDE 40 MG/ML IJ SUSP
80.0000 mg | INTRAMUSCULAR | Status: AC | PRN
  Administered 2022-09-07: 80 mg via INTRA_ARTICULAR

## 2022-09-07 MED ORDER — LIDOCAINE HCL 1 % IJ SOLN
4.0000 mL | INTRAMUSCULAR | Status: AC | PRN
  Administered 2022-09-07: 4 mL
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
## 2022-09-08 ENCOUNTER — Encounter: Payer: Medicare Other | Admitting: Rehabilitative and Restorative Service Providers"

## 2022-09-13 ENCOUNTER — Encounter: Payer: Medicare Other | Admitting: Physical Therapy

## 2022-09-15 ENCOUNTER — Encounter: Payer: Medicare Other | Admitting: Rehabilitative and Restorative Service Providers"

## 2022-09-19 ENCOUNTER — Encounter: Payer: Medicare Other | Admitting: Rehabilitative and Restorative Service Providers"

## 2022-09-27 DIAGNOSIS — R1031 Right lower quadrant pain: Secondary | ICD-10-CM | POA: Diagnosis not present

## 2022-09-27 DIAGNOSIS — G8929 Other chronic pain: Secondary | ICD-10-CM | POA: Diagnosis not present

## 2022-10-19 ENCOUNTER — Ambulatory Visit (HOSPITAL_BASED_OUTPATIENT_CLINIC_OR_DEPARTMENT_OTHER): Payer: Self-pay | Admitting: Orthopaedic Surgery

## 2022-10-19 DIAGNOSIS — S73192A Other sprain of left hip, initial encounter: Secondary | ICD-10-CM

## 2022-10-19 NOTE — Progress Notes (Signed)
Surgical Instructions    Your procedure is scheduled on December 28.  Report to [facility] Main Entrance "A" at 10:19 A.M., then check in with the Admitting office.  Call this number if you have problems the morning of surgery:  [phone]   If you have any questions prior to your surgery date call [phone]: Open Monday-Friday 8am-4pm If you experience any cold or flu symptoms such as cough, fever, chills, shortness of breath, etc. between now and your scheduled surgery, please notify us at the above number     Remember:  Do not eat after midnight the night before your surgery  You Antonacci drink clear liquids until 9:19am the morning of your surgery.   Clear liquids allowed are: Water, Non-Citrus Juices (without pulp), Carbonated Beverages, Clear Tea, Black Coffee ONLY (NO MILK, CREAM OR POWDERED CREAMER of any kind), and Gatorade    Take these medicines the morning of surgery with A SIP OF WATER:  gabapentin (NEURONTIN)    As of today, STOP taking any Aspirin (unless otherwise instructed by your surgeon) Aleve, Naproxen, Ibuprofen, Motrin, Advil, Goody's, BC's, all herbal medications, fish oil, and all vitamins.           Do not wear jewelry or makeup. Do not wear lotions, powders, perfumes/cologne or deodorant. Do not shave 48 hours prior to surgery.  Men Holik shave face and neck. Do not bring valuables to the hospital. Do not wear nail polish, gel polish, artificial nails, or any other type of covering on natural nails (fingers and toes) If you have artificial nails or gel coating that need to be removed by a nail salon, please have this removed prior to surgery. Artificial nails or gel coating Rita interfere with anesthesia's ability to adequately monitor your vital signs.  [facility] is not responsible for any belongings or valuables.    Do NOT Smoke (Tobacco/Vaping)  24 hours prior to your procedure  If you use a CPAP at night, you Schwalb bring your mask for your overnight  stay.   Contacts, glasses, hearing aids, dentures or partials [place] not be worn into surgery, please bring cases for these belongings   For patients admitted to the hospital, discharge time will be determined by your treatment team.   Patients discharged the day of surgery will not be allowed to drive home, and someone needs to stay with them for 24 hours.   SURGICAL WAITING ROOM VISITATION Patients having surgery or a procedure Tonne have no more than 2 support people in the waiting area - these visitors Muench rotate.   Children under the age of 72 must have an adult with them who is not the patient. If the patient needs to stay at the hospital during part of their recovery, the visitor guidelines for inpatient rooms apply. Pre-op nurse will coordinate an appropriate time for 1 support person to accompany patient in pre-op.  This support person Fogel not rotate.   Please refer to [email or web address] for the visitor guidelines for Inpatients (after your surgery is over and you are in a regular room).    Special instructions:    Oral Hygiene is also important to reduce your risk of infection.  Remember - BRUSH YOUR TEETH THE MORNING OF SURGERY WITH YOUR REGULAR TOOTHPASTE   [facility]- Preparing For Surgery  Before surgery, you can play an important role. Because skin is not sterile, your skin needs to be as free of germs as possible. You can reduce the number of germs on  your skin by washing with CHG (chlorahexidine gluconate) Soap before surgery.  CHG is an antiseptic cleaner which kills germs and bonds with the skin to continue killing germs even after washing.     Please do not use if you have an allergy to CHG or antibacterial soaps. If your skin becomes reddened/irritated stop using the CHG.  Do not shave (including legs and underarms) for at least 48 hours prior to first CHG shower. It is OK to shave your face.  Please follow these  instructions carefully.     Shower the NIGHT BEFORE SURGERY and the MORNING OF SURGERY with CHG Soap.   If you chose to wash your hair, wash your hair first as usual with your normal shampoo. After you shampoo, rinse your hair and body thoroughly to remove the shampoo.  Then [employer] and genitals (private parts) with your normal soap and rinse thoroughly to remove soap.  After that Use CHG Soap as you would any other liquid soap. You can apply CHG directly to the skin and wash gently with a scrungie or a clean washcloth.   Apply the CHG Soap to your body ONLY FROM THE NECK DOWN.  Do not use on open wounds or open sores. Avoid contact with your eyes, ears, mouth and genitals (private parts). Wash Face and genitals (private parts)  with your normal soap.   Wash thoroughly, paying special attention to the area where your surgery will be performed.  Thoroughly rinse your body with warm water from the neck down.  DO NOT shower/wash with your normal soap after using and rinsing off the CHG Soap.  Pat yourself dry with a CLEAN TOWEL.  Wear CLEAN PAJAMAS to bed the night before surgery  Place CLEAN SHEETS on your bed the night before your surgery  DO NOT SLEEP WITH PETS.   Day of Surgery:  Take a shower with CHG soap. Wear Clean/Comfortable clothing the morning of surgery Do not apply any deodorants/lotions.   Remember to brush your teeth WITH YOUR REGULAR TOOTHPASTE.    If you received a COVID test during your pre-op visit, it is requested that you wear a mask when out in public, stay away from anyone that Mallette not be feeling well, and notify your surgeon if you develop symptoms. If you have been in contact with anyone that has tested positive in the last 10 days, please notify your surgeon.    Please read over the following fact sheets that you were given.

## 2022-10-20 ENCOUNTER — Encounter (HOSPITAL_COMMUNITY): Payer: Self-pay

## 2022-10-20 ENCOUNTER — Encounter (HOSPITAL_COMMUNITY)
Admission: RE | Admit: 2022-10-20 | Discharge: 2022-10-20 | Disposition: A | Discharge status: Home / Self Care | Payer: Medicare Other | Source: Ambulatory Visit | Attending: Orthopaedic Surgery | Admitting: Orthopaedic Surgery

## 2022-10-20 VITALS — BP 117/78 | HR 66 | Temp 97.6°F | Resp 18 | Ht 73.0 in | Wt 175.5 lb

## 2022-10-20 DIAGNOSIS — S73192A Other sprain of left hip, initial encounter: Secondary | ICD-10-CM | POA: Diagnosis not present

## 2022-10-20 DIAGNOSIS — R7989 Other specified abnormal findings of blood chemistry: Secondary | ICD-10-CM | POA: Diagnosis not present

## 2022-10-20 DIAGNOSIS — Z01818 Encounter for other preprocedural examination: Secondary | ICD-10-CM

## 2022-10-20 DIAGNOSIS — Z01812 Encounter for preprocedural laboratory examination: Secondary | ICD-10-CM | POA: Insufficient documentation

## 2022-10-20 LAB — CBC
HCT: 45.4 % (ref 39.0–52.0)
Hemoglobin: 15.4 g/dL (ref 13.0–17.0)
MCH: 34.2 pg — ABNORMAL HIGH (ref 26.0–34.0)
MCHC: 33.9 g/dL (ref 30.0–36.0)
MCV: 100.9 fL — ABNORMAL HIGH (ref 80.0–100.0)
Platelets: 256 10*3/uL (ref 150–400)
RBC: 4.5 MIL/uL (ref 4.22–5.81)
RDW: 12.8 % (ref 11.5–15.5)
WBC: 8.1 10*3/uL (ref 4.0–10.5)
nRBC: 0 % (ref 0.0–0.2)

## 2022-10-20 NOTE — Progress Notes (Addendum)
PCP - Bethann Punches, MD Cardiologist - Denies  PPM/ICD - Denies  Chest x-ray - NI EKG - 11/13/20 Stress Test - 02/29/16 ECHO - 02/28/16 Cardiac Cath - 12/19/05  Sleep Study - Denies  DM - Denies  Blood Thinner Instructions:Denies Aspirin Instructions:Denies  ERAS Protcol -Yes PRE-SURGERY Ensure given   COVID TEST- NI   Anesthesia review: No  Patient denies shortness of breath, fever, cough and chest pain at PAT appointment   All instructions explained to the patient, with a verbal understanding of the material. Patient agrees to go over the instructions while at home for a better understanding.  The opportunity to ask questions was provided.  After speaking to pharmacy and updating medications Flomax and Oxycodone were added to his surgical instructions to take.   Updated patient's arrival time to 0800 and his clear liquid cut off time to 0715.

## 2022-10-27 ENCOUNTER — Other Ambulatory Visit: Payer: Self-pay

## 2022-10-27 ENCOUNTER — Ambulatory Visit (HOSPITAL_COMMUNITY): Payer: Medicare Other | Admitting: Anesthesiology

## 2022-10-27 ENCOUNTER — Encounter (HOSPITAL_COMMUNITY)
Admission: RE | Disposition: A | Discharge status: Home / Self Care | Payer: Self-pay | Source: Home / Self Care | Attending: Orthopaedic Surgery

## 2022-10-27 ENCOUNTER — Ambulatory Visit (HOSPITAL_COMMUNITY): Payer: Medicare Other

## 2022-10-27 ENCOUNTER — Ambulatory Visit (HOSPITAL_BASED_OUTPATIENT_CLINIC_OR_DEPARTMENT_OTHER): Payer: Medicare Other | Admitting: Anesthesiology

## 2022-10-27 ENCOUNTER — Ambulatory Visit (HOSPITAL_COMMUNITY)
Admission: RE | Admit: 2022-10-27 | Discharge: 2022-10-27 | Disposition: A | Discharge status: Home / Self Care | Payer: Medicare Other | Attending: Orthopaedic Surgery | Admitting: Orthopaedic Surgery

## 2022-10-27 DIAGNOSIS — F17210 Nicotine dependence, cigarettes, uncomplicated: Secondary | ICD-10-CM | POA: Insufficient documentation

## 2022-10-27 DIAGNOSIS — S73192A Other sprain of left hip, initial encounter: Secondary | ICD-10-CM

## 2022-10-27 DIAGNOSIS — X58XXXA Exposure to other specified factors, initial encounter: Secondary | ICD-10-CM | POA: Diagnosis not present

## 2022-10-27 DIAGNOSIS — Z471 Aftercare following joint replacement surgery: Secondary | ICD-10-CM | POA: Diagnosis not present

## 2022-10-27 DIAGNOSIS — Z96642 Presence of left artificial hip joint: Secondary | ICD-10-CM | POA: Diagnosis not present

## 2022-10-27 HISTORY — PX: HIP ARTHROSCOPY: SHX668

## 2022-10-27 SURGERY — ARTHROSCOPY HIP
Anesthesia: General | Site: Hip | Laterality: Left

## 2022-10-27 MED ORDER — TRANEXAMIC ACID-NACL 1000-0.7 MG/100ML-% IV SOLN
1000.0000 mg | INTRAVENOUS | Status: AC
  Administered 2022-10-27: 1000 mg via INTRAVENOUS
  Filled 2022-10-27: qty 100

## 2022-10-27 MED ORDER — ACETAMINOPHEN 500 MG PO TABS: 1000.0000 mg | ORAL_TABLET | Freq: Once | ORAL | Status: DC

## 2022-10-27 MED ORDER — KETAMINE HCL 50 MG/5ML IJ SOSY
PREFILLED_SYRINGE | INTRAMUSCULAR | Status: AC
  Filled 2022-10-27: qty 5

## 2022-10-27 MED ORDER — ONDANSETRON HCL 4 MG/2ML IJ SOLN
INTRAMUSCULAR | Status: AC
  Filled 2022-10-27: qty 4

## 2022-10-27 MED ORDER — EPHEDRINE SULFATE (PRESSORS) 50 MG/ML IJ SOLN: INTRAMUSCULAR | Status: DC | PRN

## 2022-10-27 MED ORDER — HYDROMORPHONE HCL 1 MG/ML IJ SOLN
0.2500 mg | INTRAMUSCULAR | Status: DC | PRN
  Administered 2022-10-27 (×2): 0.5 mg via INTRAVENOUS

## 2022-10-27 MED ORDER — PHENYLEPHRINE 80 MCG/ML (10ML) SYRINGE FOR IV PUSH (FOR BLOOD PRESSURE SUPPORT)
PREFILLED_SYRINGE | INTRAVENOUS | Status: DC | PRN
  Administered 2022-10-27: 400 ug via INTRAVENOUS

## 2022-10-27 MED ORDER — FENTANYL CITRATE (PF) 250 MCG/5ML IJ SOLN
INTRAMUSCULAR | Status: AC
  Filled 2022-10-27: qty 5

## 2022-10-27 MED ORDER — FENTANYL CITRATE (PF) 250 MCG/5ML IJ SOLN
INTRAMUSCULAR | Status: DC | PRN
  Administered 2022-10-27: 50 ug via INTRAVENOUS
  Administered 2022-10-27: 100 ug via INTRAVENOUS
  Administered 2022-10-27 (×2): 50 ug via INTRAVENOUS

## 2022-10-27 MED ORDER — MIDAZOLAM HCL 2 MG/2ML IJ SOLN
INTRAMUSCULAR | Status: DC | PRN
  Administered 2022-10-27: 2 mg via INTRAVENOUS

## 2022-10-27 MED ORDER — ACETAMINOPHEN 500 MG PO TABS: 1000.0000 mg | ORAL_TABLET | Freq: Once | ORAL | Status: AC

## 2022-10-27 MED ORDER — ONDANSETRON HCL 4 MG/2ML IJ SOLN
INTRAMUSCULAR | Status: DC | PRN
  Administered 2022-10-27: 4 mg via INTRAVENOUS

## 2022-10-27 MED ORDER — KETAMINE HCL 10 MG/ML IJ SOLN
INTRAMUSCULAR | Status: DC | PRN
  Administered 2022-10-27 (×2): 25 mg via INTRAVENOUS

## 2022-10-27 MED ORDER — SODIUM CHLORIDE 0.9 % IR SOLN
Status: DC | PRN
  Administered 2022-10-27: 6000 mL

## 2022-10-27 MED ORDER — PROPOFOL 1000 MG/100ML IV EMUL
INTRAVENOUS | Status: AC
  Filled 2022-10-27: qty 200

## 2022-10-27 MED ORDER — BUPIVACAINE-EPINEPHRINE (PF) 0.5% -1:200000 IJ SOLN
INTRAMUSCULAR | Status: AC
  Filled 2022-10-27: qty 30

## 2022-10-27 MED ORDER — BUPIVACAINE-EPINEPHRINE 0.5% -1:200000 IJ SOLN
INTRAMUSCULAR | Status: DC | PRN
  Administered 2022-10-27: 30 mL

## 2022-10-27 MED ORDER — AMISULPRIDE (ANTIEMETIC) 5 MG/2ML IV SOLN
INTRAVENOUS | Status: AC
  Filled 2022-10-27: qty 4

## 2022-10-27 MED ORDER — IBUPROFEN 800 MG PO TABS: 800.0000 mg | ORAL_TABLET | Freq: Three times a day (TID) | ORAL | 0 refills | Status: AC

## 2022-10-27 MED ORDER — LIDOCAINE 2% (20 MG/ML) 5 ML SYRINGE
INTRAMUSCULAR | Status: DC | PRN
  Administered 2022-10-27: 80 mg via INTRAVENOUS

## 2022-10-27 MED ORDER — DEXAMETHASONE SODIUM PHOSPHATE 10 MG/ML IJ SOLN
INTRAMUSCULAR | Status: DC | PRN
  Administered 2022-10-27: 5 mg via INTRAVENOUS

## 2022-10-27 MED ORDER — ORAL CARE MOUTH RINSE: 15.0000 mL | Freq: Once | OROMUCOSAL | Status: AC

## 2022-10-27 MED ORDER — OXYCODONE-ACETAMINOPHEN 5-325 MG PO TABS: 1.0000 | ORAL_TABLET | Freq: Two times a day (BID) | ORAL | 0 refills | Status: DC

## 2022-10-27 MED ORDER — EPINEPHRINE PF 1 MG/ML IJ SOLN
INTRAMUSCULAR | Status: AC
  Filled 2022-10-27: qty 1

## 2022-10-27 MED ORDER — PHENYLEPHRINE 80 MCG/ML (10ML) SYRINGE FOR IV PUSH (FOR BLOOD PRESSURE SUPPORT)
PREFILLED_SYRINGE | INTRAVENOUS | Status: AC
  Filled 2022-10-27: qty 10

## 2022-10-27 MED ORDER — VASOPRESSIN 20 UNIT/ML IV SOLN
INTRAVENOUS | Status: AC
  Filled 2022-10-27: qty 1

## 2022-10-27 MED ORDER — HYDROMORPHONE HCL 1 MG/ML IJ SOLN
INTRAMUSCULAR | Status: AC
  Filled 2022-10-27: qty 1

## 2022-10-27 MED ORDER — CHLORHEXIDINE GLUCONATE 0.12 % MT SOLN
15.0000 mL | Freq: Once | OROMUCOSAL | Status: AC
  Administered 2022-10-27: 15 mL via OROMUCOSAL
  Filled 2022-10-27: qty 15

## 2022-10-27 MED ORDER — LACTATED RINGERS IV SOLN: INTRAVENOUS | Status: DC

## 2022-10-27 MED ORDER — GABAPENTIN 300 MG PO CAPS
300.0000 mg | ORAL_CAPSULE | Freq: Once | ORAL | Status: AC
  Administered 2022-10-27: 300 mg via ORAL
  Filled 2022-10-27: qty 1

## 2022-10-27 MED ORDER — ACETAMINOPHEN 500 MG PO TABS
ORAL_TABLET | ORAL | Status: AC
  Administered 2022-10-27: 1000 mg via ORAL
  Filled 2022-10-27: qty 2

## 2022-10-27 MED ORDER — AMISULPRIDE (ANTIEMETIC) 5 MG/2ML IV SOLN
10.0000 mg | Freq: Once | INTRAVENOUS | Status: AC | PRN
  Administered 2022-10-27: 10 mg via INTRAVENOUS

## 2022-10-27 MED ORDER — LIDOCAINE 2% (20 MG/ML) 5 ML SYRINGE
INTRAMUSCULAR | Status: AC
  Filled 2022-10-27: qty 5

## 2022-10-27 MED ORDER — CEFAZOLIN SODIUM-DEXTROSE 2-4 GM/100ML-% IV SOLN
2.0000 g | INTRAVENOUS | Status: AC
  Administered 2022-10-27: 2 g via INTRAVENOUS
  Filled 2022-10-27: qty 100

## 2022-10-27 MED ORDER — VASOPRESSIN 20 UNIT/ML IV SOLN
INTRAVENOUS | Status: DC | PRN
  Administered 2022-10-27 (×3): 2 [IU] via INTRAVENOUS

## 2022-10-27 MED ORDER — PROPOFOL 10 MG/ML IV BOLUS
INTRAVENOUS | Status: DC | PRN
  Administered 2022-10-27: 150 mg via INTRAVENOUS

## 2022-10-27 MED ORDER — ASPIRIN 325 MG PO TBEC: 325.0000 mg | DELAYED_RELEASE_TABLET | Freq: Every day | ORAL | 0 refills | Status: DC

## 2022-10-27 MED ORDER — OXYCODONE HCL 5 MG PO TABS
ORAL_TABLET | ORAL | Status: AC
  Administered 2022-10-27: 10 mg via ORAL
  Filled 2022-10-27: qty 2

## 2022-10-27 MED ORDER — 0.9 % SODIUM CHLORIDE (POUR BTL) OPTIME
TOPICAL | Status: DC | PRN
  Administered 2022-10-27: 1000 mL

## 2022-10-27 MED ORDER — DEXAMETHASONE SODIUM PHOSPHATE 10 MG/ML IJ SOLN
INTRAMUSCULAR | Status: AC
  Filled 2022-10-27: qty 2

## 2022-10-27 MED ORDER — PHENYLEPHRINE HCL-NACL 20-0.9 MG/250ML-% IV SOLN
INTRAVENOUS | Status: DC | PRN
  Administered 2022-10-27: 75 ug/min via INTRAVENOUS

## 2022-10-27 MED ORDER — SUGAMMADEX SODIUM 200 MG/2ML IV SOLN
INTRAVENOUS | Status: DC | PRN
  Administered 2022-10-27: 200 mg via INTRAVENOUS

## 2022-10-27 MED ORDER — PROPOFOL 10 MG/ML IV BOLUS
INTRAVENOUS | Status: AC
  Filled 2022-10-27: qty 20

## 2022-10-27 MED ORDER — EPHEDRINE SULFATE-NACL 50-0.9 MG/10ML-% IV SOSY
PREFILLED_SYRINGE | INTRAVENOUS | Status: DC | PRN
  Administered 2022-10-27: 15 mg via INTRAVENOUS
  Administered 2022-10-27: 5 mg via INTRAVENOUS
  Administered 2022-10-27: 10 mg via INTRAVENOUS

## 2022-10-27 MED ORDER — NEOSTIGMINE METHYLSULFATE 3 MG/3ML IV SOSY
PREFILLED_SYRINGE | INTRAVENOUS | Status: AC
  Filled 2022-10-27: qty 3

## 2022-10-27 MED ORDER — GLYCOPYRROLATE PF 0.2 MG/ML IJ SOSY
PREFILLED_SYRINGE | INTRAMUSCULAR | Status: AC
  Filled 2022-10-27: qty 2

## 2022-10-27 MED ORDER — OXYCODONE HCL 5 MG PO TABS: 10.0000 mg | ORAL_TABLET | Freq: Once | ORAL | Status: AC

## 2022-10-27 MED ORDER — ROCURONIUM BROMIDE 10 MG/ML (PF) SYRINGE
PREFILLED_SYRINGE | INTRAVENOUS | Status: AC
  Filled 2022-10-27: qty 10

## 2022-10-27 MED ORDER — ROCURONIUM BROMIDE 10 MG/ML (PF) SYRINGE
PREFILLED_SYRINGE | INTRAVENOUS | Status: DC | PRN
  Administered 2022-10-27: 60 mg via INTRAVENOUS

## 2022-10-27 MED ORDER — MIDAZOLAM HCL 2 MG/2ML IJ SOLN
INTRAMUSCULAR | Status: AC
  Filled 2022-10-27: qty 2

## 2022-10-27 SURGICAL SUPPLY — 65 items
ANCH SUT .5 CRC TPR CT 40X40 (SUTURE) ×1
ANCH SUT 25D 1.4 FLXINSRTR (Anchor) ×2 IMPLANT
ANCH SUT KNTLS INSRTR FRCTN (Anchor) ×1 IMPLANT
ANCHOR CINCHLOCK KNTLS W/INSRT (Anchor) IMPLANT
ANCHOR SUT 1.4 FLEX (Anchor) IMPLANT
APL PRP STRL LF DISP 70% ISPRP (MISCELLANEOUS) ×1
BAG COUNTER SPONGE SURGICOUNT (BAG) IMPLANT
BAG SPNG CNTER NS LX DISP (BAG)
BIT DRILL CINCHLOCK STR F/ANCH (BIT) IMPLANT
BIT DRILL FLEX NANOTACK (BIT) IMPLANT
BLADE SAMURAI FULL RADIUS CVD (BLADE) IMPLANT
BLADE SURG 11 STRL SS (BLADE) ×1 IMPLANT
BUR ROUND HI FLUTE 8 4X19 (BURR) ×1 IMPLANT
BURR ROUND HI FLUTE 8 4X19 (BURR) ×1
CANNULA OBTURATOR FLOWPORT ST5 (CANNULA) IMPLANT
CHLORAPREP W/TINT 26 (MISCELLANEOUS) ×1 IMPLANT
COOLER ICEMAN CLASSIC (MISCELLANEOUS) IMPLANT
DISSECTOR 4.2MMX19CM HL (MISCELLANEOUS) ×1 IMPLANT
DRAPE C-ARM 42X72 X-RAY (DRAPES) ×1 IMPLANT
DRAPE STERI IOBAN 125X83 (DRAPES) ×1 IMPLANT
DRAPE U-SHAPE 47X51 STRL (DRAPES) ×2 IMPLANT
DRSG TEGADERM 4X4.75 (GAUZE/BANDAGES/DRESSINGS) ×2 IMPLANT
FEE RENTAL EQUIP HIP INSTR KIT (INSTRUMENTS) IMPLANT
GAUZE SPONGE 4X4 12PLY STRL (GAUZE/BANDAGES/DRESSINGS) ×1 IMPLANT
GAUZE XEROFORM 1X8 LF (GAUZE/BANDAGES/DRESSINGS) IMPLANT
GLOVE BIO SURGEON STRL SZ7.5 (GLOVE) ×1 IMPLANT
GLOVE BIOGEL PI IND STRL 6.5 (GLOVE) ×1 IMPLANT
GLOVE BIOGEL PI IND STRL 8 (GLOVE) ×1 IMPLANT
GLOVE ECLIPSE 6.0 STRL STRAW (GLOVE) ×1 IMPLANT
GOWN STRL REUS W/ TWL LRG LVL3 (GOWN DISPOSABLE) ×2 IMPLANT
GOWN STRL REUS W/ TWL XL LVL3 (GOWN DISPOSABLE) ×1 IMPLANT
GOWN STRL REUS W/TWL LRG LVL3 (GOWN DISPOSABLE) ×2
GOWN STRL REUS W/TWL XL LVL3 (GOWN DISPOSABLE) ×1
INSTRUMENT ORTHO TEXT HIP FEM (INSTRUMENTS) IMPLANT
KIT BASIN OR (CUSTOM PROCEDURE TRAY) ×1 IMPLANT
KIT HIP ARTHROSCOPY (ORTHOPEDIC DISPOSABLE SUPPLIES) ×1 IMPLANT
KIT PATIENT POSITION LRG (KITS) IMPLANT
KIT PORTAL ENTRY HIP ACCESS (KITS) IMPLANT
KIT TURNOVER KIT B (KITS) ×1 IMPLANT
MANIFOLD NEPTUNE II (INSTRUMENTS) ×2 IMPLANT
NDL INJECTOR II CARTRIDGE (MISCELLANEOUS) IMPLANT
NDL SPNL 18GX3.5 QUINCKE PK (NEEDLE) ×1 IMPLANT
NEEDLE INJECTOR II CARTRIDGE (MISCELLANEOUS) ×1 IMPLANT
NEEDLE SPNL 18GX3.5 QUINCKE PK (NEEDLE) IMPLANT
PACK BASIC III (CUSTOM PROCEDURE TRAY) ×1
PACK SRG BSC III STRL LF ECLPS (CUSTOM PROCEDURE TRAY) ×1 IMPLANT
PAD ARMBOARD 7.5X6 YLW CONV (MISCELLANEOUS) ×2 IMPLANT
PAD COLD SHLDR WRAP-ON (PAD) IMPLANT
PASSER SUT 1.5D CRESCENT (INSTRUMENTS) IMPLANT
PASSER SUT 70D UP ANGLED (INSTRUMENTS) IMPLANT
SPIKE FLUID TRANSFER (MISCELLANEOUS) ×1 IMPLANT
SPONGE T-LAP 18X18 ~~LOC~~+RFID (SPONGE) ×1 IMPLANT
SUT ETHILON 3 0 PS 1 (SUTURE) IMPLANT
SUT FIBERWIRE #2 38 T-5 BLUE (SUTURE)
SUT XBRAID 1.4 BLUE (SUTURE) IMPLANT
SUTURE FIBERWR #2 38 T-5 BLUE (SUTURE) IMPLANT
SUTURE TAPE XBRAID 1.2 BLUE 45 (SUTURE) IMPLANT
SUTURETAPE XBRAID 1.2 BLUE 45 (SUTURE) ×1
SYR 50ML LL SCALE MARK (SYRINGE) ×1 IMPLANT
TOWEL GREEN STERILE (TOWEL DISPOSABLE) ×1 IMPLANT
TRAY ARTHROSCOPY HIP STRE FEE (INSTRUMENTS) ×1 IMPLANT
TRAY PIVOT PORT STRE FEE (INSTRUMENTS) ×1 IMPLANT
TUBE CONNECTING 12X1/4 (SUCTIONS) ×2 IMPLANT
TUBING ARTHROSCOPY IRRIG 16FT (MISCELLANEOUS) ×1 IMPLANT
WATER STERILE IRR 1000ML POUR (IV SOLUTION) ×1 IMPLANT

## 2022-10-27 NOTE — Anesthesia Procedure Notes (Signed)
Procedure Name: Intubation Date/Time: 10/27/2022 10:52 AM  Performed by: Elvin So, CRNAPre-anesthesia Checklist: Patient identified, Emergency Drugs available, Suction available and Patient being monitored Patient Re-evaluated:Patient Re-evaluated prior to induction Oxygen Delivery Method: Circle System Utilized Preoxygenation: Pre-oxygenation with 100% oxygen Induction Type: IV induction Ventilation: Mask ventilation without difficulty Laryngoscope Size: Mac and 4 Grade View: Grade IV Tube type: Oral Tube size: 7.5 mm Number of attempts: 1 Airway Equipment and Method: Stylet and Oral airway Placement Confirmation: ETT inserted through vocal cords under direct vision, positive ETCO2 and breath sounds checked- equal and bilateral Secured at: 22 cm Tube secured with: Tape Dental Injury: Teeth and Oropharynx as per pre-operative assessment

## 2022-10-27 NOTE — Anesthesia Preprocedure Evaluation (Signed)
Anesthesia Evaluation  Patient identified by MRN, date of birth, ID band Patient awake    Reviewed: Allergy & Precautions, NPO status , Patient's Chart, lab work & pertinent test results  History of Anesthesia Complications (+) PONV and history of anesthetic complications  Airway Mallampati: III  TM Distance: >3 FB Neck ROM: Full    Dental  (+) Dental Advisory Given   Pulmonary Current Smoker   breath sounds clear to auscultation       Cardiovascular negative cardio ROS  Rhythm:Regular Rate:Normal     Neuro/Psych negative neurological ROS     GI/Hepatic negative GI ROS, Neg liver ROS,,,  Endo/Other  negative endocrine ROS    Renal/GU negative Renal ROS     Musculoskeletal   Abdominal   Peds  Hematology negative hematology ROS (+)   Anesthesia Other Findings   Reproductive/Obstetrics                             Anesthesia Physical Anesthesia Plan  ASA: 2  Anesthesia Plan: General   Post-op Pain Management: Tylenol PO (pre-op)*, Gabapentin PO (pre-op)* and Ketamine IV*   Induction: Intravenous  PONV Risk Score and Plan: 2 and Dexamethasone, Ondansetron and Treatment Flood vary due to age or medical condition  Airway Management Planned: Oral ETT  Additional Equipment:   Intra-op Plan:   Post-operative Plan: Extubation in OR  Informed Consent: I have reviewed the patients History and Physical, chart, labs and discussed the procedure including the risks, benefits and alternatives for the proposed anesthesia with the patient or authorized representative who has indicated his/her understanding and acceptance.       Plan Discussed with: CRNA  Anesthesia Plan Comments:        Anesthesia Quick Evaluation

## 2022-10-27 NOTE — Interval H&P Note (Signed)
History and Physical Interval Note:  10/27/2022 10:03 AM  [name]  has presented today for surgery, with the diagnosis of LEFT HIP LABRAL TEAR.  The various methods of treatment have been discussed with the patient and family. After consideration of risks, benefits and other options for treatment, the patient has consented to  Procedure(s): LEFT ARTHROSCOPY HIP WITH LABRAL REPAIR (Left) as a surgical intervention.  The patient's history has been reviewed, patient examined, no change in status, stable for surgery.  I have reviewed the patient's chart and labs.  Questions were answered to the patient's satisfaction.     Huel Cote

## 2022-10-27 NOTE — Discharge Instructions (Signed)
     Discharge Instructions    Attending Surgeon: Huel Cote, MD Office Phone Number: [phone]   Diagnosis and Procedures:    Surgeries Performed: Left hip labral repair  Discharge Plan:    Diet: Resume usual diet. Begin with light or bland foods.  Drink plenty of fluids.  Activity:  Touchdown weight bearing left leg for 2 weeks with brace. You are advised to go home directly from the hospital or surgical center. Restrict your activities.  GENERAL INSTRUCTIONS: 1.  Please apply ice to your wound to help with swelling and inflammation. This will improve your comfort and your overall recovery following surgery.     2. Please call Dr. Serena Croissant office at [phone] with questions Monday-Friday during business hours. If no one answers, please leave a message and someone should get back to the patient within 24 hours. For emergencies please call 911 or proceed to the emergency room.   3. Patient to notify surgical team if experiences any of the following: Bowel/Bladder dysfunction, uncontrolled pain, nerve/muscle weakness, incision with increased drainage or redness, nausea/vomiting and Fever greater than 101.0 F.  Be alert for signs of infection including redness, streaking, odor, fever or chills. Be alert for excessive pain or bleeding and notify your surgeon immediately.  WOUND INSTRUCTIONS:   Leave your dressing, cast, or splint in place until your post operative visit.  Keep it clean and dry.  Always keep the incision clean and dry until the staples/sutures are removed. If there is no drainage from the incision you should keep it open to air. If there is drainage from the incision you must keep it covered at all times until the drainage stops  Do not soak in a bath tub, hot tub, pool, lake or other body of water until 21 days after your surgery and your incision is completely dry and healed.  If you have removable sutures (or staples) they must be removed 10-14 days  (unless otherwise instructed) from the day of your surgery.     1)  Elevate the extremity as much as possible.  2)  Keep the dressing clean and dry.  3)  Please call us if the dressing becomes wet or dirty.  4)  If you are experiencing worsening pain or worsening swelling, please call.     MEDICATIONS: Resume all previous home medications at the previous prescribed dose and frequency unless otherwise noted Start taking the  pain medications on an as-needed basis as prescribed  Please taper down pain medication over the next week following surgery.  Ideally you should not require a refill of any narcotic pain medication.  Take pain medication with food to minimize nausea. In addition to the prescribed pain medication, you Simonich take over-the-counter pain relievers such as Tylenol.  Do NOT take additional tylenol if your pain medication already has tylenol in it.  Aspirin 325mg  daily for four weeks.      FOLLOWUP INSTRUCTIONS: 1. Follow up at the Physical Therapy Clinic 3-4 days following surgery. This appointment should be scheduled unless other arrangements have been made.The Physical Therapy scheduling number is [phone] if an appointment has not already been arranged.  2. Contact Dr. [phone] office during office hours at [phone] or the practice after hours line at [phone] for non-emergencies. For medical emergencies call 911.   Discharge Location: Home

## 2022-10-27 NOTE — Progress Notes (Signed)
Orthopedic Tech Progress Note Patient Details:  [name] November 27, 1960 [id number]  Ortho Devices Type of Ortho Device: Crutches Ortho Device/Splint Interventions: Ordered, Application, Adjustment   Post Interventions Patient Tolerated: Well Instructions Provided: Poper ambulation with device  [name] A [name] 10/27/2022, 3:18 PM

## 2022-10-27 NOTE — Brief Op Note (Signed)
   Brief Op Note  Date of Surgery: 10/27/2022  Preoperative Diagnosis: LEFT HIP LABRAL TEAR  Postoperative Diagnosis: same  Procedure: Procedure(s): LEFT ARTHROSCOPY HIP WITH LABRAL REPAIR  Implants: Implant Name Type Inv. Item Serial No. Manufacturer Lot No. LRB No. Used Action  ANCHOR CINCHLOCK KNTLS W/INSRT - [id number] Anchor ANCHOR CINCHLOCK KNTLS W/INSRT  [employer] [id number] Left 1 Implanted  ANCHOR SUT 1.4 FLEX - [id number] Anchor ANCHOR SUT 1.4 FLEX  [employer] [id number] Left 2 Implanted    Surgeons: Surgeon(s): Huel Cote, MD  Anesthesia: General    Estimated Blood Loss: See anesthesia record  Complications: None  Condition to PACU: Stable  Benancio Deeds, MD 10/27/2022 12:38 PM

## 2022-10-27 NOTE — Op Note (Signed)
Date of Surgery: 10/27/2022  INDICATIONS: [name] is a 61 y.o.-year-old male with with left hip labral tear which is failed conservative management.  The risk and benefits of the procedure were discussed in detail and documented in the pre-operative evaluation.   PREOPERATIVE DIAGNOSIS: 1.  Left hip labral tear  POSTOPERATIVE DIAGNOSIS: Same.  PROCEDURE: 1.  Left hip labral repair  SURGEON: Benancio Deeds MD  ASSISTANT: Kerby Less, ATC  ANESTHESIA:  general  IV FLUIDS AND URINE: See anesthesia record.  ANTIBIOTICS: Ancef  ESTIMATED BLOOD LOSS: 10 mL.  IMPLANTS:  Implant Name Type Inv. Item Serial No. Manufacturer Lot No. LRB No. Used Action  ANCHOR CINCHLOCK KNTLS W/INSRT - [id number] Anchor ANCHOR CINCHLOCK KNTLS W/INSRT  [employer] [id number] Left 1 Implanted  ANCHOR SUT 1.4 FLEX - [id number] Anchor ANCHOR SUT 1.4 FLEX  [employer] [id number] Left 2 Implanted    DRAINS: None  CULTURES: None  COMPLICATIONS: none  DESCRIPTION OF PROCEDURE:  Cartilage Intact femoral and acetabular cartilage with positive Rug sign at the 1 o'clock position   Labrum Tearing as described below appearing   Boundaries of labral tear Convention (3 o'clock anterior, 9 o'clock posterior) Anterior boundary: 3 o'clock Posterior boundary: 12 o'clock   OPERATIVE REPORT:  The patient was brought to the operating room, placed supine on the operating table, and bony prominences were padded.  The traction boots were applied with padding to ensure that safe traction could be applied through the feet.  The contralateral limb was abducted maximally and light traction was applied.  The operative leg was brought into neutral position.  The flouroscopic c-arm was brought between the legs for an AP image.  The patient was prepped and draped in a sterile fashion.  Time-out was performed and landmarks were identified. Traction was obtained and care was taken to ensure the least amount of force  necessary to allow safe access to the joint of 8-65mm.  This was checked with fluoroscopy.    Next we placed an anterolateral portal under the assistance of fluoroscopy.  First, fluoroscopy was used to estimate the trajectory and starting point.  A 58mm incision with a #11 blade was made and a straight hemostat was used to dilate the portal through the appropriate tract.  We then placed a 14-gauge hypodermic needle with careful technique to be as close to the femoral head as possible and parallel to the sorcele to ensure no iatrogenic damage to the labrum.  This released the negative pressure environment and the amount of traction was adjusted to maintain the 8-53mm of distraction.  A nitinol wire was placed through the needle and flouroscopy was used to ensure it extended to the medial wall of the acetabulum.  The Flowport from [employer] was placed over the wire and the nitinol wire was retracted to just inside the capsule during insertion of the dilator and cannula to minimize the risk of breakage. The arthroscope was placed next and we visualized the anterior triangle.     We then placed the anterior portal under direct visualization using the technique described above.  This was safely placed as well without damage to the labrum or femoral head.  We then switched our arthroscope to the anterior portal to ensure we were not through the labrum - we were safely through the capsule only.  We then proceeded with a transverse capsulotomy connecting the 2 portals in the same plane utilizing the Samurai blade from [employer].  We identified the  anterior inferior iliac spine proximally, the psoas tendon medially and the rectus tendon laterally as landmarks.  We then proceeded with a diagnostic arthroscopy - the results can be found in the findings section above.    We then used the 50 degree hip specific radiofrequency device from [employer]. and a 22mm shaver to clear the superior acetabulum and  expose the subspinous region.  Next we exposed the acetabular rim leaving the chondral labral junction intact.  Working from both portals, the acetabular rim/subspinous region was reshaped with 5.5 mm bur consistent with the preoperative three-dimensional imaging.   When adequate reshaping was obtained we then proceeded with the labral repair. We placed 3 anchors at the 12:00, 1:00, and 2:30 positions. The sutures were passed using the crescent Nanopass from [employer].  This resulted in anatomic labral repair.  We debrided the loose cartilage at the rim and residual degenerative labral tissue.  Traction was let down with total traction time of 55 minutes.     Finally, we performed a complete capsular closure with tape suture.  She was replaced in the anterior and posterior limb of the reported capsulotomy with excellent apposition. We then removed the arthroscope and closed the incisions with 3-0 nylon simple stitches.  A sterile dressing was applied..  The patient was awakened from anesthesia and transferred to PACU in stable condition. Postoperative care includes:       POSTOPERATIVE PLAN:    2 weeks touchdown weight bearing, hip labral repair protocol Formal physical therapy will begin this week. ASA 325 Daily for DVT prophylaxis     Benancio Deeds, MD 12:51 PM

## 2022-10-27 NOTE — Transfer of Care (Signed)
Immediate Anesthesia Transfer of Care Note  Patient: Avian Konigsberg Atwood  Procedure(s) Performed: LEFT ARTHROSCOPY HIP WITH LABRAL REPAIR (Left: Hip)  Patient Location: PACU  Anesthesia Type:General  Level of Consciousness: awake and patient cooperative  Airway & Oxygen Therapy: Patient Spontanous Breathing and Patient connected to face mask oxygen  Post-op Assessment: Report given to RN, Post -op Vital signs reviewed and stable, and Patient moving all extremities  Post vital signs: Reviewed and stable  Last Vitals:  Vitals Value Taken Time  BP 122/73 10/27/22 1248  Temp    Pulse 86 10/27/22 1251  Resp 15 10/27/22 1251  SpO2 98 % 10/27/22 1251  Vitals shown include unvalidated device data.  Last Pain:  Vitals:   10/27/22 0903  TempSrc:   PainSc: 7          Complications: No notable events documented.
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
## 2022-10-28 ENCOUNTER — Encounter (HOSPITAL_COMMUNITY): Payer: Self-pay | Admitting: Orthopaedic Surgery

## 2022-10-28 NOTE — Anesthesia Postprocedure Evaluation (Signed)
Anesthesia Post Note  Patient: [name]  Procedure(s) Performed: LEFT ARTHROSCOPY HIP WITH LABRAL REPAIR (Left: Hip)     Patient location during evaluation: PACU Anesthesia Type: General Level of consciousness: awake and alert Pain management: pain level controlled Vital Signs Assessment: post-procedure vital signs reviewed and stable Respiratory status: spontaneous breathing, nonlabored ventilation, respiratory function stable and patient connected to nasal cannula oxygen Cardiovascular status: blood pressure returned to baseline and stable Postop Assessment: no apparent nausea or vomiting Anesthetic complications: no   No notable events documented.  Last Vitals:  Vitals:   10/27/22 1415 10/27/22 1430  BP: 123/82 133/88  Pulse: 67 76  Resp: 14 15  Temp:  36.5 C  SpO2: 95% 96%    Last Pain:  Vitals:   10/27/22 1415  TempSrc:   PainSc: 8                  Kennieth Rad

## 2022-11-01 ENCOUNTER — Ambulatory Visit (HOSPITAL_BASED_OUTPATIENT_CLINIC_OR_DEPARTMENT_OTHER): Payer: Medicare Other | Attending: Orthopaedic Surgery | Admitting: Physical Therapy

## 2022-11-01 ENCOUNTER — Other Ambulatory Visit: Payer: Self-pay

## 2022-11-01 ENCOUNTER — Encounter (HOSPITAL_BASED_OUTPATIENT_CLINIC_OR_DEPARTMENT_OTHER): Payer: Self-pay | Admitting: Physical Therapy

## 2022-11-01 DIAGNOSIS — M6281 Muscle weakness (generalized): Secondary | ICD-10-CM | POA: Diagnosis not present

## 2022-11-01 DIAGNOSIS — S73192A Other sprain of left hip, initial encounter: Secondary | ICD-10-CM | POA: Insufficient documentation

## 2022-11-01 DIAGNOSIS — M25552 Pain in left hip: Secondary | ICD-10-CM | POA: Insufficient documentation

## 2022-11-01 DIAGNOSIS — M25652 Stiffness of left hip, not elsewhere classified: Secondary | ICD-10-CM

## 2022-11-01 DIAGNOSIS — R262 Difficulty in walking, not elsewhere classified: Secondary | ICD-10-CM | POA: Diagnosis not present

## 2022-11-01 DIAGNOSIS — X58XXXA Exposure to other specified factors, initial encounter: Secondary | ICD-10-CM | POA: Insufficient documentation
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
## 2022-11-09 ENCOUNTER — Ambulatory Visit (HOSPITAL_BASED_OUTPATIENT_CLINIC_OR_DEPARTMENT_OTHER): Payer: Medicare Other | Admitting: Physical Therapy

## 2022-11-09 ENCOUNTER — Other Ambulatory Visit (HOSPITAL_BASED_OUTPATIENT_CLINIC_OR_DEPARTMENT_OTHER): Payer: Self-pay

## 2022-11-09 ENCOUNTER — Ambulatory Visit (INDEPENDENT_AMBULATORY_CARE_PROVIDER_SITE_OTHER): Payer: Medicare Other | Admitting: Orthopaedic Surgery

## 2022-11-09 ENCOUNTER — Encounter (HOSPITAL_BASED_OUTPATIENT_CLINIC_OR_DEPARTMENT_OTHER): Payer: Self-pay | Admitting: Physical Therapy

## 2022-11-09 DIAGNOSIS — M25552 Pain in left hip: Secondary | ICD-10-CM

## 2022-11-09 DIAGNOSIS — R262 Difficulty in walking, not elsewhere classified: Secondary | ICD-10-CM | POA: Diagnosis not present

## 2022-11-09 DIAGNOSIS — M6281 Muscle weakness (generalized): Secondary | ICD-10-CM | POA: Diagnosis not present

## 2022-11-09 DIAGNOSIS — M25652 Stiffness of left hip, not elsewhere classified: Secondary | ICD-10-CM | POA: Diagnosis not present

## 2022-11-09 DIAGNOSIS — X58XXXA Exposure to other specified factors, initial encounter: Secondary | ICD-10-CM | POA: Diagnosis not present

## 2022-11-09 DIAGNOSIS — S73192A Other sprain of left hip, initial encounter: Secondary | ICD-10-CM | POA: Diagnosis not present

## 2022-11-09 MED ORDER — MELOXICAM 15 MG PO TABS
15.0000 mg | ORAL_TABLET | Freq: Every day | ORAL | 0 refills | Status: DC
  Filled 2022-11-09: qty 7, 7d supply, fill #0

## 2022-11-09 MED ORDER — OXYCODONE HCL 5 MG PO TABS
5.0000 mg | ORAL_TABLET | ORAL | 0 refills | Status: DC | PRN
  Filled 2022-11-09: qty 10, 2d supply, fill #0
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
## 2022-11-10 ENCOUNTER — Encounter (HOSPITAL_BASED_OUTPATIENT_CLINIC_OR_DEPARTMENT_OTHER): Payer: Medicare Other | Admitting: Orthopaedic Surgery

## 2022-11-11 DIAGNOSIS — G8929 Other chronic pain: Secondary | ICD-10-CM | POA: Diagnosis not present

## 2022-11-11 DIAGNOSIS — R1031 Right lower quadrant pain: Secondary | ICD-10-CM | POA: Diagnosis not present

## 2022-11-16 ENCOUNTER — Ambulatory Visit (HOSPITAL_BASED_OUTPATIENT_CLINIC_OR_DEPARTMENT_OTHER): Payer: Medicare Other | Admitting: Physical Therapy

## 2022-11-16 ENCOUNTER — Encounter (HOSPITAL_BASED_OUTPATIENT_CLINIC_OR_DEPARTMENT_OTHER): Payer: Self-pay | Admitting: Physical Therapy

## 2022-11-16 DIAGNOSIS — M6281 Muscle weakness (generalized): Secondary | ICD-10-CM | POA: Diagnosis not present

## 2022-11-16 DIAGNOSIS — S73192A Other sprain of left hip, initial encounter: Secondary | ICD-10-CM | POA: Diagnosis not present

## 2022-11-16 DIAGNOSIS — M25652 Stiffness of left hip, not elsewhere classified: Secondary | ICD-10-CM

## 2022-11-16 DIAGNOSIS — R262 Difficulty in walking, not elsewhere classified: Secondary | ICD-10-CM

## 2022-11-16 DIAGNOSIS — M25552 Pain in left hip: Secondary | ICD-10-CM | POA: Diagnosis not present

## 2022-11-16 DIAGNOSIS — X58XXXA Exposure to other specified factors, initial encounter: Secondary | ICD-10-CM | POA: Diagnosis not present
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
## 2022-11-19 ENCOUNTER — Other Ambulatory Visit (HOSPITAL_BASED_OUTPATIENT_CLINIC_OR_DEPARTMENT_OTHER): Payer: Self-pay | Admitting: Orthopaedic Surgery

## 2022-11-23 ENCOUNTER — Encounter (HOSPITAL_BASED_OUTPATIENT_CLINIC_OR_DEPARTMENT_OTHER): Payer: Medicare Other | Admitting: Physical Therapy

## 2022-11-29 ENCOUNTER — Ambulatory Visit (HOSPITAL_BASED_OUTPATIENT_CLINIC_OR_DEPARTMENT_OTHER): Payer: Medicare Other | Admitting: Physical Therapy

## 2022-12-01 ENCOUNTER — Ambulatory Visit (HOSPITAL_BASED_OUTPATIENT_CLINIC_OR_DEPARTMENT_OTHER): Payer: Medicare Other | Admitting: Physical Therapy

## 2022-12-06 ENCOUNTER — Ambulatory Visit (HOSPITAL_BASED_OUTPATIENT_CLINIC_OR_DEPARTMENT_OTHER): Payer: Medicare Other | Admitting: Physical Therapy

## 2022-12-08 ENCOUNTER — Encounter (HOSPITAL_BASED_OUTPATIENT_CLINIC_OR_DEPARTMENT_OTHER): Payer: Medicare Other | Admitting: Physical Therapy

## 2022-12-09 DIAGNOSIS — R1031 Right lower quadrant pain: Secondary | ICD-10-CM | POA: Diagnosis not present

## 2022-12-09 DIAGNOSIS — G8929 Other chronic pain: Secondary | ICD-10-CM | POA: Diagnosis not present

## 2022-12-13 ENCOUNTER — Encounter (HOSPITAL_BASED_OUTPATIENT_CLINIC_OR_DEPARTMENT_OTHER): Payer: Medicare Other | Admitting: Physical Therapy

## 2022-12-15 ENCOUNTER — Ambulatory Visit (INDEPENDENT_AMBULATORY_CARE_PROVIDER_SITE_OTHER): Payer: Medicare Other | Admitting: Orthopaedic Surgery

## 2022-12-15 ENCOUNTER — Encounter (HOSPITAL_BASED_OUTPATIENT_CLINIC_OR_DEPARTMENT_OTHER): Payer: Medicare Other | Admitting: Physical Therapy

## 2022-12-15 DIAGNOSIS — S73192A Other sprain of left hip, initial encounter: Secondary | ICD-10-CM
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
## 2022-12-20 ENCOUNTER — Encounter (HOSPITAL_BASED_OUTPATIENT_CLINIC_OR_DEPARTMENT_OTHER): Payer: Medicare Other | Admitting: Physical Therapy

## 2022-12-22 ENCOUNTER — Encounter (HOSPITAL_BASED_OUTPATIENT_CLINIC_OR_DEPARTMENT_OTHER): Payer: Medicare Other | Admitting: Physical Therapy

## 2022-12-27 ENCOUNTER — Encounter (HOSPITAL_BASED_OUTPATIENT_CLINIC_OR_DEPARTMENT_OTHER): Payer: Medicare Other | Admitting: Physical Therapy

## 2022-12-29 ENCOUNTER — Encounter (HOSPITAL_BASED_OUTPATIENT_CLINIC_OR_DEPARTMENT_OTHER): Payer: Medicare Other | Admitting: Physical Therapy

## 2022-12-30 DIAGNOSIS — G8929 Other chronic pain: Secondary | ICD-10-CM | POA: Diagnosis not present

## 2022-12-30 DIAGNOSIS — R1031 Right lower quadrant pain: Secondary | ICD-10-CM | POA: Diagnosis not present

## 2023-01-03 ENCOUNTER — Encounter (HOSPITAL_BASED_OUTPATIENT_CLINIC_OR_DEPARTMENT_OTHER): Payer: Medicare Other | Admitting: Physical Therapy

## 2023-01-05 ENCOUNTER — Encounter (HOSPITAL_BASED_OUTPATIENT_CLINIC_OR_DEPARTMENT_OTHER): Payer: Medicare Other | Admitting: Physical Therapy

## 2023-01-10 ENCOUNTER — Encounter (HOSPITAL_BASED_OUTPATIENT_CLINIC_OR_DEPARTMENT_OTHER): Payer: Medicare Other | Admitting: Physical Therapy

## 2023-01-10 DIAGNOSIS — E782 Mixed hyperlipidemia: Secondary | ICD-10-CM | POA: Diagnosis not present

## 2023-01-10 DIAGNOSIS — Z125 Encounter for screening for malignant neoplasm of prostate: Secondary | ICD-10-CM | POA: Diagnosis not present

## 2023-01-12 ENCOUNTER — Encounter (HOSPITAL_BASED_OUTPATIENT_CLINIC_OR_DEPARTMENT_OTHER): Payer: Medicare Other | Admitting: Physical Therapy

## 2023-01-17 ENCOUNTER — Encounter (HOSPITAL_BASED_OUTPATIENT_CLINIC_OR_DEPARTMENT_OTHER): Payer: Medicare Other | Admitting: Physical Therapy

## 2023-01-17 DIAGNOSIS — Z0000 Encounter for general adult medical examination without abnormal findings: Secondary | ICD-10-CM | POA: Diagnosis not present

## 2023-01-17 DIAGNOSIS — Z125 Encounter for screening for malignant neoplasm of prostate: Secondary | ICD-10-CM | POA: Diagnosis not present

## 2023-01-17 DIAGNOSIS — J449 Chronic obstructive pulmonary disease, unspecified: Secondary | ICD-10-CM | POA: Diagnosis not present

## 2023-01-17 DIAGNOSIS — I700 Atherosclerosis of aorta: Secondary | ICD-10-CM | POA: Diagnosis not present

## 2023-01-19 ENCOUNTER — Encounter (HOSPITAL_BASED_OUTPATIENT_CLINIC_OR_DEPARTMENT_OTHER): Payer: Medicare Other | Admitting: Physical Therapy

## 2023-01-24 ENCOUNTER — Encounter (HOSPITAL_BASED_OUTPATIENT_CLINIC_OR_DEPARTMENT_OTHER): Payer: Medicare Other | Admitting: Physical Therapy

## 2023-01-26 ENCOUNTER — Encounter (HOSPITAL_BASED_OUTPATIENT_CLINIC_OR_DEPARTMENT_OTHER): Payer: Medicare Other | Admitting: Physical Therapy

## 2023-01-31 ENCOUNTER — Encounter (HOSPITAL_BASED_OUTPATIENT_CLINIC_OR_DEPARTMENT_OTHER): Payer: Medicare Other | Admitting: Physical Therapy

## 2023-02-02 ENCOUNTER — Encounter (HOSPITAL_BASED_OUTPATIENT_CLINIC_OR_DEPARTMENT_OTHER): Payer: Medicare Other | Admitting: Physical Therapy

## 2023-02-03 DIAGNOSIS — G8929 Other chronic pain: Secondary | ICD-10-CM | POA: Diagnosis not present

## 2023-02-03 DIAGNOSIS — R1031 Right lower quadrant pain: Secondary | ICD-10-CM | POA: Diagnosis not present

## 2023-02-07 ENCOUNTER — Encounter (HOSPITAL_BASED_OUTPATIENT_CLINIC_OR_DEPARTMENT_OTHER): Payer: Medicare Other | Admitting: Physical Therapy

## 2023-02-09 ENCOUNTER — Encounter (HOSPITAL_BASED_OUTPATIENT_CLINIC_OR_DEPARTMENT_OTHER): Payer: Medicare Other | Admitting: Physical Therapy

## 2023-03-03 DIAGNOSIS — R1031 Right lower quadrant pain: Secondary | ICD-10-CM | POA: Diagnosis not present

## 2023-04-07 DIAGNOSIS — G8929 Other chronic pain: Secondary | ICD-10-CM | POA: Diagnosis not present

## 2023-04-07 DIAGNOSIS — R1031 Right lower quadrant pain: Secondary | ICD-10-CM | POA: Diagnosis not present

## 2023-05-01 ENCOUNTER — Encounter (HOSPITAL_BASED_OUTPATIENT_CLINIC_OR_DEPARTMENT_OTHER): Payer: Self-pay | Admitting: Student

## 2023-05-01 ENCOUNTER — Ambulatory Visit (HOSPITAL_BASED_OUTPATIENT_CLINIC_OR_DEPARTMENT_OTHER): Payer: Medicare Other | Admitting: Student

## 2023-05-01 ENCOUNTER — Other Ambulatory Visit (HOSPITAL_BASED_OUTPATIENT_CLINIC_OR_DEPARTMENT_OTHER): Payer: Self-pay

## 2023-05-01 ENCOUNTER — Ambulatory Visit (INDEPENDENT_AMBULATORY_CARE_PROVIDER_SITE_OTHER): Payer: Medicare Other

## 2023-05-01 DIAGNOSIS — M5442 Lumbago with sciatica, left side: Secondary | ICD-10-CM

## 2023-05-01 DIAGNOSIS — M549 Dorsalgia, unspecified: Secondary | ICD-10-CM | POA: Diagnosis not present

## 2023-05-01 MED ORDER — METHOCARBAMOL 500 MG PO TABS
500.0000 mg | ORAL_TABLET | Freq: Four times a day (QID) | ORAL | 0 refills | Status: AC
  Filled 2023-05-01: qty 56, 14d supply, fill #0

## 2023-05-01 MED ORDER — METHYLPREDNISOLONE 4 MG PO TBPK
ORAL_TABLET | ORAL | 0 refills | Status: DC
  Filled 2023-05-01: qty 21, 1d supply, fill #0
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
## 2023-05-08 ENCOUNTER — Encounter (HOSPITAL_BASED_OUTPATIENT_CLINIC_OR_DEPARTMENT_OTHER): Payer: Self-pay

## 2023-05-08 DIAGNOSIS — M5442 Lumbago with sciatica, left side: Secondary | ICD-10-CM

## 2023-05-09 ENCOUNTER — Ambulatory Visit
Admission: RE | Admit: 2023-05-09 | Discharge: 2023-05-09 | Disposition: A | Discharge status: Home / Self Care | Payer: Medicare Other | Source: Ambulatory Visit | Attending: Student | Admitting: Student

## 2023-05-09 DIAGNOSIS — M5126 Other intervertebral disc displacement, lumbar region: Secondary | ICD-10-CM | POA: Diagnosis not present

## 2023-05-09 DIAGNOSIS — M5442 Lumbago with sciatica, left side: Secondary | ICD-10-CM

## 2023-05-12 ENCOUNTER — Encounter (HOSPITAL_BASED_OUTPATIENT_CLINIC_OR_DEPARTMENT_OTHER): Payer: Self-pay | Admitting: Orthopaedic Surgery

## 2023-05-14 ENCOUNTER — Other Ambulatory Visit: Payer: Medicare Other

## 2023-05-15 ENCOUNTER — Other Ambulatory Visit (HOSPITAL_BASED_OUTPATIENT_CLINIC_OR_DEPARTMENT_OTHER): Payer: Self-pay | Admitting: Orthopaedic Surgery

## 2023-05-15 DIAGNOSIS — M5442 Lumbago with sciatica, left side: Secondary | ICD-10-CM

## 2023-05-16 ENCOUNTER — Other Ambulatory Visit (HOSPITAL_BASED_OUTPATIENT_CLINIC_OR_DEPARTMENT_OTHER): Payer: Self-pay | Admitting: Orthopaedic Surgery

## 2023-05-16 DIAGNOSIS — M5442 Lumbago with sciatica, left side: Secondary | ICD-10-CM

## 2023-05-17 ENCOUNTER — Ambulatory Visit (HOSPITAL_BASED_OUTPATIENT_CLINIC_OR_DEPARTMENT_OTHER): Payer: Medicare Other | Admitting: Orthopaedic Surgery

## 2023-05-17 ENCOUNTER — Other Ambulatory Visit (HOSPITAL_BASED_OUTPATIENT_CLINIC_OR_DEPARTMENT_OTHER): Payer: Self-pay

## 2023-05-17 ENCOUNTER — Encounter: Payer: Self-pay | Admitting: Emergency Medicine

## 2023-05-17 ENCOUNTER — Other Ambulatory Visit: Payer: Self-pay

## 2023-05-17 ENCOUNTER — Emergency Department
Admission: EM | Admit: 2023-05-17 | Discharge: 2023-05-17 | Disposition: A | Discharge status: Home / Self Care | Payer: Medicare Other | Attending: Emergency Medicine | Admitting: Emergency Medicine

## 2023-05-17 DIAGNOSIS — R1031 Right lower quadrant pain: Secondary | ICD-10-CM | POA: Diagnosis not present

## 2023-05-17 DIAGNOSIS — M545 Low back pain, unspecified: Secondary | ICD-10-CM | POA: Diagnosis not present

## 2023-05-17 DIAGNOSIS — M5442 Lumbago with sciatica, left side: Secondary | ICD-10-CM | POA: Insufficient documentation

## 2023-05-17 DIAGNOSIS — G8929 Other chronic pain: Secondary | ICD-10-CM | POA: Diagnosis not present

## 2023-05-17 MED ORDER — OXYCODONE HCL 5 MG PO TABS
5.0000 mg | ORAL_TABLET | ORAL | 0 refills | Status: DC | PRN
  Filled 2023-05-17: qty 30, 5d supply, fill #0

## 2023-05-17 MED ORDER — OXYCODONE-ACETAMINOPHEN 5-325 MG PO TABS: 2.0000 | ORAL_TABLET | Freq: Once | ORAL | Status: DC

## 2023-05-17 MED ORDER — MORPHINE SULFATE (PF) 4 MG/ML IV SOLN
4.0000 mg | Freq: Once | INTRAVENOUS | Status: AC
  Administered 2023-05-17: 4 mg via INTRAMUSCULAR
  Filled 2023-05-17: qty 1

## 2023-05-17 NOTE — ED Triage Notes (Signed)
Patient to ED via POV for back pain. States he has a ruptured disc and can't get an injection until next month. Pain worsening.

## 2023-05-17 NOTE — Discharge Instructions (Addendum)
Please call your doctor for a follow-up appointment as soon as possible.  Return to the emergency department for any weakness or numbness of either leg, for any incontinence/accidents of your bowel or bladder, or any other symptom concerning to yourself.
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
## 2023-05-17 NOTE — ED Provider Notes (Addendum)
[facility] Provider Note    Event Date/Time   First MD Initiated Contact with Patient 05/17/23 1113     (approximate)  History   Chief Complaint: Back Pain  HPI  [name] is a 62 y.o. male with a past medical history of chronic pain, presents to the emergency department for lower back pain.  According to the patient approximate 3 weeks ago he had an exacerbation of his lower back pain he has been seeing his doctor who ordered an MRI which was performed showing a ruptured disc.  They believe the patient needs surgery but he states his insurance is not willing to pay for the surgery until he does a trial of injections.  His injection is not scheduled until next month.  States he has tried calling to expedite the injection without any success.  Patient has tried a Medrol Dosepak with no relief.  Patient states he did not know what else to do so he came to the emergency department.  Patient describes severe pain in the lower back with occasional shooting pain down the left leg.  Denies any incontinence denies any weakness.  Ambulates without issue.  Physical Exam   Triage Vital Signs: ED Triage Vitals  Encounter Vitals Group     BP 05/17/23 1053 (!) 144/115     Systolic BP Percentile --      Diastolic BP Percentile --      Pulse Rate 05/17/23 1053 (!) 117     Resp 05/17/23 1053 18     Temp 05/17/23 1053 97.9 F (36.6 C)     Temp Source 05/17/23 1053 Oral     SpO2 05/17/23 1053 93 %     Weight 05/17/23 1054 187 lb (84.8 kg)     Height 05/17/23 1054 6\' 4"  (1.93 m)     Head Circumference --      Peak Flow --      Pain Score 05/17/23 1054 8     Pain Loc --      Pain Education --      Exclude from Growth Chart --     Most recent vital signs: Vitals:   05/17/23 1053  BP: (!) 144/115  Pulse: (!) 117  Resp: 18  Temp: 97.9 F (36.6 C)  SpO2: 93%    General: Awake, no distress.  CV:  Good peripheral perfusion.  Regular rate and rhythm   Resp:  Normal effort.  Equal breath sounds bilaterally.  Abd:  No distention.  Soft, nontender.  No rebound or guarding. ED Results / Procedures / Treatments   MEDICATIONS ORDERED IN ED: Medications  morphine (PF) 4 MG/ML injection 4 mg (has no administration in time range)     IMPRESSION / MDM / ASSESSMENT AND PLAN / ED COURSE  I reviewed the triage vital signs and the nursing notes.  Patient's presentation is most consistent with severe exacerbation of chronic illness.  Patient presents emergency department for lower back pain acutely worse over the past 3 weeks with a recent MRI showing ruptured disc.  No red flags on exam no signs of cauda equina.  States pain with occasional shooting pain down the left leg consistent with sciatica but no incontinence no weakness no numbness.  Patient is following up with his doctor has injections scheduled but not until next month.  States he cannot use Lidoderm patches as he breaks out in hives.  States oral steroids have not been helpful.  I had a long  discussion with the patient regarding back pain and treatment options.  Essentially he needs to have the injection and needs to call his doctor to try to expedite this until then we can place the patient on a short course of pain medication.  He has already tried oral steroids without relief I do not believe more steroids would be of much use for the patient.  Patient appears to have good outpatient follow-up.  Provided my typical back pain return precautions.  Upon further review patient is on chronic pain medication.  He is also taking gabapentin.  Discussed with the patient that we cannot write for further pain medication he needs to follow-up with his doctor.  Will dose a one-time dose of pain medicine in the emergency department.  FINAL CLINICAL IMPRESSION(S) / ED DIAGNOSES   Acute on chronic lower back pain Sciatica  Note:  This document was prepared using Dragon voice recognition software and  [place] include unintentional dictation errors.   Minna Antis, MD 05/17/23 1138    Minna Antis, MD 05/17/23 [phone]

## 2023-05-18 ENCOUNTER — Other Ambulatory Visit: Payer: Self-pay | Admitting: Orthopaedic Surgery

## 2023-05-18 DIAGNOSIS — M5442 Lumbago with sciatica, left side: Secondary | ICD-10-CM

## 2023-05-22 ENCOUNTER — Ambulatory Visit: Admission: RE | Admit: 2023-05-22 | Payer: Medicare Other | Source: Ambulatory Visit

## 2023-05-22 DIAGNOSIS — M5442 Lumbago with sciatica, left side: Secondary | ICD-10-CM

## 2023-05-22 DIAGNOSIS — M4727 Other spondylosis with radiculopathy, lumbosacral region: Secondary | ICD-10-CM | POA: Diagnosis not present

## 2023-05-22 MED ORDER — METHYLPREDNISOLONE ACETATE 40 MG/ML INJ SUSP (RADIOLOG
80.0000 mg | Freq: Once | INTRAMUSCULAR | Status: AC
  Administered 2023-05-22: 80 mg via EPIDURAL

## 2023-05-22 MED ORDER — IOPAMIDOL (ISOVUE-M 200) INJECTION 41%
1.0000 mL | Freq: Once | INTRAMUSCULAR | Status: AC
  Administered 2023-05-22: 1 mL via EPIDURAL

## 2023-05-22 MED ORDER — TRIAMCINOLONE ACETONIDE 40 MG/ML IJ SUSP (RADIOLOGY): 60.0000 mg | Freq: Once | INTRAMUSCULAR | Status: DC

## 2023-05-22 NOTE — Discharge Instructions (Signed)

## 2023-05-22 NOTE — Telephone Encounter (Signed)
Spoke to patient's wife concerning frustrations and her husband's condition.  He is still in severe pain which has been worse at night and is unable to get any sleep.  He did have epidural injection performed this morning and is scheduled to see Dr. Christell Constant on 7/25.

## 2023-05-25 ENCOUNTER — Ambulatory Visit (INDEPENDENT_AMBULATORY_CARE_PROVIDER_SITE_OTHER): Payer: Medicare Other | Admitting: Orthopedic Surgery

## 2023-05-25 DIAGNOSIS — M5442 Lumbago with sciatica, left side: Secondary | ICD-10-CM | POA: Diagnosis not present

## 2023-05-25 DIAGNOSIS — G8929 Other chronic pain: Secondary | ICD-10-CM | POA: Diagnosis not present

## 2023-05-25 MED ORDER — PREGABALIN 75 MG PO CAPS: 75.0000 mg | ORAL_CAPSULE | Freq: Two times a day (BID) | ORAL | 1 refills | Status: DC

## 2023-05-25 NOTE — Progress Notes (Signed)
Orthopedic Spine Surgery Office Note  Assessment: Patient is a 62 y.o. male with low back pain that radiates into the left lower extremity into the buttock and lateral thigh.  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  Referral was provided to him today.  Explained that if he quit nicotine that we could do a lumbar spine surgery to help with his left leg pain but it would not reliably relieve his back pain -Would need to be nicotine free prior to any elective spine surgery -Patient should return to office on as-needed basis   Patient expressed understanding of the plan and all questions were answered to the patient's satisfaction.   ___________________________________________________________________________   History:  Patient is a 62 y.o. male who presents today for lumbar spine.  Patient has had about 4 years of low back pain that has gotten progressively worse within the last 2 months.  There is no trauma or injury that caused this acute exacerbation.  He states that the pain is severe and he feels it on a daily basis.  He has the pain in his low back going into the left leg.  He feels that the left buttock and the left lateral thigh.  He does not have any pain radiating into the right lower extremity.  His pain is so severe that he is taking Percocet 10 mg to help with the pain and it still severe.  He got an injection with [facility] that gave him a day of partial relief.   Weakness: Denies Symptoms of imbalance: Denies Paresthesias and numbness: Denies Bowel or bladder  incontinence: Denies Saddle anesthesia: Denies  Treatments tried: Oral steroids, lumbar steroid injection  Review of systems: Denies fevers and chills, night sweats, unexplained weight loss, history of cancer.  Has had pain that wakes him at night  Past medical history: Chronic pain  Allergies: Codeine, Toradol  Past surgical history:  Cervical ganglionectomies Removal of abdominal mesh Groin dissection Left wrist ORIF Left hip arthroscopy Tonsillectomy Vasectomy Right hydrocele excision Inguinal hernia repair  Social history: Reports use of nicotine product (smoking, vaping, patches, smokeless) Alcohol use: Denies Denies recreational drug use   Physical Exam:  General: no acute distress, appears stated age Neurologic: alert, answering questions appropriately, following commands Respiratory: unlabored breathing on room air, symmetric chest rise Psychiatric: appropriate affect, normal cadence to speech   MSK (spine):  -Strength exam      Left  Right EHL    5/5  5/5 TA    5/5  5/5 GSC    5/5  5/5 Knee extension  5/5  5/5 Hip flexion   5/5  5/5  -Sensory exam    Sensation intact to light touch in L3-S1 nerve distributions of bilateral lower extremities  -Achilles DTR: 2/4 on the left, 2/4 on the right -Patellar tendon DTR: 2/4 on the left, 2/4 on the right  -Straight leg raise: Positive on the left, negative on the right -Clonus: no beats bilaterally but had pain in his low back with testing on both sides -Pain with even superficial palpation over the lumbar spine -Was groaning  at multiple points in the exam  -Left hip exam: Pain with range of motion -Right hip exam: Pain with range of motion  Imaging: XR of the lumbar spine from 05/01/2023 was independently reviewed and interpreted, showing no significant degenerative changes.  No fracture or dislocation seen.  No evidence of instability on flexion/extension views.  MRI of the lumbar spine from  05/09/2023 was independently reviewed and interpreted, showing disc bulge at L4/5 causing bilateral lateral recess stenosis.  No other significant stenosis seen.   Patient name: [name] Patient MRN: [id number] Date of visit: 05/25/23

## 2023-05-29 ENCOUNTER — Other Ambulatory Visit (HOSPITAL_BASED_OUTPATIENT_CLINIC_OR_DEPARTMENT_OTHER): Payer: Self-pay | Admitting: Orthopaedic Surgery

## 2023-05-29 ENCOUNTER — Telehealth: Payer: Self-pay | Admitting: Orthopedic Surgery

## 2023-05-29 DIAGNOSIS — M5442 Lumbago with sciatica, left side: Secondary | ICD-10-CM

## 2023-05-29 NOTE — Telephone Encounter (Signed)
L4-L5 ESI ordered to [facility] per Dr. Steward Drone

## 2023-05-29 NOTE — Telephone Encounter (Signed)
[name] is calling to ask if Dr. Steward Drone will order another injection for his back at [facility].  Call back # [phone].

## 2023-06-01 ENCOUNTER — Other Ambulatory Visit (HOSPITAL_BASED_OUTPATIENT_CLINIC_OR_DEPARTMENT_OTHER): Payer: Self-pay | Admitting: Orthopaedic Surgery

## 2023-06-01 ENCOUNTER — Encounter (HOSPITAL_BASED_OUTPATIENT_CLINIC_OR_DEPARTMENT_OTHER): Payer: Self-pay | Admitting: Orthopaedic Surgery

## 2023-06-01 DIAGNOSIS — M5442 Lumbago with sciatica, left side: Secondary | ICD-10-CM

## 2023-06-06 NOTE — Discharge Instructions (Signed)
Post Procedure Spinal Discharge Instruction Sheet  You  resume a regular diet and any medications that you routinely take (including pain medications) unless otherwise noted by MD.  No driving day of procedure.  Light activity throughout the rest of the day.  Do not do any strenuous work, exercise, bending or lifting.  The day following the procedure, you can resume normal physical activity but you should refrain from exercising or physical therapy for at least three days thereafter.  You  apply ice to the injection site, 20 minutes on, 20 minutes off, as needed. Do not apply ice directly to skin.    Common Side Effects:  Headaches- take your usual medications as directed by your physician.  Increase your fluid intake.  Caffeinated beverages  be helpful.  Lie flat in bed until your headache resolves.  Restlessness or inability to sleep- you  have trouble sleeping for the next few days.  Ask your referring physician if you need any medication for sleep.  Facial flushing or redness- should subside within a few days.  Increased pain- a temporary increase in pain a day or two following your procedure is not unusual.  Take your pain medication as prescribed by your referring physician.  Leg cramps  Please contact our office at [phone] for the following symptoms: Fever greater than 100 degrees. Headaches unresolved with medication after 2-3 days. Increased swelling, pain, or redness at injection site.   Thank you for visiting [facility] today.   

## 2023-06-07 ENCOUNTER — Ambulatory Visit
Admission: RE | Admit: 2023-06-07 | Discharge: 2023-06-07 | Disposition: A | Discharge status: Home / Self Care | Payer: Medicare Other | Source: Ambulatory Visit | Attending: Orthopaedic Surgery | Admitting: Orthopaedic Surgery

## 2023-06-07 DIAGNOSIS — M5442 Lumbago with sciatica, left side: Secondary | ICD-10-CM

## 2023-06-07 DIAGNOSIS — M4727 Other spondylosis with radiculopathy, lumbosacral region: Secondary | ICD-10-CM | POA: Diagnosis not present

## 2023-06-07 MED ORDER — IOPAMIDOL (ISOVUE-M 200) INJECTION 41%
1.0000 mL | Freq: Once | INTRAMUSCULAR | Status: AC
  Administered 2023-06-07: 1 mL via EPIDURAL

## 2023-06-07 MED ORDER — METHYLPREDNISOLONE ACETATE 40 MG/ML INJ SUSP (RADIOLOG
80.0000 mg | Freq: Once | INTRAMUSCULAR | Status: AC
  Administered 2023-06-07: 80 mg via EPIDURAL

## 2023-06-12 ENCOUNTER — Other Ambulatory Visit: Payer: Medicare Other

## 2023-06-19 DIAGNOSIS — G8929 Other chronic pain: Secondary | ICD-10-CM | POA: Diagnosis not present

## 2023-06-19 DIAGNOSIS — R1031 Right lower quadrant pain: Secondary | ICD-10-CM | POA: Diagnosis not present

## 2023-06-20 DIAGNOSIS — K080 Exfoliation of teeth due to systemic causes: Secondary | ICD-10-CM | POA: Diagnosis not present

## 2023-06-21 DIAGNOSIS — K080 Exfoliation of teeth due to systemic causes: Secondary | ICD-10-CM | POA: Diagnosis not present

## 2023-06-22 DIAGNOSIS — K080 Exfoliation of teeth due to systemic causes: Secondary | ICD-10-CM | POA: Diagnosis not present

## 2023-06-26 DIAGNOSIS — K080 Exfoliation of teeth due to systemic causes: Secondary | ICD-10-CM | POA: Diagnosis not present

## 2023-07-17 DIAGNOSIS — G8929 Other chronic pain: Secondary | ICD-10-CM | POA: Diagnosis not present

## 2023-07-17 DIAGNOSIS — R1031 Right lower quadrant pain: Secondary | ICD-10-CM | POA: Diagnosis not present

## 2023-08-14 DIAGNOSIS — R1031 Right lower quadrant pain: Secondary | ICD-10-CM | POA: Diagnosis not present

## 2023-08-14 DIAGNOSIS — G8929 Other chronic pain: Secondary | ICD-10-CM | POA: Diagnosis not present

## 2023-08-21 ENCOUNTER — Encounter (HOSPITAL_BASED_OUTPATIENT_CLINIC_OR_DEPARTMENT_OTHER): Payer: Self-pay | Admitting: Orthopaedic Surgery

## 2023-09-15 DIAGNOSIS — G8929 Other chronic pain: Secondary | ICD-10-CM | POA: Diagnosis not present

## 2023-09-15 DIAGNOSIS — R1031 Right lower quadrant pain: Secondary | ICD-10-CM | POA: Diagnosis not present

## 2023-10-20 DIAGNOSIS — R1031 Right lower quadrant pain: Secondary | ICD-10-CM | POA: Diagnosis not present

## 2023-10-20 DIAGNOSIS — G8929 Other chronic pain: Secondary | ICD-10-CM | POA: Diagnosis not present

## 2023-12-01 DIAGNOSIS — R1031 Right lower quadrant pain: Secondary | ICD-10-CM | POA: Diagnosis not present

## 2023-12-01 DIAGNOSIS — G8929 Other chronic pain: Secondary | ICD-10-CM | POA: Diagnosis not present

## 2023-12-25 ENCOUNTER — Other Ambulatory Visit: Payer: Self-pay

## 2023-12-25 ENCOUNTER — Emergency Department: Payer: Medicare Other

## 2023-12-25 ENCOUNTER — Emergency Department
Admission: EM | Admit: 2023-12-25 | Discharge: 2023-12-25 | Disposition: A | Discharge status: Home / Self Care | Payer: Medicare Other | Attending: Emergency Medicine | Admitting: Emergency Medicine

## 2023-12-25 ENCOUNTER — Encounter: Payer: Self-pay | Admitting: Emergency Medicine

## 2023-12-25 DIAGNOSIS — E041 Nontoxic single thyroid nodule: Secondary | ICD-10-CM | POA: Diagnosis not present

## 2023-12-25 DIAGNOSIS — Z87442 Personal history of urinary calculi: Secondary | ICD-10-CM | POA: Insufficient documentation

## 2023-12-25 DIAGNOSIS — R079 Chest pain, unspecified: Secondary | ICD-10-CM | POA: Diagnosis not present

## 2023-12-25 DIAGNOSIS — R059 Cough, unspecified: Secondary | ICD-10-CM | POA: Insufficient documentation

## 2023-12-25 DIAGNOSIS — R531 Weakness: Secondary | ICD-10-CM | POA: Diagnosis not present

## 2023-12-25 DIAGNOSIS — R002 Palpitations: Secondary | ICD-10-CM | POA: Diagnosis not present

## 2023-12-25 DIAGNOSIS — J432 Centrilobular emphysema: Secondary | ICD-10-CM | POA: Diagnosis not present

## 2023-12-25 DIAGNOSIS — I2510 Atherosclerotic heart disease of native coronary artery without angina pectoris: Secondary | ICD-10-CM | POA: Diagnosis not present

## 2023-12-25 DIAGNOSIS — R0789 Other chest pain: Secondary | ICD-10-CM | POA: Diagnosis not present

## 2023-12-25 DIAGNOSIS — R0602 Shortness of breath: Secondary | ICD-10-CM | POA: Insufficient documentation

## 2023-12-25 LAB — TROPONIN I (HIGH SENSITIVITY)
Troponin I (High Sensitivity): 4 ng/L (ref <18)
Troponin I (High Sensitivity): 3 ng/L (ref <18)

## 2023-12-25 LAB — BASIC METABOLIC PANEL
Anion gap: 10 (ref 5–15)
BUN: 16 mg/dL (ref 8–23)
CO2: 26 mmol/L (ref 22–32)
Calcium: 9 mg/dL (ref 8.9–10.3)
Chloride: 104 mmol/L (ref 98–111)
Creatinine, Ser: 1.17 mg/dL (ref 0.61–1.24)
GFR, Estimated: >60 mL/min (ref >60)
Glucose, Bld: 109 mg/dL — ABNORMAL HIGH (ref 70–99)
Potassium: 4 mmol/L (ref 3.5–5.1)
Sodium: 140 mmol/L (ref 135–145)

## 2023-12-25 LAB — CBC
HCT: 47 % (ref 39.0–52.0)
Hemoglobin: 16.1 g/dL (ref 13.0–17.0)
MCH: 33.8 pg (ref 26.0–34.0)
MCHC: 34.3 g/dL (ref 30.0–36.0)
MCV: 98.5 fL (ref 80.0–100.0)
Platelets: 220 10*3/uL (ref 150–400)
RBC: 4.77 MIL/uL (ref 4.22–5.81)
RDW: 12.6 % (ref 11.5–15.5)
WBC: 7.2 10*3/uL (ref 4.0–10.5)
nRBC: 0 % (ref 0.0–0.2)

## 2023-12-25 LAB — RESP PANEL BY RT-PCR (RSV, FLU A&B, COVID)  RVPGX2
Influenza A by PCR: NEGATIVE
Influenza B by PCR: NEGATIVE
Resp Syncytial Virus by PCR: NEGATIVE
SARS Coronavirus 2 by RT PCR: NEGATIVE

## 2023-12-25 LAB — D-DIMER, QUANTITATIVE: D-Dimer, Quant: 0.87 ug{FEU}/mL — ABNORMAL HIGH (ref 0.00–0.50)

## 2023-12-25 LAB — BRAIN NATRIURETIC PEPTIDE: B Natriuretic Peptide: 11.5 pg/mL (ref 0.0–100.0)

## 2023-12-25 MED ORDER — IOHEXOL 350 MG/ML SOLN
75.0000 mL | Freq: Once | INTRAVENOUS | Status: AC | PRN
  Administered 2023-12-25: 75 mL via INTRAVENOUS

## 2023-12-25 MED ORDER — OXYCODONE-ACETAMINOPHEN 5-325 MG PO TABS
1.0000 | ORAL_TABLET | Freq: Once | ORAL | Status: AC
  Administered 2023-12-25: 1 via ORAL
  Filled 2023-12-25: qty 1

## 2023-12-25 NOTE — ED Provider Notes (Signed)
 ED ECG REPORT I, Sharyn Creamer, the attending physician, personally viewed and interpreted this ECG.  Date: 12/25/2023 EKG Time: 1540 Rate: 80 Rhythm: normal sinus rhythm QRS Axis: normal Intervals: normal ST/T Wave abnormalities: normal Narrative Interpretation: no evidence of acute ischemia.  Slight artifact but within the bounds of this ECG no evidence of acute ischemia is noted    Sharyn Creamer, MD 12/25/23 [phone]

## 2023-12-25 NOTE — ED Triage Notes (Addendum)
 Patient presents for c/o chest pain since lunchtime yesterday. Reports no relief of sxs. Reports pressure on L side mostly.   Did take aspirin this AM. Unsure of dose.   Does report cardiac hx.

## 2023-12-25 NOTE — ED Provider Notes (Addendum)
 [facility] Provider Note    Event Date/Time   First MD Initiated Contact with Patient 12/25/23 1700     (approximate)   History   Chest Pain   HPI  [name] is a 63 y.o. male with history of chronic back pain and kidney stones who presents with chest pain since yesterday, substernal, pressure-like, nonradiating.  He states it is not really exertional, but he has had some shortness of breath and weakness which does seem to be associated with exertion.  He has had some pain like this previously but this episode was more severe.  It is not changed with position or with touching his chest.  He reports a mild cough but no fever.  He has no leg swelling.  I reviewed the past medical records per the patient's most recent outpatient encounter was with general surgery on 1/31 for follow-up of chronic right groin pain.  It appears that he has a history of a prior cardiac catheterization but no CAD.   Physical Exam   Triage Vital Signs: ED Triage Vitals  Encounter Vitals Group     BP 12/25/23 1533 (!) 144/91     Systolic BP Percentile --      Diastolic BP Percentile --      Pulse Rate 12/25/23 1533 86     Resp 12/25/23 1533 18     Temp 12/25/23 1533 97.7 F (36.5 C)     Temp Source 12/25/23 1533 Oral     SpO2 12/25/23 1533 97 %     Weight 12/25/23 1536 181 lb 12.8 oz (82.5 kg)     Height 12/25/23 1536 6' (1.829 m)     Head Circumference --      Peak Flow --      Pain Score 12/25/23 1537 4     Pain Loc --      Pain Education --      Exclude from Growth Chart --     Most recent vital signs: Vitals:   12/25/23 1727 12/25/23 1730  BP:  (!) 160/89  Pulse:  70  Resp:  13  Temp:    SpO2: 100% 100%     General: Awake, well-appearing, no distress.  CV:  Good peripheral perfusion.  Normal heart sounds.  Chest wall nontender. Resp:  Normal effort.  Lungs CTAB. Abd:  No distention.  Other:  No peripheral edema.   ED Results / Procedures /  Treatments   Labs (all labs ordered are listed, but only abnormal results are displayed) Labs Reviewed  BASIC METABOLIC PANEL - Abnormal; Notable for the following components:      Result Value   Glucose, Bld 109 (*)    All other components within normal limits  D-DIMER, QUANTITATIVE - Abnormal; Notable for the following components:   D-Dimer, Quant 0.87 (*)    All other components within normal limits  RESP PANEL BY RT-PCR (RSV, FLU A&B, COVID)  RVPGX2  CBC  BRAIN NATRIURETIC PEPTIDE  TROPONIN I (HIGH SENSITIVITY)  TROPONIN I (HIGH SENSITIVITY)     EKG  ED ECG REPORT I, Dionne Bucy, the attending physician, personally viewed and interpreted this ECG.  Date: 12/25/2023 EKG Time: 1535 Rate: 75 Rhythm: normal sinus rhythm QRS Axis: normal Intervals: normal ST/T Wave abnormalities: normal Narrative Interpretation: no evidence of acute ischemia    RADIOLOGY  Chest x-ray: I independently viewed and interpreted the images; there is no focal consolidation or edema  PROCEDURES:  Critical Care performed:  No  Procedures   MEDICATIONS ORDERED IN ED: Medications  oxyCODONE-acetaminophen (PERCOCET/ROXICET) 5-325 MG per tablet 1 tablet (1 tablet Oral Given 12/25/23 1731)  iohexol (OMNIPAQUE) 350 MG/ML injection 75 mL (75 mLs Intravenous Contrast Given 12/25/23 1817)     IMPRESSION / MDM / ASSESSMENT AND PLAN / ED COURSE  I reviewed the triage vital signs and the nursing notes.  63 year old male with PMH as noted above presents with somewhat atypical chest pain since yesterday although it appears to be improving today.  On exam he is hypertensive with otherwise normal vital signs.  Physical exam is unremarkable for acute findings.  EKG is nonischemic.  Differential diagnosis includes, but is not limited to, musculoskeletal pain, GERD, neuropathic pain, myalgias, viral syndrome, less likely ACS or PE.  There is no clinical evidence for aortic dissection or vascular  etiology.  We will obtain cardiac enzymes x 2 to rule out ACS as well as basic labs.  I cannot rule the patient out for PE by [facility] and given the relatively acute onset of the pain we will obtain a D-dimer for further evaluation.  Patient's presentation is most consistent with acute presentation with potential threat to life or bodily function.  The patient is on the cardiac monitor to evaluate for evidence of arrhythmia and/or significant heart rate changes.  ----------------------------------------- 6:14 PM on 12/25/2023 -----------------------------------------  Troponins are negative x 2.  D-dimer somewhat elevated so we will obtain a CTA.  ----------------------------------------- 8:08 PM on 12/25/2023 -----------------------------------------  CTA has been obtained and we are awaiting the radiology report.  However the patient does not want to wait in the ED further.  He states he is feeling better and wants to go home.  I counseled him on the results of the workup so far.  There is no evidence of ACS.  I advised him that I will follow-up on the CTA result and call him before the end of my shift tonight.  I confirmed that we had the correct contact number listed.  However I emphasized that if I am unable to get in touch with him and the CT does show a PE or other life-threatening condition, this could result in a situation causing permanent disability or death.  I advised that we normally recommend people stay until their results are completed.  I advised the patient to follow-up with his primary care provider.  Given strict return precautions and he expressed understanding.  ----------------------------------------- 9:33 PM on 12/25/2023 -----------------------------------------  CT is negative for PE.  I called the patient and spoke to him on the phone informing him of the result.  I reiterated the return precautions.  The CT also noted a 1.7 cm thyroid nodule.  The patient was aware  of this and states that he has already had it worked up as an outpatient.  FINAL CLINICAL IMPRESSION(S) / ED DIAGNOSES   Final diagnoses:  Nonspecific chest pain     Rx / DC Orders   ED Discharge Orders     None        Note:  This document was prepared using Dragon voice recognition software and Spies include unintentional dictation errors.    Dionne Bucy, MD 12/25/23 Redmond Pulling, MD 12/25/23 2133

## 2023-12-25 NOTE — Discharge Instructions (Signed)
 Follow-up with your primary care provider.  Return to the ER for new, worsening, or persistent severe chest pain, difficulty breathing, weakness or lightheadedness, or any other new or worsening symptoms that concern you.

## 2023-12-26 DIAGNOSIS — R0789 Other chest pain: Secondary | ICD-10-CM | POA: Diagnosis not present

## 2023-12-26 DIAGNOSIS — M5412 Radiculopathy, cervical region: Secondary | ICD-10-CM | POA: Diagnosis not present

## 2023-12-26 DIAGNOSIS — M7912 Myalgia of auxiliary muscles, head and neck: Secondary | ICD-10-CM | POA: Diagnosis not present

## 2024-01-07 ENCOUNTER — Encounter

## 2024-03-29 ENCOUNTER — Other Ambulatory Visit: Payer: Self-pay | Admitting: Surgery

## 2024-03-29 DIAGNOSIS — R1031 Right lower quadrant pain: Secondary | ICD-10-CM

## 2024-04-10 ENCOUNTER — Ambulatory Visit
Admission: RE | Admit: 2024-04-10 | Discharge: 2024-04-10 | Disposition: A | Discharge status: Home / Self Care | Source: Ambulatory Visit | Attending: Surgery | Admitting: Surgery

## 2024-04-10 DIAGNOSIS — R1031 Right lower quadrant pain: Secondary | ICD-10-CM

## 2024-04-10 MED ORDER — IOPAMIDOL (ISOVUE-300) INJECTION 61%
100.0000 mL | Freq: Once | INTRAVENOUS | Status: AC | PRN
  Administered 2024-04-10: 100 mL via INTRAVENOUS

## 2024-04-11 ENCOUNTER — Ambulatory Visit: Payer: Self-pay | Admitting: Surgery

## 2024-05-28 NOTE — Progress Notes (Signed)
 ENCOUNTER: Patient Class :No patient class for patient encounter Department: [facility] CLINIC [address] [place] [address]  PATIENT: Patient Demographics      Name Patient ID SSN Gender Identity Birth Date   [name], [name] R [id number] Male January 18, 2061 (62 yrs)          Address Phone Email       [address] [place] [address] [phone] [email or web address]) [email or web address]            [place] Race         [place] Caucasian/White             Reg Status PCP Date Last Verified Next Review Date     Verified Cleotilde Oneil Novel [phone] 05/27/24 06/26/24           Marital Status Religion Language       Married Unknown-Patient Declined English              EMERGENCY CONTACT: Name Relationship Lgl Grd Work [employer] Phone  1. Civil,ALICIA Spouse   [phone]    GUARANTOR: There is no guarantor information entered for this encounter.  COVERAGE: Primary Visit Coverage      Payer Plan Group Number Group Name Payer Phone Plan Phone   No coverage found                Secondary Visit Coverage      Payer Plan Group Number Group Name Payer Phone Plan Phone   No coverage found                Primary Coverage      Payer Plan Group Number Group Name Payer Phone Plan Phone   BCBS MEDICARE ADVANTAGE PLAN [place] MEDICARE [facility] [facility] [id number] Medicare Individual Group [id number]             Primary Subscriber      Subscriber ID Subscriber Name [facility] Subscriber Address   [id number] [name], [name] [id number] [address]      [place], [place] [address]           Secondary Coverage      Payer Plan Group Number Group Name Payer Phone Plan Phone   No coverage found

## 2024-06-18 ENCOUNTER — Ambulatory Visit: Admitting: Urology

## 2024-06-27 ENCOUNTER — Other Ambulatory Visit: Payer: Self-pay | Admitting: Surgery

## 2024-06-27 DIAGNOSIS — R310 Gross hematuria: Secondary | ICD-10-CM

## 2024-06-27 DIAGNOSIS — G8918 Other acute postprocedural pain: Secondary | ICD-10-CM

## 2024-06-27 DIAGNOSIS — N400 Enlarged prostate without lower urinary tract symptoms: Secondary | ICD-10-CM

## 2024-07-01 DIAGNOSIS — N400 Enlarged prostate without lower urinary tract symptoms: Secondary | ICD-10-CM

## 2024-07-01 HISTORY — DX: Benign prostatic hyperplasia without lower urinary tract symptoms: N40.0

## 2024-07-03 ENCOUNTER — Inpatient Hospital Stay: Admission: RE | Admit: 2024-07-03 | Source: Ambulatory Visit

## 2024-07-04 ENCOUNTER — Ambulatory Visit
Admission: RE | Admit: 2024-07-04 | Discharge: 2024-07-04 | Disposition: A | Discharge status: Home / Self Care | Source: Ambulatory Visit | Attending: Surgery | Admitting: Surgery

## 2024-07-04 ENCOUNTER — Other Ambulatory Visit: Payer: Self-pay | Admitting: Surgery

## 2024-07-04 DIAGNOSIS — G8918 Other acute postprocedural pain: Secondary | ICD-10-CM

## 2024-07-04 DIAGNOSIS — N400 Enlarged prostate without lower urinary tract symptoms: Secondary | ICD-10-CM

## 2024-07-04 DIAGNOSIS — R310 Gross hematuria: Secondary | ICD-10-CM

## 2024-07-04 MED ORDER — GADOPICLENOL 0.5 MMOL/ML IV SOLN
8.0000 mL | Freq: Once | INTRAVENOUS | Status: AC | PRN
  Administered 2024-07-04: 8 mL via INTRAVENOUS

## 2024-07-09 ENCOUNTER — Other Ambulatory Visit: Payer: Self-pay | Admitting: Surgery

## 2024-07-10 ENCOUNTER — Other Ambulatory Visit

## 2024-07-10 ENCOUNTER — Ambulatory Visit (INDEPENDENT_AMBULATORY_CARE_PROVIDER_SITE_OTHER): Admitting: Urology

## 2024-07-10 VITALS — BP 116/77 | HR 86 | Ht 72.0 in | Wt 175.0 lb

## 2024-07-10 DIAGNOSIS — R399 Unspecified symptoms and signs involving the genitourinary system: Secondary | ICD-10-CM

## 2024-07-10 DIAGNOSIS — N401 Enlarged prostate with lower urinary tract symptoms: Secondary | ICD-10-CM | POA: Diagnosis not present

## 2024-07-10 DIAGNOSIS — R310 Gross hematuria: Secondary | ICD-10-CM

## 2024-07-10 DIAGNOSIS — Z125 Encounter for screening for malignant neoplasm of prostate: Secondary | ICD-10-CM | POA: Diagnosis not present

## 2024-07-10 DIAGNOSIS — R338 Other retention of urine: Secondary | ICD-10-CM

## 2024-07-10 LAB — URINALYSIS, COMPLETE
Bilirubin, UA: NEGATIVE
Glucose, UA: NEGATIVE
Ketones, UA: NEGATIVE
Leukocytes,UA: NEGATIVE
Nitrite, UA: NEGATIVE
Specific Gravity, UA: 1.03 (ref 1.005–1.030)
Urobilinogen, Ur: 0.2 mg/dL (ref 0.2–1.0)
pH, UA: 6 (ref 5.0–7.5)

## 2024-07-10 LAB — MICROSCOPIC EXAMINATION: RBC, Urine: >30 /HPF — AB (ref 0–2)
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
## 2024-07-10 NOTE — Patient Instructions (Signed)
 SABRA

## 2024-07-11 ENCOUNTER — Other Ambulatory Visit: Payer: Self-pay

## 2024-07-11 ENCOUNTER — Encounter (HOSPITAL_COMMUNITY): Payer: Self-pay | Admitting: Surgery

## 2024-07-11 NOTE — H&P (Signed)
 MRN: [id number] DOB: 1961/09/14  Subjective   Chief Complaint: severe right groin pain   History of Present Illness: [name] is a 63 y.o. male who is seen today for reevaluation of his chronic groin pain  Over the past several months his pain is acutely worsened in the right inguinal area. It is mostly superior and lateral to his incision where there is a fullness. The CT scan of his abdomen pelvis was unremarkable other than an enlarged prostate. He has been having some intermittent hematuria and difficulty urinating. He has a follow-up with his urologist in September. The last injection did help but again this pain is worse than before. He has resumed gabapentin  to see if this will help as well.  Again, he has had issues with chronic groin pain ever since his initial hernia surgery back in 2015.  He has had issues with seromas after that surgery and has had to have groin explorations as well as removal of his right cord and testicle.  He has had all his mesh removed as well as sutures and again we have been frequently doing ilioinguinal nerve blocks to relieve his discomfort.  Most recently, his most severe pain again is occurred up near the right anterior superior iliac spine on the abdominal wall at the most lateral aspect of his incision.  There is no obvious recurrent hernia.    Review of Systems: A complete review of systems was obtained from the patient. I have reviewed this information and discussed as appropriate with the patient. See HPI as well for other ROS.  ROS   Medical History: Past Medical History:  Diagnosis Date  Allergy 2019  Seasonal  Aortic atherosclerosis () 10/2020  Cervical disc disease 03/31/2014  COPD (chronic obstructive pulmonary disease) (CMS/HHS-HCC) 11/26/2020  COVID-19 10/2020  DDD (degenerative disc disease), lumbar  Emphysema of lung (CMS/HHS-HCC)  Kidney stones 03/31/2014  Right thyroid  nodule 11/12/2020  Thoracic spondylosis   Patient  Active Problem List  Diagnosis  Cervical disc disease  Thoracic spondylosis  DDD (degenerative disc disease), lumbar  Lung nodule, multiple  B12 deficiency  Right thyroid  nodule  Medicare annual wellness visit, initial  COPD (chronic obstructive pulmonary disease) (CMS/HHS-HCC)  Nephrolithiasis  Chronic groin pain  Infrarenal abdominal aortic aneurysm (AAA) without rupture ()   Past Surgical History:  Procedure Laterality Date  COLONOSCOPY 2011  [place], [place], with two polyps retrieved  COLONOSCOPY 05/25/2018  Hyperplastic colon polyp/PHx CP/Repeat 52yrs/TKT  OTHER SURGERY Left 10/27/2022  Hip tear repair per patient.  FRACTURE SURGERY  HERNIA REPAIR  POSTERIOR LAMINECTOMY / DECOMPRESSION CERVICAL SPINE  right inguinal hernia  TONSILLECTOMY    Allergies  Allergen Reactions  Codeine Nausea and Nausea And Vomiting  Ketorolac Tromethamine Nausea And Vomiting  Ketorolac Nausea and Vomiting  Other reaction(s): gastric irritation   Current Outpatient Medications on File Prior to Visit  Medication Sig Dispense Refill  aspirin  81 MG EC tablet Take 81 mg by mouth once daily  cyanocobalamin  (VITAMIN B12) 1,000 mcg/mL injection INJECT 1 ML (1,000 MCG TOTAL) INTO THE MUSCLE MONTHLY 12 mL 1  finasteride (PROSCAR) 5 mg tablet Take 1 tablet (5 mg total) by mouth once daily 90 tablet 3  gabapentin  (NEURONTIN ) 300 MG capsule Take 1 capsule (300 mg total) by mouth 3 (three) times daily 90 capsule 3  oxyCODONE  (ROXICODONE ) 5 MG immediate release tablet Take 1 tablet (5 mg total) by mouth every 6 (six) hours as needed for Pain 25 tablet 0  oxyCODONE -acetaminophen  (  PERCOCET) 5-325 mg tablet Take 1 tablet by mouth every 6 (six) hours as needed for Pain 25 tablet 0  pantoprazole  (PROTONIX ) 40 MG DR tablet Take 1 tablet (40 mg total) by mouth once daily 90 tablet 3  promethazine  (PHENERGAN ) 25 MG tablet TAKE 1 TABLET BY MOUTH EVERY 6 HOURS AS NEEDED FOR NAUSEA 30 tablet 0  tamsulosin (FLOMAX)  0.4 mg capsule Take 1 capsule (0.4 mg total) by mouth once daily 30 MINUTES AFTER SAME MEAL EACH DAY 90 capsule 3   Current Facility-Administered Medications on File Prior to Visit  Medication Dose Route Frequency Provider Last Rate Last Admin  cyanocobalamin  (VITAMIN B12) injection 1,000 mcg 1,000 mcg Intramuscular Q30 Days Cleotilde Oneil Novel, MD 1,000 mcg at 03/06/24 1518   Family History  Problem Relation Age of Onset  High blood pressure (Hypertension) Mother  Stroke Mother  Colon cancer Father  Deep vein thrombosis (DVT or abnormal blood clot formation) Daughter  Diabetes type II Daughter  Osteoarthritis Daughter  Osteoporosis (Thinning of bones) Daughter  Colon polyps Son    Social History   Tobacco Use  Smoking Status Every Day  Current packs/day: 0.00  Average packs/day: 0.5 packs/day for 40.0 years (20.0 ttl pk-yrs)  Types: Cigarettes  Start date: 05/13/1978  Last attempt to quit: 05/13/2018  Years since quitting: 6.1  Smokeless Tobacco Former  Quit date: 05/13/2018    Social History   Socioeconomic History  Marital status: Married  Tobacco Use  Smoking status: Every Day  Current packs/day: 0.00  Average packs/day: 0.5 packs/day for 40.0 years (20.0 ttl pk-yrs)  Types: Cigarettes  Start date: 05/13/1978  Last attempt to quit: 05/13/2018  Years since quitting: 6.1  Smokeless tobacco: Former  Quit date: 05/13/2018  Vaping Use  Vaping status: Never Used  Substance and Sexual Activity  Alcohol use: Not Currently  Alcohol/week: 0.0 standard drinks of alcohol  Drug use: No  Sexual activity: Yes  Partners: Female  Social History Narrative  Disabled .   Social Drivers of Corporate investment banker Strain: Low Risk (01/19/2024)  Overall Financial Resource Strain (CARDIA)  Difficulty of Paying Living Expenses: Not hard at all  Food Insecurity: No Food Insecurity (01/19/2024)  Hunger Vital Sign  Worried About Running Out of Food in the Last Year: Never true   Ran Out of Food in the Last Year: Never true  Transportation Needs: No Transportation Needs (01/19/2024)  PRAPARE - Risk analyst (Medical): No  Lack of Transportation (Non-Medical): No  Housing Stability: Low Risk (01/19/2024)  Housing Stability Vital Sign  Unable to Pay for Housing in the Last Year: No  Number of Times Moved in the Last Year: 0  Homeless in the Last Year: No   Objective:   There were no vitals filed for this visit.  There is no height or weight on file to calculate BMI.  Physical Exam   He appears uncomfortable today  His right inguinal incision is well-healed. Just lateral and superior to this is a fullness and almost cording like scar tissue which is very tender. There is no erythema. After obtaining consent I again injected a mixture of Marcaine  and lidocaine  into the area. There is no inguinal adenopath  Labs, Imaging and Diagnostic Testing:  Assessment and Plan:   Diagnoses and all orders for this visit:  Acute postoperative pain of right groin - MRI pelvis with and without contrast; Future  Hematuria, gross - MRI pelvis with and without contrast; Future  Enlarged  prostate - MRI pelvis with and without contrast; Future  Chronic pain of right inguinal region - oxyCODONE  (ROXICODONE ) 5 MG immediate release tablet; Take 1 tablet (5 mg total) by mouth every 6 (six) hours as needed for Pain    At this point, I believe he needs a stat MRI of his pelvis to evaluate the area of swelling along the lateral inguinal area on the right side as well as evaluate his prostate which is continuing to enlarge and his hematuria. Again, I Henshaw need to explore his groin in this area of concern as this Nieblas be scar tissue proceed could still be nerve entrapment issues. We also discussed referral to an anesthesia pain clinic through [facility]. We have ordered the MRI stat and I will call back with the results we will determine how to  proceed.  Addendum: The MRI of his pelvis showed no other abnormalities other than he has a large prostate.  There was no adenopathy, no areas of inflammation, no evidence of recurrent hernia, or other abnormalities.  He can still feel a cordlike area in the area of his discomfort with hard scar underneath the area of maximum tenderness which does improve with injections.  We again discussed referral to an anesthesia pain clinic for attempted nerve ablation versus exploration of the groin with excision of the scar tissue and potential neurectomy depending on operative findings and ability to visualize the nerve.  We discussed the risk of surgery which includes but is not limited to bleeding, infection, the chance this Younis not resolve his discomfort or Benedick cause worsening of his pain, injury to surrounding structures, cardiopulmonary issues and anesthesia, postoperative recovery, excetra.  He understands and wished to proceed with the right groin exploration which has been scheduled

## 2024-07-11 NOTE — Anesthesia Preprocedure Evaluation (Signed)
 Anesthesia Evaluation  Patient identified by MRN, date of birth, ID band Patient awake    Reviewed: Allergy & Precautions, NPO status , Patient's Chart, lab work & pertinent test results  History of Anesthesia Complications (+) PONV and history of anesthetic complications  Airway Mallampati: III  TM Distance: >3 FB Neck ROM: Limited   Comment: Previous grade IV view with MAC 4, easy mask Dental  (+) Partial Upper   Pulmonary neg shortness of breath, neg sleep apnea, COPD, neg recent URI, Current SmokerPatient did not abstain from smoking.   Pulmonary exam normal breath sounds clear to auscultation       Cardiovascular (-) hypertension(-) angina (-) Past MI, (-) Cardiac Stents and (-) CABG (-) dysrhythmias  Rhythm:Regular Rate:Normal  Low-risk stress test 02/29/2016  TTE 02/28/2016: Study Conclusions   - Left ventricle: The cavity size was normal. Systolic function was    normal. The estimated ejection fraction was in the range of 60%    to 65%. Wall motion was normal; there were no regional wall    motion abnormalities.  - Left atrium: The atrium was normal in size.  - Right ventricle: Systolic function was normal.  - Pulmonary arteries: Systolic pressure was within the normal    range.     Neuro/Psych  Headaches, neg Seizures H/o neck injury, s/p c-spine surgery    GI/Hepatic Neg liver ROS,GERD  Medicated,,Diverticulosis    Endo/Other  negative endocrine ROS    Renal/GU Renal disease (h/o stones)   BPH    Musculoskeletal   Abdominal   Peds  Hematology negative hematology ROS (+)          Anesthesia Other Findings   Reproductive/Obstetrics                              Anesthesia Physical Anesthesia Plan  ASA: 2  Anesthesia Plan: General   Post-op Pain Management: Regional block* and Tylenol  PO (pre-op)*   Induction: Intravenous  PONV Risk Score and Plan: 2 and  Dexamethasone , Ondansetron  and Treatment [place] vary due to age or medical condition  Airway Management Planned: Oral ETT and Video Laryngoscope Planned  Additional Equipment:   Intra-op Plan:   Post-operative Plan: Extubation in OR  Informed Consent: I have reviewed the patients History and Physical, chart, labs and discussed the procedure including the risks, benefits and alternatives for the proposed anesthesia with the patient or authorized representative who has indicated his/her understanding and acceptance.     Dental advisory given  Plan Discussed with: CRNA and Anesthesiologist  Anesthesia Plan Comments: (Risks of general anesthesia discussed including, but not limited to, sore throat, hoarse voice, chipped/damaged teeth, injury to vocal cords, nausea and vomiting, allergic reactions, lung infection, heart attack, stroke, and death. All questions answered. )         Anesthesia Quick Evaluation

## 2024-07-11 NOTE — Progress Notes (Signed)
 PCP - Dr Oneil Pinal Cardiologist - none  Chest x-ray - 12/25/23 EKG - 12/25/23 Stress Test - 02/29/16 ECHO - 02/28/16 Cardiac Cath - 12/19/05  ICD Pacemaker/Loop - n/a  Sleep Study -  n/a  Diabetes - n/a  Blood Thinner Instructions:  n/a  Aspirin  Instructions: Follow your surgeon's instructions on when to stop aspirin  prior to surgery,  If no instructions were given by your surgeon then you will need to call the office for those instructions. Patient agrees to call MD for ASA instructions.  ERAS - clear liquids til 0430 DOS.  Anesthesia review: no  STOP now taking any Aspirin  (unless otherwise instructed by your surgeon), Aleve, Naproxen, Ibuprofen , Motrin , Advil , Goody's, BC's, all herbal medications, fish oil, and all vitamins.   Coronavirus Screening Do you have any of the following symptoms:  Cough yes/no: No Fever (>100.72F)  yes/no: No Runny nose yes/no: No Sore throat yes/no: No Difficulty breathing/shortness of breath  yes/no: No  Have you traveled in the last 14 days and where? yes/no: No  Patient verbalized understanding of instructions that were given via phone.

## 2024-07-12 ENCOUNTER — Encounter (HOSPITAL_COMMUNITY): Payer: Self-pay | Admitting: Surgery

## 2024-07-12 ENCOUNTER — Ambulatory Visit (HOSPITAL_COMMUNITY)
Admission: RE | Admit: 2024-07-12 | Discharge: 2024-07-12 | Disposition: A | Discharge status: Home / Self Care | Attending: Surgery | Admitting: Surgery

## 2024-07-12 ENCOUNTER — Encounter (HOSPITAL_COMMUNITY)
Admission: RE | Disposition: A | Discharge status: Home / Self Care | Payer: Self-pay | Source: Home / Self Care | Attending: Surgery

## 2024-07-12 ENCOUNTER — Ambulatory Visit (HOSPITAL_COMMUNITY): Payer: Self-pay

## 2024-07-12 ENCOUNTER — Other Ambulatory Visit: Payer: Self-pay

## 2024-07-12 DIAGNOSIS — G8918 Other acute postprocedural pain: Secondary | ICD-10-CM | POA: Insufficient documentation

## 2024-07-12 DIAGNOSIS — Z9889 Other specified postprocedural states: Secondary | ICD-10-CM | POA: Insufficient documentation

## 2024-07-12 DIAGNOSIS — Z79899 Other long term (current) drug therapy: Secondary | ICD-10-CM | POA: Insufficient documentation

## 2024-07-12 DIAGNOSIS — R310 Gross hematuria: Secondary | ICD-10-CM | POA: Insufficient documentation

## 2024-07-12 DIAGNOSIS — N400 Enlarged prostate without lower urinary tract symptoms: Secondary | ICD-10-CM | POA: Insufficient documentation

## 2024-07-12 DIAGNOSIS — R1031 Right lower quadrant pain: Secondary | ICD-10-CM

## 2024-07-12 DIAGNOSIS — L905 Scar conditions and fibrosis of skin: Secondary | ICD-10-CM | POA: Diagnosis not present

## 2024-07-12 DIAGNOSIS — F17210 Nicotine dependence, cigarettes, uncomplicated: Secondary | ICD-10-CM | POA: Diagnosis not present

## 2024-07-12 DIAGNOSIS — G8929 Other chronic pain: Secondary | ICD-10-CM | POA: Insufficient documentation

## 2024-07-12 DIAGNOSIS — M795 Residual foreign body in soft tissue: Secondary | ICD-10-CM | POA: Diagnosis not present

## 2024-07-12 DIAGNOSIS — K219 Gastro-esophageal reflux disease without esophagitis: Secondary | ICD-10-CM | POA: Insufficient documentation

## 2024-07-12 HISTORY — DX: Emphysema, unspecified: J43.9

## 2024-07-12 HISTORY — DX: Gastro-esophageal reflux disease without esophagitis: K21.9

## 2024-07-12 HISTORY — PX: EXCISION MASS LOWER EXTREMETIES: SHX6705

## 2024-07-12 HISTORY — DX: Pneumonia, unspecified organism: J18.9

## 2024-07-12 LAB — BASIC METABOLIC PANEL WITH GFR
Anion gap: 10 (ref 5–15)
BUN: 11 mg/dL (ref 8–23)
CO2: 20 mmol/L — ABNORMAL LOW (ref 22–32)
Calcium: 8.6 mg/dL — ABNORMAL LOW (ref 8.9–10.3)
Chloride: 108 mmol/L (ref 98–111)
Creatinine, Ser: 1.1 mg/dL (ref 0.61–1.24)
GFR, Estimated: >60 mL/min (ref >60)
Glucose, Bld: 98 mg/dL (ref 70–99)
Potassium: 3.8 mmol/L (ref 3.5–5.1)
Sodium: 138 mmol/L (ref 135–145)

## 2024-07-12 LAB — CBC
HCT: 47 % (ref 39.0–52.0)
Hemoglobin: 15.7 g/dL (ref 13.0–17.0)
MCH: 32.9 pg (ref 26.0–34.0)
MCHC: 33.4 g/dL (ref 30.0–36.0)
MCV: 98.5 fL (ref 80.0–100.0)
Platelets: 219 K/uL (ref 150–400)
RBC: 4.77 MIL/uL (ref 4.22–5.81)
RDW: 12.8 % (ref 11.5–15.5)
WBC: 8.2 K/uL (ref 4.0–10.5)
nRBC: 0 % (ref 0.0–0.2)

## 2024-07-12 SURGERY — EXCISION MASS LOWER EXTREMITIES
Anesthesia: General | Laterality: Right

## 2024-07-12 MED ORDER — PROPOFOL 10 MG/ML IV BOLUS
INTRAVENOUS | Status: AC
  Filled 2024-07-12: qty 20

## 2024-07-12 MED ORDER — ONDANSETRON HCL 4 MG/2ML IJ SOLN
INTRAMUSCULAR | Status: AC
  Filled 2024-07-12: qty 2

## 2024-07-12 MED ORDER — DEXAMETHASONE SODIUM PHOSPHATE 10 MG/ML IJ SOLN
INTRAMUSCULAR | Status: DC | PRN
  Administered 2024-07-12: 8 mg via INTRAVENOUS

## 2024-07-12 MED ORDER — PHENYLEPHRINE 80 MCG/ML (10ML) SYRINGE FOR IV PUSH (FOR BLOOD PRESSURE SUPPORT)
PREFILLED_SYRINGE | INTRAVENOUS | Status: DC | PRN
Start: 2024-07-12 — End: 2024-07-12
  Administered 2024-07-12: 80 ug via INTRAVENOUS
  Administered 2024-07-12: 280 ug via INTRAVENOUS
  Administered 2024-07-12: 120 ug via INTRAVENOUS

## 2024-07-12 MED ORDER — FENTANYL CITRATE (PF) 250 MCG/5ML IJ SOLN
INTRAMUSCULAR | Status: DC | PRN
  Administered 2024-07-12: 75 ug via INTRAVENOUS
  Administered 2024-07-12: 100 ug via INTRAVENOUS

## 2024-07-12 MED ORDER — MIDAZOLAM HCL 2 MG/2ML IJ SOLN
INTRAMUSCULAR | Status: AC
  Filled 2024-07-12: qty 2

## 2024-07-12 MED ORDER — FENTANYL CITRATE (PF) 100 MCG/2ML IJ SOLN
INTRAMUSCULAR | Status: AC
  Filled 2024-07-12: qty 2

## 2024-07-12 MED ORDER — FENTANYL CITRATE (PF) 250 MCG/5ML IJ SOLN
INTRAMUSCULAR | Status: AC
  Filled 2024-07-12: qty 5

## 2024-07-12 MED ORDER — TRIAMCINOLONE ACETONIDE 40 MG/ML IJ SUSP
INTRAMUSCULAR | Status: DC | PRN
  Administered 2024-07-12: 200 mg via INTRAMUSCULAR

## 2024-07-12 MED ORDER — ROCURONIUM BROMIDE 10 MG/ML (PF) SYRINGE
PREFILLED_SYRINGE | INTRAVENOUS | Status: DC | PRN
Start: 2024-07-12 — End: 2024-07-12
  Administered 2024-07-12: 50 mg via INTRAVENOUS

## 2024-07-12 MED ORDER — EPHEDRINE 5 MG/ML INJ
INTRAVENOUS | Status: AC
  Filled 2024-07-12: qty 5

## 2024-07-12 MED ORDER — BUPIVACAINE-EPINEPHRINE 0.25% -1:200000 IJ SOLN
INTRAMUSCULAR | Status: DC | PRN
  Administered 2024-07-12: 10 mL

## 2024-07-12 MED ORDER — BUPIVACAINE HCL (PF) 0.25 % IJ SOLN
INTRAMUSCULAR | Status: DC | PRN
  Administered 2024-07-12: 30 mL via PERINEURAL

## 2024-07-12 MED ORDER — HYDROMORPHONE HCL 1 MG/ML IJ SOLN
INTRAMUSCULAR | Status: AC
  Filled 2024-07-12: qty 1

## 2024-07-12 MED ORDER — AMISULPRIDE (ANTIEMETIC) 5 MG/2ML IV SOLN
INTRAVENOUS | Status: AC
Start: 2024-07-12 — End: 2024-07-12
  Filled 2024-07-12: qty 4

## 2024-07-12 MED ORDER — ONDANSETRON HCL 4 MG/2ML IJ SOLN
INTRAMUSCULAR | Status: DC | PRN
  Administered 2024-07-12: 4 mg via INTRAVENOUS

## 2024-07-12 MED ORDER — EPHEDRINE SULFATE-NACL 50-0.9 MG/10ML-% IV SOSY
PREFILLED_SYRINGE | INTRAVENOUS | Status: DC | PRN
Start: 2024-07-12 — End: 2024-07-12
  Administered 2024-07-12: 10 mg via INTRAVENOUS

## 2024-07-12 MED ORDER — DEXAMETHASONE SODIUM PHOSPHATE 10 MG/ML IJ SOLN
INTRAMUSCULAR | Status: AC
Start: 2024-07-12 — End: 2024-07-12
  Filled 2024-07-12: qty 1

## 2024-07-12 MED ORDER — PROPOFOL 10 MG/ML IV BOLUS
INTRAVENOUS | Status: DC | PRN
  Administered 2024-07-12: 150 mg via INTRAVENOUS

## 2024-07-12 MED ORDER — OXYCODONE HCL 5 MG PO TABS: 5.0000 mg | ORAL_TABLET | Freq: Four times a day (QID) | ORAL | 0 refills | Status: DC | PRN

## 2024-07-12 MED ORDER — CHLORHEXIDINE GLUCONATE 0.12 % MT SOLN
15.0000 mL | Freq: Once | OROMUCOSAL | Status: AC
  Administered 2024-07-12: 15 mL via OROMUCOSAL
  Filled 2024-07-12: qty 15

## 2024-07-12 MED ORDER — BUPIVACAINE-EPINEPHRINE (PF) 0.25% -1:200000 IJ SOLN
INTRAMUSCULAR | Status: AC
  Filled 2024-07-12: qty 30

## 2024-07-12 MED ORDER — ENSURE PRE-SURGERY PO LIQD: 296.0000 mL | Freq: Once | ORAL | Status: DC

## 2024-07-12 MED ORDER — LIDOCAINE 2% (20 MG/ML) 5 ML SYRINGE
INTRAMUSCULAR | Status: DC | PRN
Start: 2024-07-12 — End: 2024-07-12
  Administered 2024-07-12: 60 mg via INTRAVENOUS

## 2024-07-12 MED ORDER — ACETAMINOPHEN 500 MG PO TABS
1000.0000 mg | ORAL_TABLET | ORAL | Status: AC
  Administered 2024-07-12: 1000 mg via ORAL
  Filled 2024-07-12: qty 2

## 2024-07-12 MED ORDER — FENTANYL CITRATE (PF) 100 MCG/2ML IJ SOLN
25.0000 ug | INTRAMUSCULAR | Status: DC | PRN
  Administered 2024-07-12 (×3): 50 ug via INTRAVENOUS

## 2024-07-12 MED ORDER — PHENYLEPHRINE 80 MCG/ML (10ML) SYRINGE FOR IV PUSH (FOR BLOOD PRESSURE SUPPORT)
PREFILLED_SYRINGE | INTRAVENOUS | Status: AC
Start: 2024-07-12 — End: 2024-07-12
  Filled 2024-07-12: qty 10

## 2024-07-12 MED ORDER — CEFAZOLIN SODIUM-DEXTROSE 2-4 GM/100ML-% IV SOLN
2.0000 g | INTRAVENOUS | Status: AC
  Administered 2024-07-12: 2 g via INTRAVENOUS
  Filled 2024-07-12: qty 100

## 2024-07-12 MED ORDER — CHLORHEXIDINE GLUCONATE CLOTH 2 % EX PADS
6.0000 | MEDICATED_PAD | Freq: Once | CUTANEOUS | Status: DC
Start: 2024-07-12 — End: 2024-07-12

## 2024-07-12 MED ORDER — ORAL CARE MOUTH RINSE: 15.0000 mL | Freq: Once | OROMUCOSAL | Status: AC

## 2024-07-12 MED ORDER — TRIAMCINOLONE ACETONIDE 40 MG/ML IJ SUSP
INTRAMUSCULAR | Status: AC
Start: 2024-07-12 — End: 2024-07-12
  Filled 2024-07-12: qty 5

## 2024-07-12 MED ORDER — SUGAMMADEX SODIUM 200 MG/2ML IV SOLN
INTRAVENOUS | Status: DC | PRN
Start: 2024-07-12 — End: 2024-07-12
  Administered 2024-07-12: 200 mg via INTRAVENOUS

## 2024-07-12 MED ORDER — HYDROMORPHONE HCL 1 MG/ML IJ SOLN
0.2500 mg | INTRAMUSCULAR | Status: DC | PRN
  Administered 2024-07-12 (×4): 0.5 mg via INTRAVENOUS

## 2024-07-12 MED ORDER — LIDOCAINE 2% (20 MG/ML) 5 ML SYRINGE
INTRAMUSCULAR | Status: AC
Start: 2024-07-12 — End: 2024-07-12
  Filled 2024-07-12: qty 5

## 2024-07-12 MED ORDER — OXYCODONE HCL 5 MG PO TABS
ORAL_TABLET | ORAL | Status: AC
  Filled 2024-07-12: qty 1

## 2024-07-12 MED ORDER — LACTATED RINGERS IV SOLN: INTRAVENOUS | Status: DC

## 2024-07-12 MED ORDER — METOCLOPRAMIDE HCL 10 MG PO TABS: 10.0000 mg | ORAL_TABLET | Freq: Four times a day (QID) | ORAL | 0 refills | Status: DC | PRN

## 2024-07-12 MED ORDER — ROCURONIUM BROMIDE 10 MG/ML (PF) SYRINGE
PREFILLED_SYRINGE | INTRAVENOUS | Status: AC
  Filled 2024-07-12: qty 10

## 2024-07-12 MED ORDER — OXYCODONE HCL 5 MG PO TABS
5.0000 mg | ORAL_TABLET | Freq: Once | ORAL | Status: AC | PRN
  Administered 2024-07-12: 5 mg via ORAL

## 2024-07-12 MED ORDER — AMISULPRIDE (ANTIEMETIC) 5 MG/2ML IV SOLN
10.0000 mg | Freq: Once | INTRAVENOUS | Status: AC | PRN
  Administered 2024-07-12: 10 mg via INTRAVENOUS

## 2024-07-12 MED ORDER — MIDAZOLAM HCL 2 MG/2ML IJ SOLN
INTRAMUSCULAR | Status: DC | PRN
  Administered 2024-07-12: 2 mg via INTRAVENOUS

## 2024-07-12 MED ORDER — OXYCODONE HCL 5 MG/5ML PO SOLN: 5.0000 mg | Freq: Once | ORAL | Status: AC | PRN

## 2024-07-12 SURGICAL SUPPLY — 27 items
BAG COUNTER SPONGE SURGICOUNT (BAG) ×1 IMPLANT
CANISTER SUCTION 3000ML PPV (SUCTIONS) IMPLANT
COVER SURGICAL LIGHT HANDLE (MISCELLANEOUS) ×1 IMPLANT
DERMABOND ADVANCED .7 DNX12 (GAUZE/BANDAGES/DRESSINGS) ×1 IMPLANT
DRAPE LAPAROSCOPIC ABDOMINAL (DRAPES) IMPLANT
DRAPE LAPAROTOMY 100X72 PEDS (DRAPES) IMPLANT
DRSG TEGADERM 4X4.75 (GAUZE/BANDAGES/DRESSINGS) ×1 IMPLANT
ELECTRODE REM PT RTRN 9FT ADLT (ELECTROSURGICAL) ×1 IMPLANT
GAUZE SPONGE 4X4 12PLY STRL (GAUZE/BANDAGES/DRESSINGS) ×1 IMPLANT
GLOVE SURG SIGNA 7.5 PF LTX (GLOVE) ×1 IMPLANT
GOWN STRL REUS W/ TWL LRG LVL3 (GOWN DISPOSABLE) ×1 IMPLANT
GOWN STRL REUS W/ TWL XL LVL3 (GOWN DISPOSABLE) ×1 IMPLANT
KIT BASIN OR (CUSTOM PROCEDURE TRAY) ×1 IMPLANT
KIT TURNOVER KIT B (KITS) ×1 IMPLANT
NDL HYPO 25GX1X1/2 BEV (NEEDLE) ×1 IMPLANT
NEEDLE HYPO 25GX1X1/2 BEV (NEEDLE) ×1 IMPLANT
NS IRRIG 1000ML POUR BTL (IV SOLUTION) ×1 IMPLANT
PACK GENERAL/GYN (CUSTOM PROCEDURE TRAY) ×1 IMPLANT
PAD ARMBOARD POSITIONER FOAM (MISCELLANEOUS) ×1 IMPLANT
PENCIL SMOKE EVACUATOR (MISCELLANEOUS) ×1 IMPLANT
SPECIMEN JAR SMALL (MISCELLANEOUS) ×1 IMPLANT
SUT ETHIBOND 0 36 GRN (SUTURE) IMPLANT
SUT MNCRL AB 4-0 PS2 18 (SUTURE) ×1 IMPLANT
SUT VIC AB 3-0 SH 27XBRD (SUTURE) ×1 IMPLANT
SYR CONTROL 10ML LL (SYRINGE) ×1 IMPLANT
TOWEL GREEN STERILE (TOWEL DISPOSABLE) ×1 IMPLANT
TOWEL GREEN STERILE FF (TOWEL DISPOSABLE) ×1 IMPLANT

## 2024-07-12 NOTE — Op Note (Signed)
   [name] 07/12/2024   Pre-op Diagnosis: CHRONIC RIGHT GROIN PAIN     Post-op Diagnosis: same  Procedure(s): RIGHT INGUINAL EXPLORATION EXCISION OF CHRONIC SCAR TISSUE, SUTURE MATERIAL  (3 CM X 2 CM )  Surgeon(s): Vernetta Berg, MD  Anesthesia: General  Staff:  Circulator: Prentiss Shona NOVAK, RN Scrub Person: Primus Leita HERO, RN; Orvil Asberry POUR  Estimated Blood Loss: Minimal               Specimens: SENT TO PATH  Indications: This is a 63 year old gentleman who has had chronic pain in the right inguinal area status post open right inguinal hernia pair with mesh.  He has had to have his mesh removed and has even had an orchiectomy and remove the testicular cord on that side.  He has had previous groin exploration with removal of permanent sutures that were causing discomfort.  He has been having worsening discomfort in the lateralmost aspect of his incision toward the anterior superior iliac spine which does improve with injections of Marcaine  and lidocaine .  He has had a CT and MRI which shows only mild scarring in this area with no evidence of recurrent hernia.  The decision was made after long discussion to proceed to the operating room to explore the groin in hopes of relieving his pain  Findings: The patient was found to have an extensive hard scar at the lateralmost aspect of his incision.  There was old suture material present and potentially some mesh which was completely excised in an area measuring 3 cm x 2 cm and this was sent to pathology for evaluation.  I also excised what appeared to be a remanent of the ilioinguinal nerve.  Procedure: The patient was brought to the operating identifies correct patient.  He was placed upon the operating table and general anesthesia was induced.  His abdomen was prepped and draped in usual sterile fashion.  I anesthetized the skin the lateral part of his incision at the right groin near the anterior superior iliac spine with  Marcaine .  I then made a longitudinal incision with scalpel.  I dissected down to subcutaneous tissue with electrocautery.  He had extensive hard scar in this area going down to the fascia.  As I was excising the scar I could see visible suture material.  There Alperin have been a small remanent of mesh was excised with the scar.  The fibrous scar tissue containing the suture material measured approximately 3 cm x 2 cm and was sent to pathology for evaluation.  I also identified what appeared to be potential remanent of the ilioinguinal nerve which I excised as well.  I saw no other abnormalities.  I injected Kenalog  into the surrounding tissue.  I also injected it further with Marcaine .  I then loosely approximated the fascia with 2 separate 0 Ethibond sutures.  This was on the medial aspect of the incision at the fascia.  I then closed the subcutaneous tissue with interrupted 3-0 Vicryl sutures and closed the skin with a running 4-0 Monocryl.  Dermabond was then applied.  The patient tolerated the procedure well.  All the counts were correct at the end the procedure.  The patient was then extubated in the operating room and taken in a stable condition to the recovery room.          Berg Vernetta   Date: 07/12/2024  Time: 8:04 AM

## 2024-07-12 NOTE — Anesthesia Postprocedure Evaluation (Signed)
 Anesthesia Post Note  Patient: [name]  Procedure(s) Performed: RIGHT INGUINAL EXPLORATION (Right)     Patient location during evaluation: PACU Anesthesia Type: General Level of consciousness: awake Pain management: pain level controlled Vital Signs Assessment: post-procedure vital signs reviewed and stable Respiratory status: spontaneous breathing, nonlabored ventilation and respiratory function stable Cardiovascular status: blood pressure returned to baseline and stable Postop Assessment: no apparent nausea or vomiting Anesthetic complications: no   No notable events documented.  Last Vitals:  Vitals:   07/12/24 0945 07/12/24 1000  BP: (!) 125/91 (!) 141/83  Pulse: (!) 54 60  Resp: 10 13  Temp:  36.4 C  SpO2: 92% 95%    Last Pain:  Vitals:   07/12/24 1000  TempSrc:   PainSc: 3                  Delon Aisha Arch

## 2024-07-12 NOTE — Anesthesia Procedure Notes (Signed)
 Procedure Name: Intubation Date/Time: 07/12/2024 7:29 AM  Performed by: Roslynn Waddell LABOR, CRNAPre-anesthesia Checklist: Patient identified, Emergency Drugs available, Suction available and Patient being monitored Patient Re-evaluated:Patient Re-evaluated prior to induction Oxygen Delivery Method: Circle System Utilized Preoxygenation: Pre-oxygenation with 100% oxygen Induction Type: IV induction Ventilation: Mask ventilation without difficulty Laryngoscope Size: Glidescope and 3 Tube type: Oral Tube size: 7.0 mm Number of attempts: 1 Airway Equipment and Method: Stylet and Oral airway Placement Confirmation: ETT inserted through vocal cords under direct vision, positive ETCO2 and breath sounds checked- equal and bilateral Secured at: 23 cm Tube secured with: Tape Dental Injury: Teeth and Oropharynx as per pre-operative assessment  Comments: Atraumatic induction/intubation. Dentition and oral mucosa as per preop.

## 2024-07-12 NOTE — Anesthesia Procedure Notes (Signed)
 Anesthesia Regional Block: TAP block   Pre-Anesthetic Checklist: , timeout performed,  Correct Patient, Correct Site, Correct Laterality,  Correct Procedure, Correct Position, site marked,  Risks and benefits discussed,  Surgical consent,  Pre-op evaluation,  At surgeon's request and post-op pain management  Laterality: Right  Prep: chloraprep       Needles:  Injection technique: Single-shot  Needle Type: Echogenic Stimulator Needle     Needle Length: 9cm  Needle Gauge: 21     Additional Needles:   Procedures:,,,, ultrasound used (permanent image in chart),,    Narrative:  Start time: 07/12/2024 6:58 AM End time: 07/12/2024 7:03 AM Injection made incrementally with aspirations every 5 mL.  Performed by: Personally  Anesthesiologist: Peggye Delon Brunswick, MD  Additional Notes: Discussed risks and benefits of nerve block including, but not limited to, prolonged and/or permanent nerve injury involving sensory and/or motor function. Monitors were applied and a time-out was performed. The nerve and associated structures were visualized under ultrasound guidance. After negative aspiration, local anesthetic was slowly injected around the nerve. There was no evidence of high pressure during the procedure. There were no paresthesias. VSS remained stable and the patient tolerated the procedure well.

## 2024-07-12 NOTE — Discharge Instructions (Signed)
 You Perdew shower starting tomorrow  No lifting over 10 pounds for the next 2 weeks  Ice pack and exercising Tylenol  also for pain

## 2024-07-12 NOTE — Interval H&P Note (Signed)
 History and Physical Interval Note:no change in H and P  07/12/2024 6:45 AM  [name]  has presented today for surgery, with the diagnosis of CHRONIC RIGHT GROIN PAIN.  The various methods of treatment have been discussed with the patient and family. After consideration of risks, benefits and other options for treatment, the patient has consented to  Procedure(s) with comments: EXCISION MASS LOWER EXTREMITIES (Right) - RIGHT GROIN EXPLORATION as a surgical intervention.  The patient's history has been reviewed, patient examined, no change in status, stable for surgery.  I have reviewed the patient's chart and labs.  Questions were answered to the patient's satisfaction.     Vicenta Poli

## 2024-07-12 NOTE — Transfer of Care (Signed)
 Immediate Anesthesia Transfer of Care Note  Patient: [name]  Procedure(s) Performed: RIGHT INGUINAL EXPLORATION (Right)  Patient Location: PACU  Anesthesia Type:General  Level of Consciousness: awake and alert   Airway & Oxygen Therapy: Patient Spontanous Breathing and Patient connected to face mask oxygen  Post-op Assessment: Report given to RN and Post -op Vital signs reviewed and stable  Post vital signs: Reviewed and stable  Last Vitals:  Vitals Value Taken Time  BP 131/79 07/12/24 08:13  Temp    Pulse 83 07/12/24 08:14  Resp 22 07/12/24 08:14  SpO2 96 % 07/12/24 08:14  Vitals shown include unfiled device data.  Last Pain:  Vitals:   07/12/24 0619  TempSrc:   PainSc: 5          Complications: No notable events documented.

## 2024-07-15 ENCOUNTER — Encounter (HOSPITAL_COMMUNITY): Payer: Self-pay | Admitting: Surgery

## 2024-07-15 LAB — SURGICAL PATHOLOGY

## 2024-07-23 ENCOUNTER — Other Ambulatory Visit: Payer: Self-pay | Admitting: Urology

## 2024-07-23 MED ORDER — DIAZEPAM 10 MG PO TABS
10.0000 mg | ORAL_TABLET | Freq: Once | ORAL | 0 refills | Status: DC | PRN
Start: 2024-07-23 — End: 2024-08-07

## 2024-08-07 ENCOUNTER — Ambulatory Visit (INDEPENDENT_AMBULATORY_CARE_PROVIDER_SITE_OTHER): Admitting: Urology

## 2024-08-07 VITALS — BP 100/67 | HR 83

## 2024-08-07 DIAGNOSIS — R310 Gross hematuria: Secondary | ICD-10-CM | POA: Diagnosis not present

## 2024-08-07 MED ORDER — LIDOCAINE HCL URETHRAL/MUCOSAL 2 % EX GEL
1.0000 | Freq: Once | CUTANEOUS | Status: AC
  Administered 2024-08-07: 1 via URETHRAL

## 2024-08-07 MED ORDER — CEPHALEXIN 250 MG PO CAPS
500.0000 mg | ORAL_CAPSULE | Freq: Once | ORAL | Status: AC
  Administered 2024-08-07: 500 mg via ORAL

## 2024-08-07 NOTE — Progress Notes (Signed)
 Cystoscopy Procedure Note:  Indication: Microscopic hematuria  After informed consent and discussion of the procedure and its risks, [name] was positioned and prepped in the standard fashion. Cystoscopy was performed with a flexible cystoscope. The urethra, bladder neck and entire bladder was visualized in a standard fashion. The prostate was very large with obstructing lateral lobes, large median lobe. The ureteral orifices were visualized in their normal location and orientation.  Mild trabeculations, no suspicious lesions, large median lobe on retroflexion.  Imaging: CT June 2025 with no urologic abnormalities, enlarged prostate  Findings: Enlarged prostate, no worrisome findings  Assessment and Plan: Continue Flomax and finasteride for maximal medical therapy, he is not interested in outlet procedures at this time Keep yearly follow-up as scheduled for PVR  Brett Burnet, MD 08/07/2024

## 2024-08-30 ENCOUNTER — Other Ambulatory Visit: Payer: Self-pay | Admitting: Surgery

## 2024-08-30 DIAGNOSIS — R1031 Right lower quadrant pain: Secondary | ICD-10-CM

## 2024-09-02 ENCOUNTER — Ambulatory Visit
Admission: RE | Admit: 2024-09-02 | Discharge: 2024-09-02 | Disposition: A | Discharge status: Home / Self Care | Source: Ambulatory Visit | Attending: Surgery | Admitting: Surgery

## 2024-09-02 DIAGNOSIS — R1031 Right lower quadrant pain: Secondary | ICD-10-CM | POA: Insufficient documentation

## 2024-09-02 DIAGNOSIS — N136 Pyonephrosis: Secondary | ICD-10-CM | POA: Diagnosis not present

## 2024-09-02 MED ORDER — IOHEXOL 300 MG/ML  SOLN
100.0000 mL | Freq: Once | INTRAMUSCULAR | Status: AC | PRN
  Administered 2024-09-02: 100 mL via INTRAVENOUS

## 2024-09-03 ENCOUNTER — Other Ambulatory Visit: Payer: Self-pay

## 2024-09-03 ENCOUNTER — Inpatient Hospital Stay

## 2024-09-03 ENCOUNTER — Inpatient Hospital Stay
Admission: EM | Admit: 2024-09-03 | Discharge: 2024-09-04 | DRG: 661 | Disposition: A | Discharge status: Home / Self Care | Attending: Student | Admitting: Student

## 2024-09-03 ENCOUNTER — Inpatient Hospital Stay: Admitting: Certified Registered"

## 2024-09-03 ENCOUNTER — Telehealth: Payer: Self-pay

## 2024-09-03 ENCOUNTER — Encounter: Admission: EM | Disposition: A | Payer: Self-pay | Source: Home / Self Care | Attending: Internal Medicine

## 2024-09-03 DIAGNOSIS — N136 Pyonephrosis: Secondary | ICD-10-CM | POA: Diagnosis present

## 2024-09-03 DIAGNOSIS — Z8616 Personal history of COVID-19: Secondary | ICD-10-CM | POA: Diagnosis not present

## 2024-09-03 DIAGNOSIS — Z9079 Acquired absence of other genital organ(s): Secondary | ICD-10-CM

## 2024-09-03 DIAGNOSIS — N400 Enlarged prostate without lower urinary tract symptoms: Secondary | ICD-10-CM | POA: Diagnosis present

## 2024-09-03 DIAGNOSIS — Z833 Family history of diabetes mellitus: Secondary | ICD-10-CM

## 2024-09-03 DIAGNOSIS — N200 Calculus of kidney: Principal | ICD-10-CM

## 2024-09-03 DIAGNOSIS — Z7982 Long term (current) use of aspirin: Secondary | ICD-10-CM

## 2024-09-03 DIAGNOSIS — J439 Emphysema, unspecified: Secondary | ICD-10-CM | POA: Diagnosis present

## 2024-09-03 DIAGNOSIS — F17210 Nicotine dependence, cigarettes, uncomplicated: Secondary | ICD-10-CM | POA: Diagnosis present

## 2024-09-03 DIAGNOSIS — N390 Urinary tract infection, site not specified: Secondary | ICD-10-CM | POA: Insufficient documentation

## 2024-09-03 DIAGNOSIS — Z888 Allergy status to other drugs, medicaments and biological substances status: Secondary | ICD-10-CM | POA: Diagnosis not present

## 2024-09-03 DIAGNOSIS — Z885 Allergy status to narcotic agent status: Secondary | ICD-10-CM

## 2024-09-03 DIAGNOSIS — N132 Hydronephrosis with renal and ureteral calculous obstruction: Secondary | ICD-10-CM | POA: Diagnosis present

## 2024-09-03 DIAGNOSIS — Z79899 Other long term (current) drug therapy: Secondary | ICD-10-CM

## 2024-09-03 DIAGNOSIS — K219 Gastro-esophageal reflux disease without esophagitis: Secondary | ICD-10-CM | POA: Diagnosis present

## 2024-09-03 HISTORY — PX: CYSTOSCOPY WITH STENT PLACEMENT: SHX5790

## 2024-09-03 HISTORY — DX: Disorder of kidney and ureter, unspecified: N28.9

## 2024-09-03 LAB — CBC
HCT: 44.2 % (ref 39.0–52.0)
Hemoglobin: 15.3 g/dL (ref 13.0–17.0)
MCH: 33.7 pg (ref 26.0–34.0)
MCHC: 34.6 g/dL (ref 30.0–36.0)
MCV: 97.4 fL (ref 80.0–100.0)
Platelets: 316 K/uL (ref 150–400)
RBC: 4.54 MIL/uL (ref 4.22–5.81)
RDW: 12.9 % (ref 11.5–15.5)
WBC: 10.4 K/uL (ref 4.0–10.5)
nRBC: 0 % (ref 0.0–0.2)

## 2024-09-03 LAB — URINALYSIS, ROUTINE W REFLEX MICROSCOPIC
Bilirubin Urine: NEGATIVE
Glucose, UA: NEGATIVE mg/dL
Hgb urine dipstick: NEGATIVE
Ketones, ur: NEGATIVE mg/dL
Nitrite: NEGATIVE
Protein, ur: NEGATIVE mg/dL
Specific Gravity, Urine: 1.02 (ref 1.005–1.030)
Squamous Epithelial / HPF: 0 /HPF (ref 0–5)
pH: 6 (ref 5.0–8.0)

## 2024-09-03 LAB — BASIC METABOLIC PANEL WITH GFR
Anion gap: 11 (ref 5–15)
BUN: 15 mg/dL (ref 8–23)
CO2: 25 mmol/L (ref 22–32)
Calcium: 9.1 mg/dL (ref 8.9–10.3)
Chloride: 103 mmol/L (ref 98–111)
Creatinine, Ser: 1.11 mg/dL (ref 0.61–1.24)
GFR, Estimated: >60 mL/min (ref >60)
Glucose, Bld: 116 mg/dL — ABNORMAL HIGH (ref 70–99)
Potassium: 3.7 mmol/L (ref 3.5–5.1)
Sodium: 139 mmol/L (ref 135–145)

## 2024-09-03 LAB — HIV ANTIBODY (ROUTINE TESTING W REFLEX): HIV Screen 4th Generation wRfx: NONREACTIVE

## 2024-09-03 SURGERY — CYSTOSCOPY, WITH STENT INSERTION
Anesthesia: General | Site: Ureter | Laterality: Right

## 2024-09-03 MED ORDER — PROPOFOL 10 MG/ML IV BOLUS
INTRAVENOUS | Status: DC | PRN
  Administered 2024-09-03: 200 mg via INTRAVENOUS

## 2024-09-03 MED ORDER — FENTANYL CITRATE (PF) 100 MCG/2ML IJ SOLN
INTRAMUSCULAR | Status: AC
  Filled 2024-09-03: qty 2

## 2024-09-03 MED ORDER — OXYBUTYNIN CHLORIDE 5 MG PO TABS
5.0000 mg | ORAL_TABLET | Freq: Three times a day (TID) | ORAL | Status: DC | PRN
Start: 2024-09-03 — End: 2024-09-04
  Administered 2024-09-04: 5 mg via ORAL
  Filled 2024-09-03 (×2): qty 1

## 2024-09-03 MED ORDER — GABAPENTIN 300 MG PO CAPS
300.0000 mg | ORAL_CAPSULE | Freq: Two times a day (BID) | ORAL | Status: DC
  Filled 2024-09-03 (×2): qty 1

## 2024-09-03 MED ORDER — SUGAMMADEX SODIUM 200 MG/2ML IV SOLN
INTRAVENOUS | Status: DC | PRN
  Administered 2024-09-03: 200 mg via INTRAVENOUS

## 2024-09-03 MED ORDER — FENTANYL CITRATE (PF) 100 MCG/2ML IJ SOLN
25.0000 ug | INTRAMUSCULAR | Status: DC | PRN
  Administered 2024-09-03: 25 ug via INTRAVENOUS

## 2024-09-03 MED ORDER — OXYCODONE HCL 5 MG PO TABS: 5.0000 mg | ORAL_TABLET | Freq: Once | ORAL | Status: DC | PRN

## 2024-09-03 MED ORDER — HYDROMORPHONE HCL 1 MG/ML IJ SOLN
1.0000 mg | Freq: Once | INTRAMUSCULAR | Status: AC
  Administered 2024-09-03: 1 mg via INTRAVENOUS
  Filled 2024-09-03: qty 1

## 2024-09-03 MED ORDER — SODIUM CHLORIDE 0.9 % IV SOLN
1.0000 g | Freq: Once | INTRAVENOUS | Status: AC
  Administered 2024-09-03: 1 g via INTRAVENOUS
  Filled 2024-09-03: qty 10

## 2024-09-03 MED ORDER — EPHEDRINE SULFATE-NACL 50-0.9 MG/10ML-% IV SOSY
PREFILLED_SYRINGE | INTRAVENOUS | Status: DC | PRN
  Administered 2024-09-03: 5 mg via INTRAVENOUS

## 2024-09-03 MED ORDER — ASPIRIN 81 MG PO TBEC
81.0000 mg | DELAYED_RELEASE_TABLET | Freq: Every day | ORAL | Status: DC
  Administered 2024-09-04: 81 mg via ORAL
  Filled 2024-09-03: qty 1

## 2024-09-03 MED ORDER — PANTOPRAZOLE SODIUM 40 MG PO TBEC
40.0000 mg | DELAYED_RELEASE_TABLET | Freq: Every day | ORAL | Status: DC
Start: 2024-09-04 — End: 2024-09-04
  Administered 2024-09-04: 40 mg via ORAL
  Filled 2024-09-03: qty 1

## 2024-09-03 MED ORDER — ONDANSETRON HCL 4 MG/2ML IJ SOLN
4.0000 mg | Freq: Once | INTRAMUSCULAR | Status: DC
  Filled 2024-09-03: qty 2

## 2024-09-03 MED ORDER — ONDANSETRON HCL 4 MG/2ML IJ SOLN
INTRAMUSCULAR | Status: DC | PRN
  Administered 2024-09-03 (×2): 4 mg via INTRAVENOUS

## 2024-09-03 MED ORDER — SUCCINYLCHOLINE CHLORIDE 200 MG/10ML IV SOSY
PREFILLED_SYRINGE | INTRAVENOUS | Status: DC | PRN
  Administered 2024-09-03: 100 mg via INTRAVENOUS

## 2024-09-03 MED ORDER — SODIUM CHLORIDE 0.9 % IR SOLN
Status: DC | PRN
  Administered 2024-09-03: 3000 mL

## 2024-09-03 MED ORDER — PROPOFOL 10 MG/ML IV BOLUS
INTRAVENOUS | Status: AC
Start: 2024-09-03 — End: 2024-09-03
  Filled 2024-09-03: qty 20

## 2024-09-03 MED ORDER — ROCURONIUM BROMIDE 100 MG/10ML IV SOLN
INTRAVENOUS | Status: DC | PRN
  Administered 2024-09-03: 10 mg via INTRAVENOUS
  Administered 2024-09-03: 30 mg via INTRAVENOUS

## 2024-09-03 MED ORDER — PHENYLEPHRINE 80 MCG/ML (10ML) SYRINGE FOR IV PUSH (FOR BLOOD PRESSURE SUPPORT)
PREFILLED_SYRINGE | INTRAVENOUS | Status: DC | PRN
  Administered 2024-09-03: 160 ug via INTRAVENOUS

## 2024-09-03 MED ORDER — ACETAMINOPHEN 10 MG/ML IV SOLN
INTRAVENOUS | Status: DC | PRN
  Administered 2024-09-03: 1000 mg via INTRAVENOUS

## 2024-09-03 MED ORDER — TAMSULOSIN HCL 0.4 MG PO CAPS
0.4000 mg | ORAL_CAPSULE | Freq: Every day | ORAL | Status: DC
  Administered 2024-09-04: 0.4 mg via ORAL
  Filled 2024-09-03: qty 1

## 2024-09-03 MED ORDER — MORPHINE SULFATE (PF) 4 MG/ML IV SOLN
4.0000 mg | Freq: Once | INTRAVENOUS | Status: AC
  Administered 2024-09-03: 4 mg via INTRAVENOUS
  Filled 2024-09-03: qty 1

## 2024-09-03 MED ORDER — LIDOCAINE HCL (CARDIAC) PF 100 MG/5ML IV SOSY
PREFILLED_SYRINGE | INTRAVENOUS | Status: DC | PRN
  Administered 2024-09-03: 100 mg via INTRAVENOUS

## 2024-09-03 MED ORDER — VASOPRESSIN 20 UNIT/ML IV SOLN
INTRAVENOUS | Status: DC | PRN
  Administered 2024-09-03: 2 [IU] via INTRAVENOUS

## 2024-09-03 MED ORDER — CLOTRIMAZOLE 10 MG MT TROC
10.0000 mg | Freq: Every day | OROMUCOSAL | Status: DC
Start: 2024-09-03 — End: 2024-09-04
  Administered 2024-09-03 – 2024-09-04 (×4): 10 mg via ORAL
  Filled 2024-09-03 (×7): qty 1

## 2024-09-03 MED ORDER — FENTANYL CITRATE (PF) 50 MCG/ML IJ SOSY
100.0000 ug | PREFILLED_SYRINGE | Freq: Once | INTRAMUSCULAR | Status: AC
  Administered 2024-09-03: 100 ug via INTRAVENOUS
  Filled 2024-09-03: qty 2

## 2024-09-03 MED ORDER — SODIUM CHLORIDE 0.9 % IV SOLN
12.5000 mg | Freq: Four times a day (QID) | INTRAVENOUS | Status: DC | PRN
  Filled 2024-09-03: qty 0.5

## 2024-09-03 MED ORDER — DEXMEDETOMIDINE HCL IN NACL 200 MCG/50ML IV SOLN
INTRAVENOUS | Status: DC | PRN
  Administered 2024-09-03: 12 ug via INTRAVENOUS

## 2024-09-03 MED ORDER — HYDROMORPHONE HCL 1 MG/ML IJ SOLN
0.5000 mg | INTRAMUSCULAR | Status: DC | PRN
  Administered 2024-09-03: 0.5 mg via INTRAVENOUS
  Filled 2024-09-03: qty 0.5

## 2024-09-03 MED ORDER — OXYCODONE HCL 5 MG/5ML PO SOLN: 5.0000 mg | Freq: Once | ORAL | Status: DC | PRN

## 2024-09-03 MED ORDER — SODIUM CHLORIDE 0.9 % IV BOLUS
1000.0000 mL | Freq: Once | INTRAVENOUS | Status: AC
  Administered 2024-09-03: 1000 mL via INTRAVENOUS

## 2024-09-03 MED ORDER — DEXAMETHASONE SOD PHOSPHATE PF 10 MG/ML IJ SOLN
INTRAMUSCULAR | Status: DC | PRN
  Administered 2024-09-03: 10 mg via INTRAVENOUS

## 2024-09-03 MED ORDER — FENTANYL CITRATE (PF) 100 MCG/2ML IJ SOLN
INTRAMUSCULAR | Status: DC | PRN
  Administered 2024-09-03 (×2): 50 ug via INTRAVENOUS

## 2024-09-03 MED ORDER — OXYCODONE HCL 5 MG PO TABS
5.0000 mg | ORAL_TABLET | Freq: Four times a day (QID) | ORAL | Status: DC | PRN
  Administered 2024-09-03 – 2024-09-04 (×2): 5 mg via ORAL
  Filled 2024-09-03 (×2): qty 1

## 2024-09-03 MED ORDER — SODIUM CHLORIDE 0.9 % IV SOLN
12.5000 mg | Freq: Once | INTRAVENOUS | Status: AC
  Administered 2024-09-03: 12.5 mg via INTRAVENOUS
  Filled 2024-09-03: qty 12.5

## 2024-09-03 MED ORDER — SODIUM CHLORIDE 0.9 % IV SOLN
1.0000 g | INTRAVENOUS | Status: DC
  Administered 2024-09-04: 1 g via INTRAVENOUS
  Filled 2024-09-03: qty 10

## 2024-09-03 MED ORDER — GLYCOPYRROLATE 0.2 MG/ML IJ SOLN
INTRAMUSCULAR | Status: DC | PRN
  Administered 2024-09-03: .2 mg via INTRAVENOUS

## 2024-09-03 MED ORDER — FINASTERIDE 5 MG PO TABS
5.0000 mg | ORAL_TABLET | Freq: Every day | ORAL | Status: DC
  Administered 2024-09-04: 5 mg via ORAL
  Filled 2024-09-03: qty 1

## 2024-09-03 MED ORDER — HYDROMORPHONE HCL 1 MG/ML IJ SOLN
0.5000 mg | Freq: Once | INTRAMUSCULAR | Status: AC
  Administered 2024-09-03: 0.5 mg via INTRAVENOUS
  Filled 2024-09-03: qty 0.5

## 2024-09-03 MED ORDER — PROPOFOL 10 MG/ML IV BOLUS
INTRAVENOUS | Status: AC
  Filled 2024-09-03: qty 20

## 2024-09-03 MED ORDER — SODIUM CHLORIDE 0.9 % IV SOLN: INTRAVENOUS | Status: DC | PRN

## 2024-09-03 MED ORDER — METOCLOPRAMIDE HCL 5 MG/ML IJ SOLN: 5.0000 mg | Freq: Four times a day (QID) | INTRAMUSCULAR | Status: DC | PRN

## 2024-09-03 MED ORDER — IOHEXOL 180 MG/ML  SOLN
INTRAMUSCULAR | Status: DC | PRN
  Administered 2024-09-03: 10 mL

## 2024-09-03 MED ORDER — SENNOSIDES-DOCUSATE SODIUM 8.6-50 MG PO TABS: 1.0000 | ORAL_TABLET | Freq: Every evening | ORAL | Status: DC | PRN

## 2024-09-03 SURGICAL SUPPLY — 15 items
BAG DRAIN SIEMENS DORNER NS (MISCELLANEOUS) ×1 IMPLANT
BRUSH SCRUB EZ 4% CHG (MISCELLANEOUS) ×1 IMPLANT
CATH URETL OPEN END 6X70 (CATHETERS) ×1 IMPLANT
CNTNR URN SCR LID CUP LEK RST (MISCELLANEOUS) IMPLANT
GLOVE BIOGEL PI IND STRL 7.5 (GLOVE) ×1 IMPLANT
GOWN STRL REUS W/ TWL XL LVL3 (GOWN DISPOSABLE) ×1 IMPLANT
GUIDEWIRE STR DUAL SENSOR (WIRE) ×1 IMPLANT
KIT TURNOVER CYSTO (KITS) ×1 IMPLANT
PACK CYSTO AR (MISCELLANEOUS) ×1 IMPLANT
SET CYSTO IRRIGATION (SET/KITS/TRAYS/PACK) ×1 IMPLANT
SOL .9 NS 3000ML IRR UROMATIC (IV SOLUTION) ×1 IMPLANT
STENT URET 6FRX24 CONTOUR (STENTS) IMPLANT
STENT URET 6FRX26 CONTOUR (STENTS) IMPLANT
SURGILUBE 2OZ TUBE FLIPTOP (MISCELLANEOUS) ×1 IMPLANT
WATER STERILE IRR 500ML POUR (IV SOLUTION) ×1 IMPLANT

## 2024-09-03 NOTE — Plan of Care (Signed)
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
## 2024-09-03 NOTE — Op Note (Signed)
    Preoperative diagnosis:  Right proximal ureteral calculus Suspected UTI  Postoperative diagnosis:  Right proximal ureteral calculus with high-grade obstruction Suspected UTI  Procedure:  Cystoscopy Right ureteral stent placement (55F/24 cm) Right retrograde pyelography with interpretation  Surgeon: Glendia C. Micheal Murad, M.D.  Anesthesia: General  Complications: None  Intraoperative findings:  Cystoscopy: Urethra normal in caliber without stricture.  Prominent lateral lobe enlargement.  Intravesical median lobe with inflammatory changes. Right retrograde pyelogram: Moderate right hydronephrosis  EBL: Minimal  Specimens: Urine right renal pelvis for C&S  Indication: [name] is a 63 y.o.male with severe renal colic, suspected UTI due to an obstructing 8 mm right proximal ureteral calculus.  Refer to the consultation note for details.  After reviewing the management options for treatment, he elected to proceed with the above surgical procedure(s). We have discussed the potential benefits and risks of the procedure, side effects of the proposed treatment, the likelihood of the patient achieving the goals of the procedure, and any potential problems that might occur during the procedure or recuperation. Informed consent has been obtained.  Description of procedure:  The patient was taken to the operating room and general anesthesia was induced.  The patient was placed in the dorsal lithotomy position, prepped and draped in the usual sterile fashion, and preoperative antibiotics were administered. A preoperative time-out was performed.   A 21 French scope was lubricated, placed per urethra and advanced into the bladder under direct vision with findings as described above.  Attention was directed to the right ureteral orifice and a 0.038 Sensor guidewire was then advanced up the ureter into the renal pelvis under fluoroscopic guidance.  A 55F open-ended ureteral catheter was then  advanced over the guidewire under fluoroscopic guidance to the region of the right renal pelvis.  10 mL of grossly clear urine was aspirated and sent for culture.  The guidewire was replaced followed by ureteral catheter removal.    A 55F/24 cm Contour ureteral stent was advanced over the guidewire.  The stent was positioned appropriately under fluoroscopic and cystoscopic guidance.  The wire was then removed with an adequate stent curl noted in the renal pelvis as well as in the bladder.  The bladder was then emptied and the procedure ended.  The patient appeared to tolerate the procedure well and without complications.  After anesthetic reversal the patient was transported to the PACU in stable condition.  Plan: He has been admitted to the hospitalist service for IV antibiotics Definitive stone treatment ~2 weeks   Glendia Barba, MD
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
## 2024-09-03 NOTE — ED Notes (Signed)
 PARAMEDIC ASHLEY INFORMED OF BED ASSIGNED

## 2024-09-03 NOTE — Telephone Encounter (Signed)
 pt's wife LVM on the triage line stating the pt had a CT scan and blood work done yesterday. Pt has a 8mm right kidney stone, elevated creatinine levels and some infection. pt is in a lot of pain this morning. Secure chat message sent to Dr.Sninsky.

## 2024-09-03 NOTE — ED Notes (Signed)
 Pt bed assigned and ready however pt does not want a shared room. Bed reassigned at this time.

## 2024-09-03 NOTE — Progress Notes (Signed)
 CHG and patient in gown prior to procedure.

## 2024-09-03 NOTE — ED Notes (Signed)
 Pt and wife are upset because we have yet to get his pain under control with the medications given. Pt's wife states that she does not understand why urology can not come down and take the stone out. Pt expressed that he would rather go home and be in pain. Admitting doctor made aware of same.

## 2024-09-03 NOTE — ED Triage Notes (Signed)
 Pt to ED for R flank pain since 2 weeks ago. Had CT and bloodwork yesterday showing 8mm renal stone and also CR and WBC were elevated. Hx renal stones. Pain is from R lower back to R groin.
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
## 2024-09-03 NOTE — ED Provider Notes (Signed)
 [facility] Provider Note    Event Date/Time   First MD Initiated Contact with Patient 09/03/24 [phone]     (approximate)  History   Chief Complaint: Flank Pain  HPI  Dartanion Teo Sherr is a 63 y.o. male with a past medical history of gastric reflux, multiple kidney stones, presents to the emergency department for right flank pain.  According to the patient for the last 3 weeks he has been experiencing intermittent pain in the right flank that has progressively gotten worse.  Patient states he had an inguinal surgery repair back in September thought the pain was related to that but it has failed to improve.  Patient saw his doctor who did lab work per patient showed elevated white blood cell count (not available in our system or to the patient online) and was concerning for urinary tract infection the patient was started on ciprofloxacin.  Patient had an outpatient CT scan ordered yesterday which was completed showing an 8 mm mid ureteral right-sided stone with concern for possible superimposed infection.  Patient was called back today and told to go to the emergency department for further workup and treatment.  Patient states he has had 40 kidney stones including this 1 all of which have passed on their own.  Physical Exam   Triage Vital Signs: ED Triage Vitals  Encounter Vitals Group     BP 09/03/24 0848 (!) 148/98     Girls Systolic BP Percentile --      Girls Diastolic BP Percentile --      Boys Systolic BP Percentile --      Boys Diastolic BP Percentile --      Pulse Rate 09/03/24 0848 97     Resp 09/03/24 0848 20     Temp 09/03/24 0848 97.7 F (36.5 C)     Temp Source 09/03/24 0848 Oral     SpO2 09/03/24 0848 97 %     Weight 09/03/24 0846 170 lb (77.1 kg)     Height 09/03/24 0846 6' (1.829 m)     Head Circumference --      Peak Flow --      Pain Score 09/03/24 0845 8     Pain Loc --      Pain Education --      Exclude from Growth Chart --     Most  recent vital signs: Vitals:   09/03/24 0848 09/03/24 0900  BP: (!) 148/98   Pulse: 97   Resp: 20   Temp: 97.7 F (36.5 C)   SpO2: 97% 100%    General: Awake, no distress.  CV:  Good peripheral perfusion.  Regular rate and rhythm  Resp:  Normal effort.  Equal breath sounds bilaterally.  Abd:  No distention.  Soft, nontender largely benign abdomen.  ED Results / Procedures / Treatments   RADIOLOGY  Outpatient CT shows an 8 mm right mid ureteral stone.   MEDICATIONS ORDERED IN ED: Medications  ondansetron  (ZOFRAN ) injection 4 mg (has no administration in time range)  HYDROmorphone  (DILAUDID ) injection 0.5 mg (has no administration in time range)  sodium chloride  0.9 % bolus 1,000 mL (has no administration in time range)     IMPRESSION / MDM / ASSESSMENT AND PLAN / ED COURSE  I reviewed the triage vital signs and the nursing notes.  Patient's presentation is most consistent with acute presentation with potential threat to life or bodily function.  Patient presents emergency department for right flank pain and an  outpatient CT showing an 8 mm right-sided stone with concern for superimposed infection.  I am unable to see the patient's recent blood work or urine sample in our system or Care Everywhere.  They have no access to it through their phone, etc. we will recheck labs including a urine sample.  Will treat pain nausea and IV hydrate.  We will discuss treatment options once labs and urinalysis are known.  Patient's lab work shows a reassuring CBC with a normal white blood cell count.  Chemistry appears reassuring with a creatinine of 1.1.  Patient's urinalysis does show 11-20 white cells with few bacteria.  Somewhat complicating the workup is the patient was started on antibiotics and we do not have access to the prior lab work.  Patient continues to be in severe pain has received 2 rounds of Dilaudid  as well as 100 of fentanyl .  Patient has a Toradol allergy and takes oxycodone   chronically at home.  We will order an additional dose of Dilaudid  as well as a continuous pulse oximeter.  Patient is currently standing next to the bed bent over due to pain.  I spoke to Dr. Twylla we will admit to the hospital service for pain control with urology consultation for possible intervention.  Patient received IV Rocephin in the emergency department.  A urine culture has been sent.  FINAL CLINICAL IMPRESSION(S) / ED DIAGNOSES   Right flank pain Right sided kidney stone   Note:  This document was prepared using Dragon voice recognition software and Wickes include unintentional dictation errors.   Dorothyann Drivers, MD 09/03/24 1037

## 2024-09-03 NOTE — Transfer of Care (Signed)
 Immediate Anesthesia Transfer of Care Note  Patient: [name]  Procedure(s) Performed: CYSTOSCOPY, WITH STENT INSERTION (Right: Ureter)  Patient Location: PACU  Anesthesia Type:General  Level of Consciousness: awake, drowsy, and patient cooperative  Airway & Oxygen Therapy: Patient Spontanous Breathing  Post-op Assessment: Report given to RN and Post -op Vital signs reviewed and stable  Post vital signs: Reviewed and stable  Last Vitals:  Vitals Value Taken Time  BP 129/80 09/03/24 16:46  Temp    Pulse 90 09/03/24 16:48  Resp 22 09/03/24 16:48  SpO2 92 % 09/03/24 16:48  Vitals shown include unfiled device data.  Last Pain:  Vitals:   09/03/24 1535  TempSrc:   PainSc: 7          Complications: No notable events documented.

## 2024-09-03 NOTE — Progress Notes (Signed)
 Consent signed.

## 2024-09-03 NOTE — Anesthesia Postprocedure Evaluation (Signed)
 Anesthesia Post Note  Patient: [name]  Procedure(s) Performed: CYSTOSCOPY, WITH STENT INSERTION (Right: Ureter)  Patient location during evaluation: PACU Anesthesia Type: General Level of consciousness: awake and alert Pain management: pain level controlled Vital Signs Assessment: post-procedure vital signs reviewed and stable Respiratory status: spontaneous breathing, nonlabored ventilation, respiratory function stable and patient connected to nasal cannula oxygen Cardiovascular status: blood pressure returned to baseline and stable Postop Assessment: no apparent nausea or vomiting Anesthetic complications: no   No notable events documented.   Last Vitals:  Vitals:   09/03/24 1535 09/03/24 1646  BP: 134/86 129/80  Pulse: 64 87  Resp: 17 12  Temp:  36.6 C  SpO2: 94% 94%    Last Pain:  Vitals:   09/03/24 1646  TempSrc:   PainSc: 0-No pain                 Debby Mines

## 2024-09-03 NOTE — Anesthesia Procedure Notes (Signed)
 Procedure Name: Intubation Date/Time: 09/03/2024 4:15 PM  Performed by: Ledora Duncan, CRNAPre-anesthesia Checklist: Patient identified, Emergency Drugs available, Suction available and Patient being monitored Patient Re-evaluated:Patient Re-evaluated prior to induction Oxygen Delivery Method: Circle system utilized Preoxygenation: Pre-oxygenation with 100% oxygen Induction Type: IV induction Ventilation: Mask ventilation without difficulty Laryngoscope Size: McGrath and 3 Grade View: Grade I Tube type: Oral Tube size: 7.0 mm Number of attempts: 1 Airway Equipment and Method: Stylet Placement Confirmation: ETT inserted through vocal cords under direct vision, positive ETCO2 and breath sounds checked- equal and bilateral Secured at: 21 cm Tube secured with: Tape Dental Injury: Teeth and Oropharynx as per pre-operative assessment

## 2024-09-04 ENCOUNTER — Other Ambulatory Visit: Payer: Self-pay

## 2024-09-04 ENCOUNTER — Encounter: Payer: Self-pay | Admitting: Urology

## 2024-09-04 ENCOUNTER — Other Ambulatory Visit: Payer: Self-pay | Admitting: Physician Assistant

## 2024-09-04 DIAGNOSIS — N136 Pyonephrosis: Secondary | ICD-10-CM | POA: Diagnosis not present

## 2024-09-04 DIAGNOSIS — N132 Hydronephrosis with renal and ureteral calculous obstruction: Secondary | ICD-10-CM | POA: Diagnosis not present

## 2024-09-04 DIAGNOSIS — N201 Calculus of ureter: Secondary | ICD-10-CM

## 2024-09-04 LAB — BASIC METABOLIC PANEL WITH GFR
Anion gap: 10 (ref 5–15)
BUN: 13 mg/dL (ref 8–23)
CO2: 24 mmol/L (ref 22–32)
Calcium: 8.6 mg/dL — ABNORMAL LOW (ref 8.9–10.3)
Chloride: 105 mmol/L (ref 98–111)
Creatinine, Ser: 0.99 mg/dL (ref 0.61–1.24)
GFR, Estimated: >60 mL/min (ref >60)
Glucose, Bld: 137 mg/dL — ABNORMAL HIGH (ref 70–99)
Potassium: 4.1 mmol/L (ref 3.5–5.1)
Sodium: 139 mmol/L (ref 135–145)

## 2024-09-04 LAB — IRON AND TIBC
Iron: 43 ug/dL — ABNORMAL LOW (ref 45–182)
Saturation Ratios: 20 % (ref 17.9–39.5)
TIBC: 220 ug/dL — ABNORMAL LOW (ref 250–450)
UIBC: 177 ug/dL

## 2024-09-04 LAB — CBC
HCT: 37.1 % — ABNORMAL LOW (ref 39.0–52.0)
Hemoglobin: 12.5 g/dL — ABNORMAL LOW (ref 13.0–17.0)
MCH: 33.1 pg (ref 26.0–34.0)
MCHC: 33.7 g/dL (ref 30.0–36.0)
MCV: 98.1 fL (ref 80.0–100.0)
Platelets: 271 K/uL (ref 150–400)
RBC: 3.78 MIL/uL — ABNORMAL LOW (ref 4.22–5.81)
RDW: 12.7 % (ref 11.5–15.5)
WBC: 9.1 K/uL (ref 4.0–10.5)
nRBC: 0 % (ref 0.0–0.2)

## 2024-09-04 LAB — URINE CULTURE: Culture: NO GROWTH

## 2024-09-04 MED ORDER — FOLIC ACID 1 MG PO TABS
1.0000 mg | ORAL_TABLET | Freq: Every day | ORAL | Status: DC
  Administered 2024-09-04: 1 mg via ORAL
  Filled 2024-09-04: qty 1

## 2024-09-04 MED ORDER — OXYBUTYNIN CHLORIDE 5 MG PO TABS
5.0000 mg | ORAL_TABLET | Freq: Three times a day (TID) | ORAL | 0 refills | Status: DC | PRN
  Filled 2024-09-04: qty 20, 7d supply, fill #0

## 2024-09-04 MED ORDER — FOLIC ACID 1 MG PO TABS
1.0000 mg | ORAL_TABLET | Freq: Every day | ORAL | 0 refills | Status: DC
  Filled 2024-09-04: qty 90, 90d supply, fill #0

## 2024-09-04 NOTE — Progress Notes (Signed)
 Urology Inpatient Progress Note  Subjective: No acute events overnight. He is afebrile, VSS. WBC count down, 9.1. Creatinine down, 0.99.  Admission urine culture finalized with no growth, intraop urine culture pending; on antibiotics as below. Today he reports feeling much better since stent placement.  He has occasional twinges of right flank discomfort, but this is manageable.  Anti-infectives: Anti-infectives (From admission, onward)    Start     Dose/Rate Route Frequency Ordered Stop   09/04/24 1000  cefTRIAXone (ROCEPHIN) 1 g in sodium chloride  0.9 % 100 mL IVPB        1 g 200 mL/hr over 30 Minutes Intravenous Every 24 hours 09/03/24 1119     09/03/24 1015  cefTRIAXone (ROCEPHIN) 1 g in sodium chloride  0.9 % 100 mL IVPB        1 g 200 mL/hr over 30 Minutes Intravenous  Once 09/03/24 1007 09/03/24 1118       Current Facility-Administered Medications  Medication Dose Route Frequency Provider Last Rate Last Admin   aspirin  EC tablet 81 mg  81 mg Oral Daily Stoioff, Scott C, MD   81 mg at 09/04/24 0902   cefTRIAXone (ROCEPHIN) 1 g in sodium chloride  0.9 % 100 mL IVPB  1 g Intravenous Q24H Stoioff, Scott C, MD 200 mL/hr at 09/04/24 0902 1 g at 09/04/24 0902   clotrimazole (MYCELEX) troche 10 mg  10 mg Oral 5 X Daily Stoioff, Scott C, MD   10 mg at 09/04/24 0902   finasteride (PROSCAR) tablet 5 mg  5 mg Oral Daily Stoioff, Scott C, MD   5 mg at 09/04/24 9097   folic acid (FOLVITE) tablet 1 mg  1 mg Oral Daily Von Bellis, MD   1 mg at 09/04/24 9097   gabapentin  (NEURONTIN ) capsule 300 mg  300 mg Oral BID Stoioff, Scott C, MD       HYDROmorphone  (DILAUDID ) injection 0.5 mg  0.5 mg Intravenous Q4H PRN Stoioff, Scott C, MD   0.5 mg at 09/03/24 1338   ondansetron  (ZOFRAN ) injection 4 mg  4 mg Intravenous Once Stoioff, Scott C, MD       oxybutynin (DITROPAN) tablet 5 mg  5 mg Oral Q8H PRN Stoioff, Scott C, MD   5 mg at 09/04/24 0910   oxyCODONE  (Oxy IR/ROXICODONE ) immediate release tablet  5 mg  5 mg Oral Q6H PRN Stoioff, Scott C, MD   5 mg at 09/04/24 0534   pantoprazole  (PROTONIX ) EC tablet 40 mg  40 mg Oral Daily Stoioff, Scott C, MD   40 mg at 09/04/24 0902   promethazine  (PHENERGAN ) 12.5 mg in sodium chloride  0.9 % 50 mL IVPB  12.5 mg Intravenous Q6H PRN Stoioff, Scott C, MD       senna-docusate (Senokot-S) tablet 1 tablet  1 tablet Oral QHS PRN Stoioff, Scott C, MD       tamsulosin (FLOMAX) capsule 0.4 mg  0.4 mg Oral Daily Stoioff, Scott C, MD   0.4 mg at 09/04/24 0902    Objective: Vital signs in last 24 hours: Temp:  [97.5 F (36.4 C)-98 F (36.7 C)] 97.8 F (36.6 C) (11/05 0803) Pulse Rate:  [51-108] 51 (11/05 0803) Resp:  [12-23] 18 (11/05 0803) BP: (98-147)/(64-89) 107/80 (11/05 0803) SpO2:  [93 %-97 %] 97 % (11/05 0803)  Intake/Output from previous day: 11/04 0701 - 11/05 0700 In: 700 [I.V.:700] Out: 150 [Urine:150] Intake/Output this shift: Total I/O In: 200 [P.O.:200] Out: -   Physical Exam Vitals and nursing note reviewed.  Constitutional:      General: He is not in acute distress.    Appearance: He is not ill-appearing, toxic-appearing or diaphoretic.  HENT:     Head: Normocephalic and atraumatic.  Pulmonary:     Effort: Pulmonary effort is normal. No respiratory distress.  Skin:    General: Skin is warm and dry.  Neurological:     Mental Status: He is alert and oriented to person, place, and time.  Psychiatric:        Mood and Affect: Mood normal.        Behavior: Behavior normal.    Lab Results:  Recent Labs    09/03/24 0849 09/04/24 0331  WBC 10.4 9.1  HGB 15.3 12.5*  HCT 44.2 37.1*  PLT 316 271   BMET Recent Labs    09/03/24 0849 09/04/24 0331  NA 139 139  K 3.7 4.1  CL 103 105  CO2 25 24  GLUCOSE 116* 137*  BUN 15 13  CREATININE 1.11 0.99  CALCIUM  9.1 8.6*   Studies/Results: DG OR UROLOGY CYSTO IMAGE ([facility] ONLY) Result Date: 09/03/2024 There is no interpretation for this exam.  This order is for images  obtained during a surgical procedure.  Please See Surgeries Tab for more information regarding the procedure.   CT ABDOMEN PELVIS W CONTRAST Result Date: 09/02/2024 EXAM: CT ABDOMEN AND PELVIS WITH CONTRAST 09/02/2024 02:56:53 PM TECHNIQUE: CT of the abdomen and pelvis was performed with the administration of 100 mL of iohexol  (OMNIPAQUE ) 300 MG/ML solution. Multiplanar reformatted images are provided for review. Automated exposure control, iterative reconstruction, and/or weight-based adjustment of the mA/kV was utilized to reduce the radiation dose to as low as reasonably achievable. COMPARISON: CT abdomen and pelvis 04/10/2024 CLINICAL HISTORY: FINDINGS: LOWER CHEST: Coronary artery calcification. LIVER: The liver is unremarkable. GALLBLADDER AND BILE DUCTS: Gallbladder is unremarkable. No biliary ductal dilatation. SPLEEN: No acute abnormality. PANCREAS: No acute abnormality. ADRENAL GLANDS: No acute abnormality. KIDNEYS, URETERS AND BLADDER: Right kidney: Nephrolithiasis measuring up to 5 mm. Right ureter: Mid right ureteral 8 mm calcified stone with associated proximal mild hydronephrosis and urothelial thickening. Distally, the ureter is normal in caliber with no ureterolithiasis. Left kidney: No left nephrolithiasis. Left ureter: No left ureterolithiasis. No left hydroureteronephrosis. Bladder: Prostate leads to mass effect on the posterior urinary bladder wall. Prostate: Prominent prostate measuring up to 4.6 cm. General: No perinephric or periureteral stranding. GI AND BOWEL: Stomach demonstrates no acute abnormality. Colonic diverticulosis. The appendix is unremarkable. There is no bowel obstruction. PERITONEUM AND RETROPERITONEUM: No ascites. No free air. Nonspecific convexity of the mesentery of the small bowel (2:49). VASCULATURE: Stable aneurysmal infrarenal abdominal aorta with diameter measuring up to 3.3 cm. Severe calcified and noncalcified atherosclerotic plaque. 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 LYMPH NODES: No lymphadenopathy. REPRODUCTIVE ORGANS: No acute abnormality. BONES AND SOFT TISSUES: Small fat-containing right spigelian hernia (2.68). No acute osseous abnormality. No focal soft tissue abnormality. IMPRESSION: 1. Obstructive 8 mm right ureteral calculus with associated urothelial filling defect suggestive of superimposed infection. 2. Nonobstructive right nephrolithiasis measuring up to 5 mm. 3. Prominent prostate. 4. Stable infrarenal abdominal aortic aneurysm measuring up to 3.3 cm; recommend ultrasound follow-up in 3 years. 5. Atherosclerotic plaque. 6. No acute findings. Electronically signed by: Morgane Naveau MD 09/02/2024 03:50 PM EST RP Workstation: [id number]   Assessment & Plan: 63 y.o. 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 Twylla for management of an obstructing 8 mm proximal right ureteral stone with UTI.  He is clinically improving on antibiotics; with admission culture negative, suspect empiric Cipro was culture appropriate since he started this prior to arrival.  He is tolerating his stent with some mild occasional discomfort.  We discussed that he will require 7 days of antibiotics to treat his infection, followed by outpatient right ureteroscopy with laser lithotripsy and stent exchange in ~2 weeks to treat his stone. He expressed understanding.  Recommendations: -Continue empiric antibiotics and follow cultures for a total of 7 days of culture-appropriate therapy. -Continue Flomax 0.4mg  daily and oxybutynin 5mg  every 8 hours as needed for stent discomfort. -Outpatient right URS/LL/stent exchange with Dr. Twylla in ~2 weeks; our scheduler will contact him after discharge to arrange  Lucie Hones, PA-C 09/04/2024

## 2024-09-04 NOTE — Discharge Summary (Signed)
 [facility] Discharge Summary   Patient: [name] FMW:[id number]  PCP: Cleotilde Oneil FALCON, MD  Date of admission: 09/03/2024   Date of discharge:  09/04/2024     Discharge Diagnoses:  Principal Problem:   Ureteral stone with hydronephrosis Active Problems:   NEPHROLITHIASIS   UTI (urinary tract infection)   Admitted From: Home Disposition:  Home   Recommendations for Outpatient Follow-up:  PCP: in 1 wk F/u with Urology in 2 weeks Follow up LABS/TEST:  f/u pending urine Cx report   Follow-up Information     Cleotilde Oneil FALCON, MD Follow up in 1 week(s).   Specialty: Internal Medicine Contact information: [facility] [address] [place] [address] [phone]         Twylla Glendia BROCKS, MD Follow up in 2 week(s).   Specialty: Urology Contact information: [address] [place] [address] [phone]                Diet recommendation: Cardiac diet  Activity: The patient is advised to gradually reintroduce usual activities, as tolerated  Discharge Condition: stable  Code Status: Full code   History of present illness: As per the H and P dictated on admission.  Hospital Course:   [name] is a 63 y.o. male with medical history significant of BPH, recurrent kidney stones, GERD, COPD, presented with worsening of right flank pain and dysuria.   10 days ago, patient underwent a right side groin hernia repair.  After that about 7 to 8 days ago, patient started to have a sharp like right-sided flank pain, initially he thought it was for surgical pain and he took oxycodone  with some relief.  4 to 5 days ago he started to have burning sensation urinary frequency when urinate, outpatient UA showed UTI and he was started on Cipro.  He outpatient CT abdomen pelvis yesterday showed obstructing 8 mm right-sided ureteral stone and he was instructed to come to ED today.  He denies any fever or chills, he feels nausea but no  vomiting, and denied any diarrhea.   ED Course: Afebrile, blood pressure 140/90 O2 saturation 100% on room air.  Blood work showed glucose 116, BUN 15 creatinine 1.1 WBC 10.4 hemoglobin 15.3.  UA showed 2+ WBC and 2+ RBC.   Patient was given 1 dose of ceftriaxone in the ED.   Assessment/Plan:   # Right-sided obstructive ureteral stone # Right-sided obstructive uropathy Urology consulted, s/p right ureteral stent inserted by urology on 11/4.  Patient tolerated procedure well.  Today patient was seen by urology and cleared for discharge.  Recommended to continue ciprofloxacin 500 mg p.o. twice daily which patient was taking at home.  Urine culture is still pending, urology will follow   # Complicated UTI Possible early pyelonephritis -Given there is a complicated condition of obstructive right-sided ureteral stone. S/p ceftriaxone. Urine culture is pending. S/p ceftriaxone, discharged on ciprofloxacin.     # BPH: Continue Flomax and Proscar  Body mass index is 23.06 kg/m.  Nutrition Interventions:  Patient was admitted as an inpatient. patient improved quicker than expected given intervention.  Patient is feeling better now, would like to go home and he is cleared by urology to discharge and follow-up as an outpatient.  Patient was ambulatory without any assistance. On the day of the discharge the patient's vitals were stable, and no other acute medical condition were reported by patient. the patient was felt safe to be discharge at Home.  Consultants: Urology Procedures: s/p Right Ureteral Stent  Discharge Exam: General: Appear in no distress, no Rash; Oral Mucosa Clear, moist. Cardiovascular: S1 and S2 Present, no Murmur, Respiratory: normal respiratory effort, Bilateral Air entry present and no Crackles, no wheezes Abdomen: Bowel Sound present, Soft and mild Rt flank tenderness. Extremities: no Pedal edema, no calf tenderness Neurology: alert and oriented to time, place, and  person affect appropriate.  Filed Weights   09/03/24 0846  Weight: 77.1 kg   Vitals:   09/04/24 0529 09/04/24 0803  BP: 102/69 107/80  Pulse: 61 (!) 51  Resp: 18 18  Temp: 97.7 F (36.5 C) 97.8 F (36.6 C)  SpO2: 97% 97%    DISCHARGE MEDICATION: Allergies as of 09/04/2024       Reactions   Codeine Hives, Nausea And Vomiting   High Dose of Codeine causes Hives   Ketorolac Tromethamine Nausea And Vomiting        Medication List     STOP taking these medications    gabapentin  300 MG capsule Commonly known as: NEURONTIN    tiZANidine  4 MG tablet Commonly known as: Zanaflex        TAKE these medications    aspirin  EC 81 MG tablet Take 81 mg by mouth daily. Swallow whole. What changed: Another medication with the same name was removed. Continue taking this medication, and follow the directions you see here.   B-D 3CC LUER-LOK SYR 25GX1 25G X 1 3 ML Misc Generic drug: SYRINGE-NEEDLE (DISP) 3 ML as directed.   ciprofloxacin 500 MG tablet Commonly known as: CIPRO Take 500 mg by mouth 2 (two) times daily.  Take 1 tablet (500 mg total) by mouth 2 (two) times daily for 10 days   clotrimazole 10 MG troche Commonly known as: MYCELEX Take 10 mg by mouth 5 (five) times daily.   cyanocobalamin  1000 MCG/ML injection Commonly known as: VITAMIN B12 Inject 1,000 mcg into the muscle every 30 (thirty) days.   finasteride 5 MG tablet Commonly known as: PROSCAR Take 5 mg by mouth daily.   folic acid 1 MG tablet Commonly known as: FOLVITE Take 1 tablet (1 mg total) by mouth daily. Start taking on: September 05, 2024   metoCLOPramide  10 MG tablet Commonly known as: REGLAN  Take 1 tablet (10 mg total) by mouth every 6 (six) hours as needed for nausea.   multivitamin with minerals tablet Take 1 tablet by mouth daily.   oxybutynin 5 MG tablet Commonly known as: DITROPAN Take 1 tablet (5 mg total) by mouth every 8 (eight) hours as needed for bladder spasms (Frequency,  urgency).   oxyCODONE  5 MG immediate release tablet Commonly known as: Roxicodone  Take 1 tablet (5 mg total) by mouth every 6 (six) hours as needed for severe pain (pain score 7-10), breakthrough pain or moderate pain (pain score 4-6).   pantoprazole  40 MG tablet Commonly known as: PROTONIX  Take 40 mg by mouth daily.   promethazine  25 MG tablet Commonly known as: PHENERGAN  Take 25 mg by mouth every 6 (six) hours as needed for nausea.   tamsulosin 0.4 MG Caps capsule Commonly known as: FLOMAX Take 0.4 mg by mouth daily.       Allergies  Allergen Reactions   Codeine Hives and Nausea And Vomiting    High Dose of Codeine causes Hives   Ketorolac Tromethamine Nausea And Vomiting   Discharge Instructions     Call MD for:  extreme fatigue   Complete by: As directed    Call MD  for:  persistant dizziness or light-headedness   Complete by: As directed    Call MD for:  persistant nausea and vomiting   Complete by: As directed    Call MD for:  severe uncontrolled pain   Complete by: As directed    Call MD for:  temperature >100.4   Complete by: As directed    Diet - low sodium heart healthy   Complete by: As directed    Discharge instructions   Complete by: As directed    Follow-up with PCP in 1 week Follow-up with urology in 1 to 2-week   Increase activity slowly   Complete by: As directed    No wound care   Complete by: As directed        The results of significant diagnostics from this hospitalization (including imaging, microbiology, ancillary and laboratory) are listed below for reference.    Significant Diagnostic Studies: DG OR UROLOGY CYSTO IMAGE ([facility] ONLY) Result Date: 09/03/2024 There is no interpretation for this exam.  This order is for images obtained during a surgical procedure.  Please See Surgeries Tab for more information regarding the procedure.   CT ABDOMEN PELVIS W CONTRAST Result Date: 09/02/2024 EXAM: CT ABDOMEN AND PELVIS WITH CONTRAST  09/02/2024 02:56:53 PM TECHNIQUE: CT of the abdomen and pelvis was performed with the administration of 100 mL of iohexol  (OMNIPAQUE ) 300 MG/ML solution. Multiplanar reformatted images are provided for review. Automated exposure control, iterative reconstruction, and/or weight-based adjustment of the mA/kV was utilized to reduce the radiation dose to as low as reasonably achievable. COMPARISON: CT abdomen and pelvis 04/10/2024 CLINICAL HISTORY: FINDINGS: LOWER CHEST: Coronary artery calcification. LIVER: The liver is unremarkable. GALLBLADDER AND BILE DUCTS: Gallbladder is unremarkable. No biliary ductal dilatation. SPLEEN: No acute abnormality. PANCREAS: No acute abnormality. ADRENAL GLANDS: No acute abnormality. KIDNEYS, URETERS AND BLADDER: Right kidney: Nephrolithiasis measuring up to 5 mm. Right ureter: Mid right ureteral 8 mm calcified stone with associated proximal mild hydronephrosis and urothelial thickening. Distally, the ureter is normal in caliber with no ureterolithiasis. Left kidney: No left nephrolithiasis. Left ureter: No left ureterolithiasis. No left hydroureteronephrosis. Bladder: Prostate leads to mass effect on the posterior urinary bladder wall. Prostate: Prominent prostate measuring up to 4.6 cm. General: No perinephric or periureteral stranding. GI AND BOWEL: Stomach demonstrates no acute abnormality. Colonic diverticulosis. The appendix is unremarkable. There is no bowel obstruction. PERITONEUM AND RETROPERITONEUM: No ascites. No free air. Nonspecific convexity of the mesentery of the small bowel (2:49). VASCULATURE: Stable aneurysmal infrarenal abdominal aorta with diameter measuring up to 3.3 cm. Severe calcified and noncalcified atherosclerotic plaque. 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 LYMPH NODES: No lymphadenopathy. REPRODUCTIVE ORGANS: No acute abnormality. BONES AND SOFT TISSUES: Small fat-containing right spigelian hernia (2.68). No acute osseous  abnormality. No focal soft tissue abnormality. IMPRESSION: 1. Obstructive 8 mm right ureteral calculus with associated urothelial filling defect suggestive of superimposed infection. 2. Nonobstructive right nephrolithiasis measuring up to 5 mm. 3. Prominent prostate. 4. Stable infrarenal abdominal aortic aneurysm measuring up to 3.3 cm; recommend ultrasound follow-up in 3 years. 5. Atherosclerotic plaque. 6. No acute findings. Electronically signed by: Morgane Naveau MD 09/02/2024 03:50 PM EST RP Workstation: [id number]    Microbiology: Recent Results (from the past 240 hours)  Urine Culture     Status: None   Collection Time: 09/03/24  8:49 AM   Specimen: Urine, Clean Catch  Result Value Ref Range Status   Specimen Description   Final  URINE, CLEAN CATCH Performed at [facility], [address], [place], [place] [address]    Special Requests   Final    NONE Performed at [facility], [address] [place], [place] [address]    Culture   Final    NO GROWTH Performed at [facility], [address], [place], [place] [address]    Report Status 09/04/2024 FINAL  Final     Labs: CBC: Recent Labs  Lab 09/03/24 0849 09/04/24 0331  WBC 10.4 9.1  HGB 15.3 12.5*  HCT 44.2 37.1*  MCV 97.4 98.1  PLT 316 271   Basic Metabolic Panel: Recent Labs  Lab 09/03/24 0849 09/04/24 0331  NA 139 139  K 3.7 4.1  CL 103 105  CO2 25 24  GLUCOSE 116* 137*  BUN 15 13  CREATININE 1.11 0.99  CALCIUM  9.1 8.6*   Liver Function Tests: No results for input(s): AST, ALT, ALKPHOS, BILITOT, PROT, ALBUMIN in the last 168 hours. No results for input(s): LIPASE, AMYLASE in the last 168 hours. No results for input(s): AMMONIA in the last 168 hours. Cardiac Enzymes: No results for input(s): CKTOTAL, CKMB, CKMBINDEX, TROPONINI in the last 168 hours. BNP (last 3 results) Recent Labs    12/25/23 1727  BNP 11.5   CBG: No results for input(s):  GLUCAP in the last 168 hours.  Time spent: 35 minutes   Signed:  Elvan Sor  [facility] 09/04/2024 1:32 PM

## 2024-09-04 NOTE — TOC CM/SW Note (Signed)
 Transition of Care [facility]) - Inpatient Brief Assessment   Patient Details  Name: [name] MRN: [id number] Date of Birth: April 23, 1961  Transition of Care [facility]) CM/SW Contact:    Corean ONEIDA Haddock, RN Phone Number: 09/04/2024, 10:24 AM   Clinical Narrative:  Transition of Care [facility]) Screening Note   Patient Details  Name: [name] Date of Birth: 18-Masaki-1962   Transition of Care [facility]) CM/SW Contact:    Corean ONEIDA Haddock, RN Phone Number: 09/04/2024, 10:24 AM    Transition of Care Department (TOC) has reviewed patient and no TOC needs have been identified at this time.If new patient transition needs arise, please place a TOC consult.     Transition of Care Asessment: Insurance and Status: Insurance coverage has been reviewed Patient has primary care physician: Yes     Prior/Current Home Services: No current home services Social Drivers of Health Review: SDOH reviewed no interventions necessary Readmission risk has been reviewed: Yes Transition of care needs: no transition of care needs at this time

## 2024-09-04 NOTE — Interval H&P Note (Signed)
 History and Physical Interval Note:  09/04/2024 12:27 PM  [name]  has presented today for surgery, with the diagnosis of Right renal calculus.  The various methods of treatment have been discussed with the patient and family. After consideration of risks, benefits and other options for treatment, the patient has consented to  Procedure(s): CYSTOSCOPY, WITH STENT INSERTION (Right) as a surgical intervention.  The patient's history has been reviewed, patient examined, no change in status, stable for surgery.  I have reviewed the patient's chart and labs.  Questions were answered to the patient's satisfaction.    CV:RRR Lungs:clear  [name]

## 2024-09-04 NOTE — Progress Notes (Signed)
 Mobility Specialist Progress Note:    09/04/24 1100  Mobility  Activity Ambulated independently  Level of Assistance Independent  Assistive Device None  Distance Ambulated (ft) 320 ft  Range of Motion/Exercises Active;All extremities  Activity Response Tolerated well  Mobility visit 1 Mobility  Mobility Specialist Start Time (ACUTE ONLY) 1045  Mobility Specialist Stop Time (ACUTE ONLY) 1057  Mobility Specialist Time Calculation (min) (ACUTE ONLY) 12 min   Pt received in bed, visitors at bedside. Agreeable to mobility, independently able to stand and ambulate with no AD. Tolerated well, rated pain 2/10. Pt very eager to go home. Returned to room, all needs met.  Sherrilee Ditty Mobility Specialist Please contact via Special Educational Needs Teacher or  Rehab office at [phone]

## 2024-09-05 ENCOUNTER — Telehealth: Payer: Self-pay

## 2024-09-05 ENCOUNTER — Other Ambulatory Visit: Payer: Self-pay

## 2024-09-05 DIAGNOSIS — N201 Calculus of ureter: Secondary | ICD-10-CM

## 2024-09-05 LAB — URINE CULTURE: Culture: NO GROWTH

## 2024-09-05 NOTE — Progress Notes (Signed)
 Surgical Physician Order Form [facility] [place]  Dr. Twylla * Scheduling expectation : ~2 weeks  *Length of Case:   *Clearance needed: no  *Anticoagulation Instructions: N/A  *Aspirin  Instructions: Ok to continue Aspirin   *Post-op visit Date/Instructions:  TBD  *Diagnosis: Right Ureteral Stone  *Procedure: right  Ureteroscopy w/laser lithotripsy & stent exchange (47643)   Additional orders: N/A  -Admit type: OUTpatient  -Anesthesia: General  -VTE Prophylaxis Standing Order SCD's       Other:   -Standing Lab Orders Per Anesthesia    Lab other: None  -Standing Test orders EKG/Chest x-ray per Anesthesia       Test other:   - Medications:  Ancef  2gm IV  -Other orders:  N/A

## 2024-09-05 NOTE — Progress Notes (Signed)
   [facility] Surgical Posting Form  Surgery Date: Date: 09/19/2024  Surgeon: Dr. Glendia Barba, MD  Inpt ( No  )   Outpt (Yes)   Obs ( No  )   Diagnosis: N20.1 Right Ureteral Stone  -CPT: [phone]  Surgery: Right Ureteroscopy with Laser Lithotripsy and Stent Exchange  Stop Anticoagulations: No  Cardiac/Medical/Pulmonary Clearance needed: no  *Orders entered into EPIC  Date: 09/05/24   *Case booked in [place]  Date: 09/05/24  *Notified pt of Surgery: Date: 09/05/24  PRE-OP UA & CX: no  *Placed into Prior Authorization Work [place] Date: 09/05/24  Assistant/laser/rep:No

## 2024-09-05 NOTE — Telephone Encounter (Signed)
 Per Dr. Twylla, Patient is to be scheduled for Right Ureteroscopy with Laser Lithotripsy and Stent Exchange   Mr. Hemminger was contacted and possible surgical dates were discussed, Thursday November 20th, 2025 was agreed upon for surgery.   Patient was directed to call [phone] between 1-3pm the day before surgery to find out surgical arrival time.  Instructions were given not to eat or drink from midnight on the night before surgery and have a driver for the day of surgery. On the surgery day patient was instructed to enter through the Medical Mall entrance of [facility] report the Same Day Surgery desk.   Pre-Admit Testing will be in contact via phone to set up an interview with the anesthesia team to review your history and medications prior to surgery.   Reminder of this information was sent via MyChart to the patient.

## 2024-09-09 ENCOUNTER — Emergency Department
Admission: EM | Admit: 2024-09-09 | Discharge: 2024-09-09 | Disposition: A | Discharge status: Home / Self Care | Attending: Emergency Medicine | Admitting: Emergency Medicine

## 2024-09-09 ENCOUNTER — Emergency Department

## 2024-09-09 DIAGNOSIS — R339 Retention of urine, unspecified: Secondary | ICD-10-CM

## 2024-09-09 DIAGNOSIS — R109 Unspecified abdominal pain: Secondary | ICD-10-CM | POA: Diagnosis present

## 2024-09-09 DIAGNOSIS — N202 Calculus of kidney with calculus of ureter: Secondary | ICD-10-CM | POA: Insufficient documentation

## 2024-09-09 DIAGNOSIS — N200 Calculus of kidney: Secondary | ICD-10-CM

## 2024-09-09 LAB — LIPASE, BLOOD: Lipase: 24 U/L (ref 11–51)

## 2024-09-09 LAB — CBC
HCT: 46.8 % (ref 39.0–52.0)
Hemoglobin: 16 g/dL (ref 13.0–17.0)
MCH: 33.2 pg (ref 26.0–34.0)
MCHC: 34.2 g/dL (ref 30.0–36.0)
MCV: 97.1 fL (ref 80.0–100.0)
Platelets: 373 K/uL (ref 150–400)
RBC: 4.82 MIL/uL (ref 4.22–5.81)
RDW: 13.1 % (ref 11.5–15.5)
WBC: 11.1 K/uL — ABNORMAL HIGH (ref 4.0–10.5)
nRBC: 0 % (ref 0.0–0.2)

## 2024-09-09 LAB — URINALYSIS, ROUTINE W REFLEX MICROSCOPIC
Bilirubin Urine: NEGATIVE
Glucose, UA: NEGATIVE mg/dL
Ketones, ur: NEGATIVE mg/dL
Nitrite: NEGATIVE
Protein, ur: 100 mg/dL — AB
RBC / HPF: >50 RBC/hpf (ref 0–5)
Specific Gravity, Urine: 1.009 (ref 1.005–1.030)
WBC, UA: >50 WBC/hpf (ref 0–5)
pH: 7 (ref 5.0–8.0)

## 2024-09-09 LAB — COMPREHENSIVE METABOLIC PANEL WITH GFR
ALT: 16 U/L (ref 0–44)
AST: 19 U/L (ref 15–41)
Albumin: 3.8 g/dL (ref 3.5–5.0)
Alkaline Phosphatase: 72 U/L (ref 38–126)
Anion gap: 15 (ref 5–15)
BUN: 15 mg/dL (ref 8–23)
CO2: 20 mmol/L — ABNORMAL LOW (ref 22–32)
Calcium: 9 mg/dL (ref 8.9–10.3)
Chloride: 104 mmol/L (ref 98–111)
Creatinine, Ser: 1.21 mg/dL (ref 0.61–1.24)
GFR, Estimated: >60 mL/min (ref >60)
Glucose, Bld: 126 mg/dL — ABNORMAL HIGH (ref 70–99)
Potassium: 4 mmol/L (ref 3.5–5.1)
Sodium: 139 mmol/L (ref 135–145)
Total Bilirubin: 0.8 mg/dL (ref 0.0–1.2)
Total Protein: 7.8 g/dL (ref 6.5–8.1)

## 2024-09-09 MED ORDER — ONDANSETRON HCL 4 MG/2ML IJ SOLN
4.0000 mg | Freq: Once | INTRAMUSCULAR | Status: AC
  Administered 2024-09-09: 4 mg via INTRAVENOUS
  Filled 2024-09-09: qty 2

## 2024-09-09 MED ORDER — HYDROMORPHONE HCL 1 MG/ML IJ SOLN
1.0000 mg | Freq: Once | INTRAMUSCULAR | Status: AC
  Administered 2024-09-09: 1 mg via INTRAVENOUS
  Filled 2024-09-09: qty 1

## 2024-09-09 MED ORDER — OXYCODONE HCL 5 MG PO TABS
5.0000 mg | ORAL_TABLET | Freq: Once | ORAL | Status: AC
  Administered 2024-09-09: 5 mg via ORAL
  Filled 2024-09-09: qty 1

## 2024-09-09 MED ORDER — SODIUM CHLORIDE 0.9 % IV BOLUS
1000.0000 mL | Freq: Once | INTRAVENOUS | Status: AC
  Administered 2024-09-09: 1000 mL via INTRAVENOUS

## 2024-09-09 NOTE — Discharge Instructions (Signed)
 Leave the Foley catheter in place and return to the ER if you develop fevers, worsening symptoms or any other concerns.  Your urine culture is pending.  Dr. Twylla should follow-up with your urine culture to see if you need additional antibiotics.

## 2024-09-09 NOTE — ED Provider Notes (Signed)
 [facility] Provider Note    Event Date/Time   First MD Initiated Contact with Patient 09/09/24 1123     (approximate)   History   Abdominal Pain   HPI  [name] is a 63 y.o. male with history of known kidney stones who comes in with continued abdominal pain.  I reviewed the discharge note from 09/04/2024 where patient underwent CT imaging showing an obstructing 8 mm right sided ureteral stone and he was already on Cipro for concerns for UTI.  He had a stent placed on 11/4 and I recommend to continue taking the Cipro.  Patient comes in today due to worsening pain with the last oxycodone  at 4:30 AM.  Patient reports that he is unable to urinate.  He reports worsening pain on his right side with fullness in his bladder due to inability to urinate.  He states that this started around midnight when he last had a normal urination.  Denies any issues with constipation.  He does report taking oxybutynin, oxycodone .   Physical Exam   Triage Vital Signs: ED Triage Vitals  Encounter Vitals Group     BP 09/09/24 1120 (!) 160/102     Girls Systolic BP Percentile --      Girls Diastolic BP Percentile --      Boys Systolic BP Percentile --      Boys Diastolic BP Percentile --      Pulse Rate 09/09/24 1120 (!) 127     Resp 09/09/24 1120 20     Temp 09/09/24 1120 (!) 97.4 F (36.3 C)     Temp Source 09/09/24 1120 Oral     SpO2 09/09/24 1120 98 %     Weight 09/09/24 1117 170 lb (77.1 kg)     Height 09/09/24 1117 6' (1.829 m)     Head Circumference --      Peak Flow --      Pain Score 09/09/24 1117 10     Pain Loc --      Pain Education --      Exclude from Growth Chart --     Most recent vital signs: Vitals:   09/09/24 1120  BP: (!) 160/102  Pulse: (!) 127  Resp: 20  Temp: (!) 97.4 F (36.3 C)  SpO2: 98%     General: Awake, no distress.  CV:  Good peripheral perfusion.  Tachycardic Resp:  Normal effort.  Abd:  No distention.  Tenderness in  the lower abdomen.  Right flank tenderness. Other:     ED Results / Procedures / Treatments   Labs (all labs ordered are listed, but only abnormal results are displayed) Labs Reviewed  LIPASE, BLOOD  COMPREHENSIVE METABOLIC PANEL WITH GFR  CBC  URINALYSIS, ROUTINE W REFLEX MICROSCOPIC     EKG  My interpretation of EKG:  Normal sinus rate 69 without any ST elevation or T wave inversions, normal intervals  RADIOLOGY I have reviewed the xray personally and interpreted Stent appears to be in place   PROCEDURES:  Critical Care performed: No  Procedures   MEDICATIONS ORDERED IN ED: Medications - No data to display   IMPRESSION / MDM / ASSESSMENT AND PLAN / ED COURSE  I reviewed the triage vital signs and the nursing notes.   Patient's presentation is most consistent with acute presentation with potential threat to life or bodily function.  Differential includes sepsis, UTI, urinary retention, AKI, kidney stone.   Bladder scan was retaining with greater than  400 cc therefore Foley catheter was placed  12:07 PM Discussed with Dr Rudolfo to have the Foley placed even though there is a stent there.  Kidney stone still appears to be in the same spot.  Does not feel like he needs any additional imaging for the kidney stone.  Discussed with Dr. Twylla again given his urine does look concerning for potential UTI but leuk and nitrite negative and no fevers.  He was okay with holding off on additional antibiotics as he still has 2 more days worth and he will follow-up on the urine culture to see if additional antibiotics will need to be ordered.  Patient feels improved after Foley catheter placement.  He still has some right flank pain but that secondary to his known kidney stone.  Repeat vital signs  2:54 PM His heart rate was being documented as elevated but patient reports feeling much improved he is requesting to be discharged home.  Will get EKG to see what his  heart rate truly is.  We discussed the leaving the Foley catheter in due to concerns for retention possibly secondary to oxybutynin versus oxycodone .  He denies any fevers he reports the pain he was having after the Foley was placed with the same kidney stone pain he was having previously so I do not feel like CT imaging is required as patient reports symptoms are very similar to his prior kidney stone pain other than the urinary retention which is now resolved with Foley placement.  His EKG is normal sinus with heart rates in the 60s I am not sure why they are with these elevated heart rates charted except when he initially came in I do suspect he was tachycardic secondary to pain none the patient is resting comfortably in bed his heart rates are normal I do not see any evidence of A-fib.  Therefore patient will be discharged home  The patient is on the cardiac monitor to evaluate for evidence of arrhythmia and/or significant heart rate changes.      FINAL CLINICAL IMPRESSION(S) / ED DIAGNOSES   Final diagnoses:  Urinary retention  Kidney stone     Rx / DC Orders   ED Discharge Orders     None        Note:  This document was prepared using Dragon voice recognition software and Agredano include unintentional dictation errors.   Ernest Ronal BRAVO, MD 09/09/24 1500

## 2024-09-09 NOTE — ED Triage Notes (Addendum)
 Pt to ED via POV from home. Pt reports was just discharged for a 8mm kidney stone with stent placed and today cannot urinate. Pt reports right bladder and flank pain. Pt is scheduled for surgery 11/20th. Pt appears very uncomfortable and unable to sit down in triage due to pain. Last oxycodone  at 0430am  Pt did not want pain medication in triage

## 2024-09-09 NOTE — ED Notes (Signed)
 Pt's foley bag was changed into a leg bag for discharge. Pt advised about follow up w/ urologist.

## 2024-09-10 LAB — URINE CULTURE: Culture: NO GROWTH

## 2024-09-12 ENCOUNTER — Encounter
Admission: RE | Admit: 2024-09-12 | Discharge: 2024-09-12 | Disposition: A | Discharge status: Home / Self Care | Source: Ambulatory Visit | Attending: Urology | Admitting: Urology

## 2024-09-12 HISTORY — DX: Personal history of other diseases of the digestive system: Z87.19

## 2024-09-12 HISTORY — DX: Nicotine dependence, cigarettes, uncomplicated: F17.210

## 2024-09-12 HISTORY — DX: Nontoxic single thyroid nodule: E04.1

## 2024-09-12 HISTORY — DX: Atherosclerosis of aorta: I70.0

## 2024-09-12 HISTORY — DX: Other intervertebral disc degeneration, lumbar region without mention of lumbar back pain or lower extremity pain: M51.369

## 2024-09-12 NOTE — Patient Instructions (Signed)
 Your procedure is scheduled on:09-19-24 Thursday Report to the Registration Desk on the 1st floor of the Medical Mall.Then proceed to the 2nd floor Surgery Desk To find out your arrival time, please call [phone] between 1PM - 3PM on:09-18-24 Wednesday If your arrival time is 6:00 am, do not arrive before that time as the Medical Mall entrance doors do not open until 6:00 am.  REMEMBER: Instructions that are not followed completely Pollard result in serious medical risk, up to and including death; or upon the discretion of your surgeon and anesthesiologist your surgery Gillison need to be rescheduled.  Do not eat food OR drink liquids after midnight the night before surgery.  No gum chewing or hard candies.  One week prior to surgery:Stop NOW 09-12-24 Thursday Stop Anti-inflammatories (NSAIDS) such as Advil , Aleve, Ibuprofen , Motrin , Naproxen, Naprosyn and Aspirin  based products such as Excedrin, Goody's Powder, BC Powder. Stop ANY OVER THE COUNTER supplements until after surgery (Folic Acid)  You Gnau however, continue to take Tylenol /Percocet if needed for pain up until the day of surgery  Continue taking all of your other prescription medications up until the day of surgery.  ON THE DAY OF SURGERY ONLY TAKE THESE MEDICATIONS WITH SIPS OF WATER: -finasteride (PROSCAR)  -pantoprazole  (PROTONIX )  -tamsulosin (FLOMAX)   Continue your 81 mg Aspirin  up until the day prior to surgery-Do NOT take the day of surgery  No Alcohol for 24 hours before or after surgery.  No Smoking including e-cigarettes for 24 hours before surgery.  No chewable tobacco products for at least 6 hours before surgery.  No nicotine  patches on the day of surgery.  Do not use any recreational drugs for at least a week (preferably 2 weeks) before your surgery.  Please be advised that the combination of cocaine and anesthesia Ungaro have negative outcomes, up to and including death. If you test positive for cocaine, your  surgery will be cancelled.  On the morning of surgery brush your teeth with toothpaste and water, you Bosque rinse your mouth with mouthwash if you wish. Do not swallow any toothpaste or mouthwash.  Do not wear jewelry, make-up, hairpins, clips or nail polish.  For welded (permanent) jewelry: bracelets, anklets, waist bands, etc.  Please have this removed prior to surgery.  If it is not removed, there is a chance that hospital personnel will need to cut it off on the day of surgery.  Do not wear lotions, powders, or perfumes.   Do not shave body hair from the neck down 48 hours before surgery.  Contact lenses, hearing aids and dentures Crist not be worn into surgery.  Do not bring valuables to the hospital. [facility] is not responsible for any missing/lost belongings or valuables.   Notify your doctor if there is any change in your medical condition (cold, fever, infection).  Wear comfortable clothing (specific to your surgery type) to the hospital.  After surgery, you can help prevent lung complications by doing breathing exercises.  Take deep breaths and cough every 1-2 hours. Your doctor Lasker order a device called an Incentive Spirometer to help you take deep breaths. When coughing or sneezing, hold a pillow firmly against your incision with both hands. This is called "splinting." Doing this helps protect your incision. It also decreases belly discomfort.  If you are being admitted to the hospital overnight, leave your suitcase in the car. After surgery it Gan be brought to your room.  In case of increased patient census, it Wisby be  necessary for you, the patient, to continue your postoperative care in the Same Day Surgery department.  If you are being discharged the day of surgery, you will not be allowed to drive home. You will need a responsible individual to drive you home and stay with you for 24 hours after surgery.   If you are taking public transportation, you will need to have  a responsible individual with you.  Please call the Pre-admissions Testing Dept. at [phone] if you have any questions about these instructions.  Surgery Visitation Policy:  Patients having surgery or a procedure Coll have two visitors.  Children under the age of 94 must have an adult with them who is not the patient.   Merchandiser, Retail to address health-related social needs:  [email or web address]

## 2024-09-18 MED ORDER — CEFAZOLIN SODIUM-DEXTROSE 2-4 GM/100ML-% IV SOLN
2.0000 g | INTRAVENOUS | Status: AC
  Administered 2024-09-19: 2 g via INTRAVENOUS

## 2024-09-18 MED ORDER — CHLORHEXIDINE GLUCONATE 0.12 % MT SOLN
15.0000 mL | Freq: Once | OROMUCOSAL | Status: AC
  Administered 2024-09-19: 15 mL via OROMUCOSAL

## 2024-09-18 MED ORDER — LACTATED RINGERS IV SOLN: INTRAVENOUS | Status: DC

## 2024-09-18 MED ORDER — ORAL CARE MOUTH RINSE: 15.0000 mL | Freq: Once | OROMUCOSAL | Status: AC

## 2024-09-19 ENCOUNTER — Encounter: Payer: Self-pay | Admitting: Urology

## 2024-09-19 ENCOUNTER — Ambulatory Visit
Admission: RE | Admit: 2024-09-19 | Discharge: 2024-09-19 | Disposition: A | Discharge status: Home / Self Care | Attending: Urology | Admitting: Urology

## 2024-09-19 ENCOUNTER — Other Ambulatory Visit: Payer: Self-pay

## 2024-09-19 ENCOUNTER — Ambulatory Visit: Admitting: Certified Registered"

## 2024-09-19 ENCOUNTER — Ambulatory Visit

## 2024-09-19 ENCOUNTER — Encounter
Admission: RE | Disposition: A | Discharge status: Home / Self Care | Payer: Self-pay | Source: Home / Self Care | Attending: Urology

## 2024-09-19 DIAGNOSIS — F17210 Nicotine dependence, cigarettes, uncomplicated: Secondary | ICD-10-CM | POA: Insufficient documentation

## 2024-09-19 DIAGNOSIS — N401 Enlarged prostate with lower urinary tract symptoms: Secondary | ICD-10-CM | POA: Insufficient documentation

## 2024-09-19 DIAGNOSIS — Z9079 Acquired absence of other genital organ(s): Secondary | ICD-10-CM | POA: Diagnosis not present

## 2024-09-19 DIAGNOSIS — K219 Gastro-esophageal reflux disease without esophagitis: Secondary | ICD-10-CM | POA: Insufficient documentation

## 2024-09-19 DIAGNOSIS — N200 Calculus of kidney: Secondary | ICD-10-CM

## 2024-09-19 DIAGNOSIS — N201 Calculus of ureter: Secondary | ICD-10-CM

## 2024-09-19 DIAGNOSIS — N202 Calculus of kidney with calculus of ureter: Secondary | ICD-10-CM | POA: Insufficient documentation

## 2024-09-19 DIAGNOSIS — J439 Emphysema, unspecified: Secondary | ICD-10-CM | POA: Diagnosis not present

## 2024-09-19 HISTORY — PX: CYSTOSCOPY/URETEROSCOPY/HOLMIUM LASER/STENT PLACEMENT: SHX6546

## 2024-09-19 SURGERY — CYSTOSCOPY/URETEROSCOPY/HOLMIUM LASER/STENT PLACEMENT
Anesthesia: General | Site: Ureter | Laterality: Right

## 2024-09-19 MED ORDER — ACETAMINOPHEN 10 MG/ML IV SOLN
INTRAVENOUS | Status: DC | PRN
Start: 2024-09-19 — End: 2024-09-19
  Administered 2024-09-19: 1000 mg via INTRAVENOUS

## 2024-09-19 MED ORDER — CHLORHEXIDINE GLUCONATE 0.12 % MT SOLN
OROMUCOSAL | Status: AC
  Filled 2024-09-19: qty 15

## 2024-09-19 MED ORDER — OXYCODONE HCL 5 MG PO TABS
ORAL_TABLET | ORAL | Status: AC
  Filled 2024-09-19: qty 1

## 2024-09-19 MED ORDER — LIDOCAINE HCL (CARDIAC) PF 100 MG/5ML IV SOSY
PREFILLED_SYRINGE | INTRAVENOUS | Status: DC | PRN
  Administered 2024-09-19: 100 mg via INTRAVENOUS

## 2024-09-19 MED ORDER — ROCURONIUM BROMIDE 100 MG/10ML IV SOLN
INTRAVENOUS | Status: DC | PRN
  Administered 2024-09-19: 50 mg via INTRAVENOUS

## 2024-09-19 MED ORDER — ONDANSETRON HCL 4 MG/2ML IJ SOLN: 4.0000 mg | Freq: Once | INTRAMUSCULAR | Status: DC | PRN

## 2024-09-19 MED ORDER — FENTANYL CITRATE (PF) 100 MCG/2ML IJ SOLN
25.0000 ug | INTRAMUSCULAR | Status: DC | PRN
  Administered 2024-09-19 (×2): 25 ug via INTRAVENOUS

## 2024-09-19 MED ORDER — CEFAZOLIN SODIUM-DEXTROSE 2-4 GM/100ML-% IV SOLN
INTRAVENOUS | Status: AC
  Filled 2024-09-19: qty 100

## 2024-09-19 MED ORDER — MIDAZOLAM HCL (PF) 2 MG/2ML IJ SOLN
INTRAMUSCULAR | Status: DC | PRN
  Administered 2024-09-19: 2 mg via INTRAVENOUS

## 2024-09-19 MED ORDER — FENTANYL CITRATE (PF) 100 MCG/2ML IJ SOLN
INTRAMUSCULAR | Status: DC | PRN
  Administered 2024-09-19 (×2): 50 ug via INTRAVENOUS

## 2024-09-19 MED ORDER — DEXAMETHASONE SOD PHOSPHATE PF 10 MG/ML IJ SOLN
INTRAMUSCULAR | Status: DC | PRN
  Administered 2024-09-19: 10 mg via INTRAVENOUS

## 2024-09-19 MED ORDER — FENTANYL CITRATE (PF) 100 MCG/2ML IJ SOLN
INTRAMUSCULAR | Status: AC
  Filled 2024-09-19: qty 2

## 2024-09-19 MED ORDER — FENTANYL CITRATE (PF) 50 MCG/ML IJ SOSY
50.0000 ug | PREFILLED_SYRINGE | Freq: Once | INTRAMUSCULAR | Status: AC
  Administered 2024-09-19: 50 ug via INTRAVENOUS

## 2024-09-19 MED ORDER — FENTANYL CITRATE (PF) 50 MCG/ML IJ SOSY
PREFILLED_SYRINGE | INTRAMUSCULAR | Status: AC
  Filled 2024-09-19: qty 1

## 2024-09-19 MED ORDER — STERILE WATER FOR IRRIGATION IR SOLN
Status: DC | PRN
  Administered 2024-09-19: 500 mL

## 2024-09-19 MED ORDER — SODIUM CHLORIDE 0.9 % IR SOLN
Status: DC | PRN
  Administered 2024-09-19: 3000 mL

## 2024-09-19 MED ORDER — ACETAMINOPHEN 10 MG/ML IV SOLN
INTRAVENOUS | Status: AC
Start: 2024-09-19 — End: 2024-09-19
  Filled 2024-09-19: qty 100

## 2024-09-19 MED ORDER — OXYCODONE HCL 5 MG PO TABS
5.0000 mg | ORAL_TABLET | Freq: Once | ORAL | Status: AC | PRN
  Administered 2024-09-19: 5 mg via ORAL

## 2024-09-19 MED ORDER — IOHEXOL 180 MG/ML  SOLN
INTRAMUSCULAR | Status: DC | PRN
Start: 2024-09-19 — End: 2024-09-19
  Administered 2024-09-19: 10 mL

## 2024-09-19 MED ORDER — PHENYLEPHRINE 80 MCG/ML (10ML) SYRINGE FOR IV PUSH (FOR BLOOD PRESSURE SUPPORT)
PREFILLED_SYRINGE | INTRAVENOUS | Status: DC | PRN
  Administered 2024-09-19 (×3): 80 ug via INTRAVENOUS
  Administered 2024-09-19: 160 ug via INTRAVENOUS

## 2024-09-19 MED ORDER — OXYCODONE HCL 5 MG/5ML PO SOLN: 5.0000 mg | Freq: Once | ORAL | Status: AC | PRN

## 2024-09-19 MED ORDER — LIDOCAINE HCL (PF) 2 % IJ SOLN
INTRAMUSCULAR | Status: AC
  Filled 2024-09-19: qty 5

## 2024-09-19 MED ORDER — SUGAMMADEX SODIUM 200 MG/2ML IV SOLN
INTRAVENOUS | Status: DC | PRN
  Administered 2024-09-19: 160 mg via INTRAVENOUS

## 2024-09-19 MED ORDER — PHENYLEPHRINE 80 MCG/ML (10ML) SYRINGE FOR IV PUSH (FOR BLOOD PRESSURE SUPPORT)
PREFILLED_SYRINGE | INTRAVENOUS | Status: AC
  Filled 2024-09-19: qty 10

## 2024-09-19 MED ORDER — ONDANSETRON HCL 4 MG/2ML IJ SOLN
INTRAMUSCULAR | Status: DC | PRN
  Administered 2024-09-19: 4 mg via INTRAVENOUS

## 2024-09-19 MED ORDER — ONDANSETRON HCL 4 MG/2ML IJ SOLN
INTRAMUSCULAR | Status: AC
Start: 2024-09-19 — End: 2024-09-19
  Filled 2024-09-19: qty 2

## 2024-09-19 MED ORDER — ROCURONIUM BROMIDE 10 MG/ML (PF) SYRINGE
PREFILLED_SYRINGE | INTRAVENOUS | Status: AC
Start: 2024-09-19 — End: 2024-09-19
  Filled 2024-09-19: qty 10

## 2024-09-19 MED ORDER — MIDAZOLAM HCL 2 MG/2ML IJ SOLN
INTRAMUSCULAR | Status: AC
  Filled 2024-09-19: qty 2

## 2024-09-19 MED ORDER — PROPOFOL 10 MG/ML IV BOLUS
INTRAVENOUS | Status: DC | PRN
  Administered 2024-09-19: 120 mg via INTRAVENOUS

## 2024-09-19 SURGICAL SUPPLY — 22 items
BAG DRAIN SIEMENS DORNER NS (MISCELLANEOUS) ×1 IMPLANT
BASKET LASER NITINOL 1.9FR (BASKET) IMPLANT
BASKET ZERO TIP 1.9FR (BASKET) IMPLANT
BRUSH SCRUB EZ 4% CHG (MISCELLANEOUS) ×1 IMPLANT
CATH URET FLEX-TIP 2 LUMEN 10F (CATHETERS) IMPLANT
CATH URETL OPEN END 6X70 (CATHETERS) IMPLANT
DRAPE UTILITY 15X26 TOWEL STRL (DRAPES) ×1 IMPLANT
FIBER LASER MOSES 200 DFL (Laser) IMPLANT
GLOVE BIOGEL PI IND STRL 7.5 (GLOVE) ×1 IMPLANT
GOWN STRL REUS W/ TWL LRG LVL3 (GOWN DISPOSABLE) ×1 IMPLANT
GOWN STRL REUS W/ TWL XL LVL3 (GOWN DISPOSABLE) ×1 IMPLANT
GUIDEWIRE STR DUAL SENSOR (WIRE) ×1 IMPLANT
KIT TURNOVER CYSTO (KITS) ×1 IMPLANT
PACK CYSTO AR (MISCELLANEOUS) ×1 IMPLANT
SET CYSTO IRRIGATION (SET/KITS/TRAYS/PACK) ×1 IMPLANT
SHEATH NAVIGATOR HD 12/14X36 (SHEATH) IMPLANT
SOL .9 NS 3000ML IRR UROMATIC (IV SOLUTION) ×1 IMPLANT
SOLN STERILE WATER 500 ML (IV SOLUTION) ×1 IMPLANT
STENT URET 6FRX24 CONTOUR (STENTS) IMPLANT
STENT URET 6FRX26 CONTOUR (STENTS) IMPLANT
SURGILUBE 2OZ TUBE FLIPTOP (MISCELLANEOUS) ×1 IMPLANT
VALVE UROSEAL ADJ ENDO (VALVE) IMPLANT
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
## 2024-09-19 NOTE — Discharge Instructions (Signed)
 DISCHARGE INSTRUCTIONS FOR KIDNEY STONE/URETERAL STENT   MEDICATIONS:  1. Resume all your other meds from home.  2.  AZO (over-the-counter) can help with the burning/stinging when you urinate.   ACTIVITY:  1. Julson resume regular activities in 24 hours. 2. No driving while on narcotic pain medications  3. Drink plenty of water   4. Continue to walk at home - you can still get blood clots when you are at home, so keep active, but don't over do it.  5. Scroggs return to work/school tomorrow or when you feel ready   BATHING:  1. You can shower. 2. You have a string coming from your urethra: The stent string is attached to your ureteral stent. Do not pull on this.   SIGNS/SYMPTOMS TO CALL:  Common postoperative symptoms include urinary frequency, urgency, bladder spasm and blood in the urine  Please call us  if you have a fever greater than 101.5, uncontrolled nausea/vomiting, uncontrolled pain, dizziness, unable to urinate, excessively bloody urine, chest pain, shortness of breath, leg swelling, leg pain, or any other concerns or questions.   You can reach us  at [phone].   FOLLOW-UP:  1. You will be contacted for a postop follow-up appointment in ~1 month 2. You have a string attached to your stent, you Hollingworth remove it on Sunday, 09/22/2024. To do this, pull the string until the stent is completely removed. You Schlagel feel an odd sensation in your back.

## 2024-09-19 NOTE — Anesthesia Procedure Notes (Signed)
 Procedure Name: Intubation Date/Time: 09/19/2024 1:03 PM  Performed by: Jackye Spanner, CRNAPre-anesthesia Checklist: Patient identified, Patient being monitored, Timeout performed, Emergency Drugs available and Suction available Patient Re-evaluated:Patient Re-evaluated prior to induction Oxygen Delivery Method: Circle system utilized Preoxygenation: Pre-oxygenation with 100% oxygen Induction Type: IV induction Ventilation: Mask ventilation without difficulty Laryngoscope Size: McGrath and 3 Grade View: Grade I Tube type: Oral Tube size: 7.0 mm Number of attempts: 1 Airway Equipment and Method: Stylet Placement Confirmation: ETT inserted through vocal cords under direct vision, positive ETCO2 and breath sounds checked- equal and bilateral Secured at: 22 cm Tube secured with: Tape Dental Injury: Teeth and Oropharynx as per pre-operative assessment  Comments: Smooth atraumatic intubation, no complications noted.

## 2024-09-19 NOTE — Transfer of Care (Signed)
 Immediate Anesthesia Transfer of Care Note  Patient: [name]  Procedure(s) Performed: CYSTOSCOPY/URETEROSCOPY/HOLMIUM LASER/STENT PLACEMENT (Right: Ureter)  Patient Location: PACU  Anesthesia Type:General  Level of Consciousness: awake, alert , and drowsy  Airway & Oxygen Therapy: Patient Spontanous Breathing and Patient connected to face mask oxygen  Post-op Assessment: Report given to RN and Post -op Vital signs reviewed and stable  Post vital signs: Reviewed and stable  Last Vitals:  Vitals Value Taken Time  BP 126/77 09/19/24 14:42  Temp 35.9 1442  Pulse 86 09/19/24 14:44  Resp 16 09/19/24 14:44  SpO2 100 % 09/19/24 14:44  Vitals shown include unfiled device data.  Last Pain:  Vitals:   09/19/24 1145  TempSrc:   PainSc: 5          Complications: No notable events documented.

## 2024-09-19 NOTE — Interval H&P Note (Signed)
 History and Physical Interval Note:  Status post right ureteral stent placement for an obstructing 8 mm calculus with possible infection.  He presents today for definitive stone management.  In the interim he has developed urinary retention and has an indwelling Foley catheter.  09/19/2024 12:49 PM  [name]  has presented today for surgery, with the diagnosis of Right Ureteral Stone.  The various methods of treatment have been discussed with the patient and family. After consideration of risks, benefits and other options for treatment, the patient has consented to  Procedure(s) with comments: CYSTOSCOPY/URETEROSCOPY/HOLMIUM LASER/STENT PLACEMENT (Right) - EXCHANGE as a surgical intervention.  The patient's history has been reviewed, patient examined, no change in status, stable for surgery.  I have reviewed the patient's chart and labs.  Questions were answered to the patient's satisfaction.    CV: RRR Lungs: Clear  Kahner Yanik C Aasha Dina

## 2024-09-19 NOTE — Anesthesia Postprocedure Evaluation (Signed)
 Anesthesia Post Note  Patient: Reco Shonk Hole  Procedure(s) Performed: CYSTOSCOPY/URETEROSCOPY/HOLMIUM LASER/STENT PLACEMENT (Right: Ureter)  Patient location during evaluation: PACU Anesthesia Type: General Level of consciousness: awake and alert Pain management: pain level controlled Vital Signs Assessment: post-procedure vital signs reviewed and stable Respiratory status: spontaneous breathing, nonlabored ventilation and respiratory function stable Cardiovascular status: blood pressure returned to baseline and stable Postop Assessment: no apparent nausea or vomiting Anesthetic complications: no   No notable events documented.   Last Vitals:  Vitals:   09/19/24 1523 09/19/24 1539  BP: 131/78 136/76  Pulse: (!) 54 62  Resp: 11 16  Temp: 36.6 C 36.5 C  SpO2: 97% 98%    Last Pain:  Vitals:   09/19/24 1539  TempSrc: Temporal  PainSc: 4                  Fairy POUR Chayce Rullo

## 2024-09-19 NOTE — Op Note (Signed)
 Preoperative diagnosis:  Right proximal ureteral calculus Right nephrolithiasis  Postoperative diagnosis:  Same  Procedure:  Cystoscopy Right ureteroscopy and stone removal Ureteroscopic laser lithotripsy Right ureteral stent exchange (46F/24 cm) Right retrograde pyelography with interpretation  Surgeon: Glendia C. Zyquan Crotty, M.D.  Anesthesia: General  Complications: None  Intraoperative findings:  Cystoscopy: Urethra normal in caliber without stricture; prostate with prominent lateral lobe enlargement with intravesical extension of lateral lobes.  Bladder mucosa without solid or papillary lesions; inflammatory changes right UO secondary to indwelling stent Ureteroscopy: Not impacted right proximal ureteral calculus.  5 mm mid calyceal calculus.  Several calyces with Randall's plaques Right retrograde pyelography post procedure showed no filling defects, stone fragments or contrast extravasation  EBL: Minimal  Specimens: Calculus fragments for analysis   Indication: [name] is a 63 y.o. male status post right ureteral stent placement for an 8 mm right proximal ureteral calculus with suspected infection.  He presents today for definitive stone management.  Also noted to have nonobstructing right nephrolithiasis on CT.  Refer to the admission H&P for details. After reviewing the management options for treatment, the patient elected to proceed with the above surgical procedure(s). We have discussed the potential benefits and risks of the procedure, side effects of the proposed treatment, the likelihood of the patient achieving the goals of the procedure, and any potential problems that might occur during the procedure or recuperation. Informed consent has been obtained.  Description of procedure:  The patient was taken to the operating room and general anesthesia was induced.  The patient was placed in the dorsal lithotomy position, prepped and draped in the usual sterile  fashion, and preoperative antibiotics were administered. A preoperative time-out was performed.   A 21 French cystoscope was lubricated, placed per urethra and advanced into the bladder under direct vision with findings as described above.    The right ureteral stent was grasped with endoscopic forceps and brought out through the urethral meatus however a 0.038 Sensor wire would not advance through the stent.  The cystoscope was repassed and the Sensor wire was advanced into the right UO alongside the indwelling stent and advanced into the renal pelvis under fluoroscopic guidance.  The cystoscope and ureteral stent were removed  A 4.5 Fr semirigid ureteroscope was then advanced into the ureter next to the guidewire and a non-impacted calculus was identified in the midportion of the proximal ureter.  The stone was placed in an Escape basket and was able to be advanced to the lower proximal ureter.  The basket was then opened and the stone was dusted within the open basket with a 200 m Moses holmium laser fiber at a setting of 0.3 J/40 hz.   All fragments were then removed from the ureter with a zero tip nitinol basket.  Reinspection of the ureter revealed no remaining visible stones or fragments.  A small fragment was sent for analysis.  Prior to removal of the semirigid ureteroscope a second guidewire was placed.  A single channel digital flexible ureteroscope was advanced over the working wire into the ureter.  Once in the lower proximal ureter the guidewire is removed and the ureteroscope was advanced into the renal pelvis.  Pyeloscopy was performed with findings as described above.  The mid calyceal calculus was dusted with a 200 m fiber at a setting of 0.3 J/80 Hz.  A few fragments that settled within the lower pole calyx were further dusted.  Retrograde pyelogram was performed with findings as described above.  All calyces were examined and fluoroscopic guidance and no fragments larger than 1 mm  were identified.  The ureteroscope was removed under direct vision and no significant ureteral fragments were noted.  A 6 F/24 cm Contour ureteral stent with tether was placed under fluoroscopic guidance.  The wire was then removed with an adequate stent curl noted in the renal pelvis as well as in the bladder.  The tether was secured to the dorsum of the penis with Mastisol and Tegaderm.  The bladder was then emptied and the procedure ended.  The patient appeared to tolerate the procedure well and without complications.  After anesthetic reversal the patient was transported to the PACU in stable condition.   Plan: He was instructed to remove his stent on Sunday, 09/22/2024 Postop follow-up will be scheduled in 1 month   Glendia Barba, MD

## 2024-09-20 ENCOUNTER — Encounter: Payer: Self-pay | Admitting: Urology

## 2024-09-23 ENCOUNTER — Other Ambulatory Visit: Payer: Self-pay

## 2024-09-23 ENCOUNTER — Ambulatory Visit
Admission: RE | Admit: 2024-09-23 | Discharge: 2024-09-23 | Disposition: A | Discharge status: Home / Self Care | Source: Ambulatory Visit | Attending: Urology | Admitting: Urology

## 2024-09-23 ENCOUNTER — Ambulatory Visit

## 2024-09-23 ENCOUNTER — Ambulatory Visit: Admitting: Anesthesiology

## 2024-09-23 ENCOUNTER — Encounter: Payer: Self-pay | Admitting: Urology

## 2024-09-23 ENCOUNTER — Encounter
Admission: RE | Disposition: A | Discharge status: Home / Self Care | Payer: Self-pay | Source: Ambulatory Visit | Attending: Urology

## 2024-09-23 ENCOUNTER — Ambulatory Visit: Admitting: Urology

## 2024-09-23 DIAGNOSIS — J439 Emphysema, unspecified: Secondary | ICD-10-CM | POA: Insufficient documentation

## 2024-09-23 DIAGNOSIS — K219 Gastro-esophageal reflux disease without esophagitis: Secondary | ICD-10-CM | POA: Diagnosis not present

## 2024-09-23 DIAGNOSIS — T83122S Displacement of urinary stent, sequela: Secondary | ICD-10-CM

## 2024-09-23 DIAGNOSIS — N201 Calculus of ureter: Secondary | ICD-10-CM | POA: Insufficient documentation

## 2024-09-23 DIAGNOSIS — F17210 Nicotine dependence, cigarettes, uncomplicated: Secondary | ICD-10-CM | POA: Diagnosis not present

## 2024-09-23 DIAGNOSIS — T83122A Displacement of urinary stent, initial encounter: Secondary | ICD-10-CM

## 2024-09-23 DIAGNOSIS — N400 Enlarged prostate without lower urinary tract symptoms: Secondary | ICD-10-CM | POA: Insufficient documentation

## 2024-09-23 DIAGNOSIS — Z466 Encounter for fitting and adjustment of urinary device: Secondary | ICD-10-CM | POA: Insufficient documentation

## 2024-09-23 DIAGNOSIS — N132 Hydronephrosis with renal and ureteral calculous obstruction: Secondary | ICD-10-CM

## 2024-09-23 HISTORY — PX: CYSTOSCOPY W/ URETERAL STENT REMOVAL: SHX1430

## 2024-09-23 SURGERY — REMOVAL, STENT, URETER, CYSTOSCOPIC
Anesthesia: General | Site: Ureter | Laterality: Right

## 2024-09-23 MED ORDER — FENTANYL CITRATE (PF) 100 MCG/2ML IJ SOLN
INTRAMUSCULAR | Status: AC
  Filled 2024-09-23: qty 2

## 2024-09-23 MED ORDER — FENTANYL CITRATE (PF) 100 MCG/2ML IJ SOLN
INTRAMUSCULAR | Status: DC | PRN
  Administered 2024-09-23: 100 ug via INTRAVENOUS

## 2024-09-23 MED ORDER — OXYCODONE HCL 5 MG PO TABS
5.0000 mg | ORAL_TABLET | Freq: Once | ORAL | Status: AC | PRN
  Administered 2024-09-23: 5 mg via ORAL

## 2024-09-23 MED ORDER — STERILE WATER FOR IRRIGATION IR SOLN
Status: DC | PRN
  Administered 2024-09-23: 500 mL

## 2024-09-23 MED ORDER — MIDAZOLAM HCL 2 MG/2ML IJ SOLN
INTRAMUSCULAR | Status: AC
  Filled 2024-09-23: qty 2

## 2024-09-23 MED ORDER — OXYCODONE HCL 5 MG PO TABS
ORAL_TABLET | ORAL | Status: AC
  Filled 2024-09-23: qty 1

## 2024-09-23 MED ORDER — FENTANYL CITRATE (PF) 50 MCG/ML IJ SOSY
50.0000 ug | PREFILLED_SYRINGE | Freq: Once | INTRAMUSCULAR | Status: AC
  Administered 2024-09-23: 50 ug via INTRAVENOUS

## 2024-09-23 MED ORDER — ONDANSETRON HCL 4 MG/2ML IJ SOLN
INTRAMUSCULAR | Status: DC | PRN
  Administered 2024-09-23: 4 mg via INTRAVENOUS

## 2024-09-23 MED ORDER — HYDROMORPHONE HCL 1 MG/ML IJ SOLN
0.5000 mg | INTRAMUSCULAR | Status: DC | PRN
  Administered 2024-09-23 (×3): 0.5 mg via INTRAVENOUS

## 2024-09-23 MED ORDER — HYDROMORPHONE HCL 1 MG/ML IJ SOLN
INTRAMUSCULAR | Status: AC
  Filled 2024-09-23: qty 1

## 2024-09-23 MED ORDER — PHENYLEPHRINE 80 MCG/ML (10ML) SYRINGE FOR IV PUSH (FOR BLOOD PRESSURE SUPPORT)
PREFILLED_SYRINGE | INTRAVENOUS | Status: DC | PRN
  Administered 2024-09-23: 160 ug via INTRAVENOUS

## 2024-09-23 MED ORDER — CHLORHEXIDINE GLUCONATE 0.12 % MT SOLN
15.0000 mL | Freq: Once | OROMUCOSAL | Status: AC
  Administered 2024-09-23: 15 mL via OROMUCOSAL

## 2024-09-23 MED ORDER — PROPOFOL 10 MG/ML IV BOLUS
INTRAVENOUS | Status: DC | PRN
  Administered 2024-09-23 (×2): 50 mg via INTRAVENOUS

## 2024-09-23 MED ORDER — CHLORHEXIDINE GLUCONATE 0.12 % MT SOLN
OROMUCOSAL | Status: AC
  Filled 2024-09-23: qty 15

## 2024-09-23 MED ORDER — PROPOFOL 10 MG/ML IV BOLUS
INTRAVENOUS | Status: AC
  Filled 2024-09-23: qty 40

## 2024-09-23 MED ORDER — LACTATED RINGERS IV SOLN: INTRAVENOUS | Status: DC

## 2024-09-23 MED ORDER — HYDROMORPHONE HCL 1 MG/ML IJ SOLN
1.0000 mg | Freq: Once | INTRAMUSCULAR | Status: AC
  Administered 2024-09-23: 1 mg via INTRAVENOUS

## 2024-09-23 MED ORDER — OXYCODONE HCL 5 MG/5ML PO SOLN: 5.0000 mg | Freq: Once | ORAL | Status: AC | PRN

## 2024-09-23 MED ORDER — FENTANYL CITRATE (PF) 100 MCG/2ML IJ SOLN
25.0000 ug | INTRAMUSCULAR | Status: DC | PRN
  Administered 2024-09-23: 25 ug via INTRAVENOUS
  Administered 2024-09-23: 50 ug via INTRAVENOUS
  Administered 2024-09-23: 25 ug via INTRAVENOUS

## 2024-09-23 MED ORDER — MIDAZOLAM HCL (PF) 2 MG/2ML IJ SOLN
INTRAMUSCULAR | Status: DC | PRN
  Administered 2024-09-23: 2 mg via INTRAVENOUS

## 2024-09-23 MED ORDER — DEXAMETHASONE SOD PHOSPHATE PF 10 MG/ML IJ SOLN
INTRAMUSCULAR | Status: DC | PRN
  Administered 2024-09-23: 5 mg via INTRAVENOUS

## 2024-09-23 MED ORDER — PROPOFOL 500 MG/50ML IV EMUL
INTRAVENOUS | Status: DC | PRN
  Administered 2024-09-23: 100 ug/kg/min via INTRAVENOUS

## 2024-09-23 MED ORDER — ORAL CARE MOUTH RINSE: 15.0000 mL | Freq: Once | OROMUCOSAL | Status: AC

## 2024-09-23 MED ORDER — CEFAZOLIN SODIUM-DEXTROSE 2-4 GM/100ML-% IV SOLN
INTRAVENOUS | Status: AC
  Filled 2024-09-23: qty 100

## 2024-09-23 MED ORDER — SODIUM CHLORIDE 0.9 % IR SOLN
Status: DC | PRN
  Administered 2024-09-23: 3000 mL

## 2024-09-23 MED ORDER — FENTANYL CITRATE (PF) 50 MCG/ML IJ SOSY
PREFILLED_SYRINGE | INTRAMUSCULAR | Status: AC
  Filled 2024-09-23: qty 1

## 2024-09-23 MED ORDER — SODIUM CHLORIDE 0.9 % IV SOLN
1.0000 g | Freq: Once | INTRAVENOUS | Status: AC
  Administered 2024-09-23: 1 g via INTRAVENOUS
  Filled 2024-09-23: qty 10

## 2024-09-23 SURGICAL SUPPLY — 10 items
BAG DRAIN SIEMENS DORNER NS (MISCELLANEOUS) ×1 IMPLANT
GLOVE BIOGEL PI IND STRL 7.5 (GLOVE) ×1 IMPLANT
GOWN STRL REUS W/ TWL LRG LVL3 (GOWN DISPOSABLE) ×2 IMPLANT
GOWN STRL REUS W/ TWL XL LVL3 (GOWN DISPOSABLE) ×1 IMPLANT
GUIDEWIRE STR DUAL SENSOR (WIRE) ×1 IMPLANT
PACK CYSTO AR (MISCELLANEOUS) ×1 IMPLANT
SET CYSTO IRRIGATION (SET/KITS/TRAYS/PACK) ×1 IMPLANT
SOL .9 NS 3000ML IRR UROMATIC (IV SOLUTION) ×1 IMPLANT
SOLN STERILE WATER 500 ML (IV SOLUTION) ×1 IMPLANT
SURGILUBE 2OZ TUBE FLIPTOP (MISCELLANEOUS) ×1 IMPLANT
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
## 2024-09-23 NOTE — Anesthesia Postprocedure Evaluation (Signed)
 Anesthesia Post Note  Patient: [name]  Procedure(s) Performed: REMOVAL, STENT, URETER, CYSTOSCOPIC (Right: Ureter)  Patient location during evaluation: PACU Anesthesia Type: General Level of consciousness: awake and alert Pain management: pain level controlled Vital Signs Assessment: post-procedure vital signs reviewed and stable Respiratory status: spontaneous breathing, nonlabored ventilation, respiratory function stable and patient connected to nasal cannula oxygen Cardiovascular status: blood pressure returned to baseline and stable Postop Assessment: no apparent nausea or vomiting Anesthetic complications: no   No notable events documented.   Last Vitals:  Vitals:   09/23/24 1330 09/23/24 1345  BP: 112/69 119/79  Pulse: 69 69  Resp: 13 16  Temp:  (!) 36.1 C  SpO2: 93% 93%    Last Pain:  Vitals:   09/23/24 1345  PainSc: 3                  [name]

## 2024-09-23 NOTE — Discharge Instructions (Signed)
 Your urinary symptoms and pain should improve rapidly Call our office at [phone] for persistent pain To Emergency Department after hours for severe pain

## 2024-09-23 NOTE — Interval H&P Note (Signed)
 History and Physical Interval Note:  09/23/2024 12:09 PM  [name]  has presented today for surgery, with the diagnosis of right indwelling ureteral stent.  The various methods of treatment have been discussed with the patient and family. After consideration of risks, benefits and other options for treatment, the patient has consented to  Procedure(s): REMOVAL, STENT, URETER, CYSTOSCOPIC (Right) as a surgical intervention.  The patient's history has been reviewed, patient examined, no change in status, stable for surgery.  I have reviewed the patient's chart and labs.  Questions were answered to the patient's satisfaction.    CV:RRR Lungs:clear  Glendia [name]

## 2024-09-23 NOTE — Op Note (Signed)
   Preoperative diagnosis:  Indwelling ureteral stent  Postoperative diagnosis:  Same  Procedure: Cystoscopy with removal ureteral stent  Surgeon: [name], MD  Anesthesia: MAC  Complications: None  Intraoperative findings:  Prominent lateral lobe enlargement prostate Indwelling stent removed without difficulty  EBL: 0 mL  Specimens: None  Indication: [name] is a 63 y.o. male status post right ureteroscopic stone removal 09/19/2024.  His stent was left on a tether which he was instructed to remove on 11/23.  When he removed the stent not only the tether was removed without the stent attached.  Attempt at office removal with a flexible cystoscope was unable to be performed secondary to significant BPH and patient discomfort.  After reviewing the management options for treatment, he elected to proceed with the above surgical procedure(s). We have discussed the potential benefits and risks of the procedure, side effects of the proposed treatment, the likelihood of the patient achieving the goals of the procedure, and any potential problems that might occur during the procedure or recuperation. Informed consent has been obtained.  Description of procedure:  The patient was taken to the operating room and IV sedation was obtained by anesthesia.  The patient was placed in the dorsal lithotomy position, prepped and draped in the usual sterile fashion, and preoperative antibiotics were administered. A preoperative time-out was performed.   A 21 French cystoscope with 30 degree lens was lubricated and placed per urethra.  Marked lateral lobe enlargement with a median lobe was noted.  The stent was visualized, grasped endoscopic forceps and removed without difficulty  He was then transported to the PACU in stable condition  Plan: Postop office follow-up will be scheduled in ~1 month  [name], M.D.

## 2024-09-23 NOTE — Transfer of Care (Cosign Needed)
 Immediate Anesthesia Transfer of Care Note  Patient: [name]  Procedure(s) Performed: REMOVAL, STENT, URETER, CYSTOSCOPIC (Right: Ureter)  Patient Location: PACU  Anesthesia Type:General  Level of Consciousness: awake, drowsy, and patient cooperative  Airway & Oxygen Therapy: Patient Spontanous Breathing and Patient connected to face mask oxygen  Post-op Assessment: Report given to RN and Post -op Vital signs reviewed and stable  Post vital signs: Reviewed and stable  Last Vitals:  Vitals Value Taken Time  BP 94/66 09/23/24 12:34  Temp    Pulse 90 09/23/24 12:41  Resp 17 09/23/24 12:41  SpO2 91 % 09/23/24 12:41  Vitals shown include unfiled device data.  Last Pain:  Vitals:   09/23/24 1234  PainSc: (P) Asleep         Complications: No notable events documented.

## 2024-09-24 ENCOUNTER — Encounter: Payer: Self-pay | Admitting: Urology

## 2024-10-02 LAB — STONE ANALYSIS
Calcium Oxalate Dihydrate: 60 %
Calcium Oxalate Monohydrate: 40 %
Weight Calculi: 8 mg

## 2024-10-29 ENCOUNTER — Ambulatory Visit: Admitting: Urology

## 2024-10-29 VITALS — BP 100/70 | HR 77 | Ht 72.0 in | Wt 171.0 lb

## 2024-10-29 DIAGNOSIS — N401 Enlarged prostate with lower urinary tract symptoms: Secondary | ICD-10-CM | POA: Diagnosis not present

## 2024-10-29 DIAGNOSIS — N200 Calculus of kidney: Secondary | ICD-10-CM

## 2024-10-29 DIAGNOSIS — N201 Calculus of ureter: Secondary | ICD-10-CM
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
## 2024-10-29 NOTE — Patient Instructions (Addendum)
 Schedule [id number] u/s   Holmium Laser Enucleation of the Prostate (HoLEP)  HoLEP is a treatment for men with benign prostatic hyperplasia (BPH). The laser surgery removed blockages of urine flow, and is done without any incisions on the body.     What is HoLEP?  HoLEP is a type of laser surgery used to treat obstruction (blockage) of urine flow as a result of benign prostatic hyperplasia (BPH). In men with BPH, the prostate gland is not cancerous, but has become enlarged. An enlarged prostate can result in a number of urinary tract symptoms such as weak urinary stream, difficulty in starting urination, inability to urinate, frequent urination, or getting up at night to urinate.  HoLEP was developed in the 1990's as a more effective and less expensive surgical option for BPH, compared to other surgical options such as laser vaporization(PVP/greenlight laser), transurethral resection of the prostate(TURP), and open simple prostatectomy.   What happens during a HoLEP?  HoLEP requires general anesthesia (asleep throughout the procedure).   An antibiotic is given to reduce the risk of infection  A surgical instrument called a resectoscope is inserted through the urethra (the tube that carries urine from the bladder). The resectoscope has a camera that allows the surgeon to view the internal structure of the prostate gland, and to see where the incisions are being made during surgery.  The laser is inserted into the resectoscope and is used to enucleate (free up) the enlarged prostate tissue from the capsule (outer shell) and then to seal up any blood vessels. The tissue that has been removed is pushed back into the bladder.  A morcellator is placed through the resectoscope, and is used to suction out the prostate tissue that has been pushed into the bladder.  When the prostate tissue has been removed, the resectoscope is removed, and a foley catheter is placed to allow healing and drain  the urine from the bladder.     What happens after a HoLEP?  More than 95% of patients go home the same day a few hours after surgery. Less than 5% will be admitted to the hospital overnight for observation to monitor the urine, or if they have other medical problems.  Fluid is flushed through the catheter for about 1 hour after surgery to clear any blood from the urine. It is normal to have some blood in the urine after surgery. The need for blood transfusion is extremely rare.  Eating and drinking are permitted after the procedure once the patient has fully awakened from anesthesia.  The catheter is usually removed 2-3 days after surgery- the patient will come to clinic to have the catheter removed and make sure they can urinate on their own.  It is very important to drink lots of fluids after surgery for one week to keep the bladder flushed.  At first, there Pauling be some burning with urination, but this typically improved within a few hours to days. Most patients do not have a significant amount of pain, and narcotic pain medications are rarely needed.  Symptoms of urinary frequency, urgency, and even leakage are NORMAL for the first few weeks after surgery as the bladder adjusts after having to work hard against blockage from the prostate for many years. This will improve, but can sometimes take several months.  The use of pelvic floor exercises (Kegel exercises) can help improve problems with urinary incontinence.   After catheter removal, patients will be seen at 12 weeks and 6 months for symptom check  No heavy lifting for at least 2-3 weeks after surgery, however patients can walk and do light activities the first day after surgery. Return to work time depends on occupation.    What are the advantages of HoLEP?  HoLEP has been studied in many different parts of the world and has been shown to be a safe and effective procedure. Although there are many types of BPH surgeries  available, HoLEP offers a unique advantage in being able to remove a large amount of tissue without any incisions on the body, even in very large prostates, while decreasing the risk of bleeding and providing tissue for pathology (to look for cancer). This decreases the need for blood transfusions during surgery, minimizes hospital stay, and reduces the risk of needing repeat treatment.  What are the side effects of HoLEP?  Temporary burning and bleeding during urination. Some blood Luckey be seen in the urine for weeks after surgery and is part of the healing process.  Urinary incontinence (inability to control urine flow) is expected in all patients immediately after surgery and they should wear pads for the first few days/weeks. This typically improves over the course of several weeks. Performing Kegel exercises can help decrease leakage from stress maneuvers such as coughing, sneezing, or lifting. The rate of long term leakage is very low, 1-2%. Patients Bonaventure also have leakage with urgency and this Issa be treated with medication. The risk of urge incontinence can be dependent on several factors including age, prostate size, symptoms, and other medical problems.  Retrograde ejaculation or backwards ejaculation. In 80% of cases, the patient will not see any fluid during ejaculation after surgery.  Erectile function is generally not significantly affected.   What are the risks of HoLEP?  Injury to the urethra or development of scar tissue at a later date  Injury to the capsule of the prostate (typically treated with longer catheterization).  Injury to the bladder or ureteral orifices (where the urine from the kidney drains out)  Infection of the bladder, testes, or kidneys (~4%)  Return of urinary obstruction at a later date requiring another operation (<2%)  Need for blood transfusion or re-operation due to bleeding  Failure to relieve all symptoms and/or need for prolonged catheterization  after surgery  5-15% of patients are found to have previously undiagnosed prostate cancer in their specimen. Prostate cancer can be treated after HoLEP.  Standard risks of anesthesia including blood clots, heart attacks, etc ~1-2% risk of long term urinary incontinence (leakage)  When should I call my doctor?  Fever over 101.3 degrees  Inability to urinate, or large blood clots in the urine     [facility] Instructions [facility]   You will receive a box/kit in the mail that will have a urine jug and instructions in the kit.  When the box arrives you will need to call our office [phone] to schedule a LAB appointment.   You will need to do a 24hour urine and this should be done during the days that our office will be open.  For example any day from Sunday through Thursday.   If you take Vitamin C 100mg  or greater please stop this 5 days prior to collection.   How to collect the urine sample: On the day you start the urine sample this 1st morning urine should NOT be collected.  For the rest of the day including all night urines should be collected.  On the next morning the 1st urine should be  collected and then you will be finished with the urine collections.   You will need to bring the box with you on your LAB appointment day after urine has been collected and all instructions are complete in the box.  Your blood will be drawn and the box will be collected by our Lab employee to be sent off for analysis.   When urine and blood is complete you will need to schedule a follow up appointment for lab results.

## 2024-10-30 ENCOUNTER — Ambulatory Visit: Admission: RE | Admit: 2024-10-30 | Source: Ambulatory Visit

## 2024-10-30 ENCOUNTER — Encounter: Payer: Self-pay | Admitting: Urology

## 2024-11-05 ENCOUNTER — Other Ambulatory Visit: Payer: Self-pay

## 2024-11-05 DIAGNOSIS — N401 Enlarged prostate with lower urinary tract symptoms: Secondary | ICD-10-CM

## 2024-11-05 NOTE — Progress Notes (Signed)
 Surgical Physician Order Form [facility] [place]  * Scheduling expectation : Next Available- [place]  *Length of Case: 90 minutes  *Clearance needed: no  *Anticoagulation Instructions: N/A  *Aspirin  Instructions: Hold Aspirin   *Post-op visit Date/Instructions:  3 day cath removal  *Diagnosis: BPH w/LUTS  *Procedure:  HOLEP (47350)   Additional orders: N/A  -Admit type: OUTpatient  -Anesthesia: General  -VTE Prophylaxis Standing Order SCD's       Other:   -Standing Lab Orders Per Anesthesia    Lab other: UA&Urine Culture  -Standing Test orders EKG/Chest x-ray per Anesthesia       Test other:   - Medications:  Ancef  2gm IV  -Other orders:  N/A

## 2024-11-06 ENCOUNTER — Ambulatory Visit
Admission: RE | Admit: 2024-11-06 | Discharge: 2024-11-06 | Disposition: A | Discharge status: Home / Self Care | Source: Ambulatory Visit | Attending: Urology | Admitting: Urology

## 2024-11-06 DIAGNOSIS — N200 Calculus of kidney: Secondary | ICD-10-CM | POA: Insufficient documentation

## 2024-11-12 NOTE — Progress Notes (Signed)
 Patient states that he would like to hold off on surgery until issue with Kidney is cleared up. Patient will call back when he is ready to schedule surgery.

## 2024-11-13 ENCOUNTER — Other Ambulatory Visit

## 2024-11-13 DIAGNOSIS — N200 Calculus of kidney: Secondary | ICD-10-CM

## 2024-11-14 LAB — LITHOLINK SERUM PANEL
CO2: 20 mmol/L (ref 20–29)
Calcium: 9.1 mg/dL (ref 8.6–10.2)
Chloride: 102 mmol/L (ref 96–106)
Creatinine, Ser: 1.12 mg/dL (ref 0.76–1.27)
Magnesium: 1.9 mg/dL (ref 1.6–2.3)
Phosphorus: 2.6 mg/dL — ABNORMAL LOW (ref 2.8–4.1)
Potassium: 4.2 mmol/L (ref 3.5–5.2)
Sodium: 138 mmol/L (ref 134–144)
Uric Acid: 4.1 mg/dL (ref 3.8–8.4)
eGFR: 73 mL/min/1.73

## 2024-11-15 ENCOUNTER — Ambulatory Visit: Admitting: Urology

## 2024-11-15 ENCOUNTER — Ambulatory Visit: Payer: Self-pay | Admitting: Urology

## 2024-11-15 VITALS — BP 138/84 | HR 94 | Ht 72.0 in | Wt 174.0 lb

## 2024-11-15 DIAGNOSIS — N200 Calculus of kidney: Secondary | ICD-10-CM

## 2024-11-15 DIAGNOSIS — N201 Calculus of ureter: Secondary | ICD-10-CM

## 2024-11-15 DIAGNOSIS — R399 Unspecified symptoms and signs involving the genitourinary system: Secondary | ICD-10-CM

## 2024-11-15 DIAGNOSIS — R102 Pelvic and perineal pain unspecified side: Secondary | ICD-10-CM

## 2024-11-15 LAB — URINALYSIS, COMPLETE
Bilirubin, UA: NEGATIVE
Glucose, UA: NEGATIVE
Ketones, UA: NEGATIVE
Leukocytes,UA: NEGATIVE
Nitrite, UA: NEGATIVE
Protein,UA: NEGATIVE
Specific Gravity, UA: 1.025 (ref 1.005–1.030)
Urobilinogen, Ur: 0.2 mg/dL (ref 0.2–1.0)
pH, UA: 6 (ref 5.0–7.5)

## 2024-11-15 LAB — MICROSCOPIC EXAMINATION

## 2024-11-15 NOTE — Patient Instructions (Signed)
 SABRA

## 2024-11-15 NOTE — Progress Notes (Signed)
" ° °  11/15/2024 8:21 AM   [name] 03-22-1961 [id number]  Reason for visit: Follow up nephrolithiasis, BPH, abdominal pain  History: Extensive history of chronic groin/flank pain, has been managed by general surgery with multiple prior hernia repairs and injections.  Also has a history of a right orchiectomy for chronic pain with Dr. Carolee Extensive history of prior kidney stones, underwent right ureteroscopy and laser lithotripsy of an 8 mm mid ureteral stone in November 2025 with Dr. Twylla, stent has since been removed and recent renal ultrasound showed no hydronephrosis or abnormalities Cystoscopy for microscopic hematuria October 2025 with enlarged prostate but otherwise benign Has been on Flomax  and finasteride  for maximal medical therapy, prostate volume 67 g on recent CT PSA normal 0.82 from March 2025  Physical Exam: BP 138/84 (BP Location: Left Arm, Patient Position: Sitting, Cuff Size: Normal)   Pulse 94   Ht 6' (1.829 m)   Wt 174 lb (78.9 kg)   SpO2 98%   BMI 23.60 kg/m   Imaging/labs: I personally viewed and interpreted the CT scan from November 2025 showing a 67 g prostate, decompressed bladder, right mid ureteral stone with hydronephrosis(has since been treated with ureteroscopy by Dr. Twylla).   Also reviewed the renal ultrasound from 11/06/24 with no abnormalities or hydronephrosis Urinalysis today benign aside from 3-10 RBCs stable chronic microscopic hematuria  Today: He continues to have pain on his right side, really no flank or upper back pain, no testicular pain.  His pain seems to be worse with strenuous activity and at the end of the day Denies any significant urinary symptoms, occasional urgency, but overall very well-controlled on Flomax /finasteride , PVR today normal at 15ml  Plan:   Abdominal pain: Reassurance provided regarding normal urinalysis, normal bladder emptying, normal renal ultrasound.  Unlikely to be urologic in nature.  He has follow-up  already with general surgery who has performed some injections and is considering hernia repair PSA: Has been normal, continue screening every other year BPH/LUTS: Continue Flomax  and finasteride , prostate volume 67 g, could potentially benefit from HOLEP in the future if worsening urinary symptoms, but with his chronic pain and well-controlled symptoms at this time would not recommend aggressively pursuing HOLEP Nephrolithiasis: 24-hour urine metabolic workup ordered by Dr. Twylla is still pending, stone analysis showed calcium  oxalate, will contact with those results Keep follow-up as scheduled September 2026 for PVR   Brett JAYSON Burnet, MD  [facility] [address] [place], [place] [address] [phone]  "

## 2024-11-20 LAB — LITHOLINK 24HR URINE PANEL
Ammonium, Urine: 22 mmol/(24.h) (ref 15–60)
Calcium Oxalate Saturation: 9.77 (ref 6.00–10.00)
Calcium Phosphate Saturation: 4.44 — ABNORMAL HIGH (ref 0.50–2.00)
Calcium, Urine: 164 mg/(24.h)
Calcium/Creatinine Ratio: 128 mg/g{creat} (ref 34–196)
Calcium/Kg Body Weight: 2.1 mg/kg/d
Chloride, Urine: 151 mmol/(24.h) (ref 70–250)
Citrate, Urine: 701 mg/(24.h)
Creatinine, Urine: 1290 mg/(24.h)
Creatinine/Kg Body Weight: 16.2 mg/kg/d (ref 11.9–24.4)
Cystine, Urine, Qualitative: NEGATIVE
Magnesium, Urine: 52 mg/(24.h) (ref 30–120)
Oxalate, Urine: 26 mg/(24.h) (ref 20–40)
Phosphorus, Urine: 687 mg/(24.h) (ref 600–1200)
Potassium, Urine: 46 mmol/(24.h) (ref 20–100)
Protein Catabolic Rate: 0.6 g/kg/d — ABNORMAL LOW (ref 0.8–1.4)
Sodium, Urine: 166 mmol/(24.h) — ABNORMAL HIGH (ref 50–150)
Sulfate, Urine: 18 meq/(24.h) — ABNORMAL LOW (ref 20–80)
Urea Nitrogen, Urine: 5.81 g/(24.h) — ABNORMAL LOW (ref 6.00–14)
Uric Acid Saturation: 0.25
Uric Acid, Urine: 690 mg/(24.h)
Urine Volume (Preserved): 880 mL/(24.h) (ref 500–4000)
pH, 24 hr, Urine: 6.855 — ABNORMAL HIGH (ref 5.800–6.200)

## 2024-11-26 NOTE — Progress Notes (Signed)
 "  PROVIDER:  VICENTA DASIE POLI, MD  MRN: [id number] DOB: September 03, 1961 DATE OF ENCOUNTER: 11/26/2024 Subjective     Chief Complaint: Groin Pain     History of Present Illness: [name] is a 63 y.o. male who is seen today for another follow-up regarding his chronic groin pain.  Again I last operated on him in September of last year where I removed some suture material in the most lateral aspect of his inguinal incision.  There was no mesh in this area.  I did a neurectomy in this area because of his chronic pain and then loosely approximated the tissue around the area.  This is a small incision.  In November he presented with a kidney stone obstructing his ureter and had surgery to remove that.  He has a past history of having had an inguinal hernia repair with mesh which I performed more than 10 years ago.  Because of chronic accumulation of seromas and chronic pain I eventually had to remove the mesh and had to explore his groin several times for chronic pain.  I have been intermittently injecting a mixture of Marcaine  and lidocaine  into the area over the years for discomfort as well.  He is here today for another recheck.  He has noticed a small bulge at the anterior superior iliac spine area of the incision.  He reports only mild discomfort in the rest of the inguinal incision.     Review of Systems: A complete review of systems was obtained from the patient.  I have reviewed this information and discussed as appropriate with the patient.  See HPI as well for other ROS.  ROS    Medical History: Past Medical History:  Diagnosis Date   Allergy 2019   Seasonal   Aortic atherosclerosis 10/2020   Cervical disc disease 03/31/2014   COPD (chronic obstructive pulmonary disease) (CMS/HHS-HCC) 11/26/2020   COVID-19 10/2020   DDD (degenerative disc disease), lumbar    Emphysema of lung (CMS/HHS-HCC)    Kidney stones 03/31/2014   Right thyroid  nodule 11/12/2020   Thoracic  spondylosis     Patient Active Problem List  Diagnosis   Cervical disc disease   Thoracic spondylosis   DDD (degenerative disc disease), lumbar   Lung nodule, multiple   B12 deficiency   Right thyroid  nodule   Medicare annual wellness visit, initial   COPD (chronic obstructive pulmonary disease) (CMS/HHS-HCC)   Nephrolithiasis   Chronic groin pain   Infrarenal abdominal aortic aneurysm (AAA) without rupture ()    Past Surgical History:  Procedure Laterality Date   COLONOSCOPY  2011   [place], [place], with two polyps retrieved   COLONOSCOPY  05/25/2018   Hyperplastic colon polyp/PHx CP/Repeat 104yrs/TKT   OTHER SURGERY Left 10/27/2022   Hip tear repair per patient.   FRACTURE SURGERY     HERNIA REPAIR     POSTERIOR LAMINECTOMY / DECOMPRESSION CERVICAL SPINE     right inguinal hernia     TONSILLECTOMY       Allergies  Allergen Reactions   Codeine Nausea and Nausea And Vomiting   Ketorolac Tromethamine Nausea And Vomiting   Ketorolac Nausea and Vomiting    Other reaction(s): gastric irritation    Current Outpatient Medications on File Prior to Visit  Medication Sig Dispense Refill   aspirin  81 MG EC tablet Take 81 mg by mouth once daily     clotrimazole  (MYCELEX ) 10 mg troche Take 1 tablet (10 mg total) by mouth 5 (five) times  daily Dissolve tablet(troche) slowly and completely in mouth. 50 Troche 1   cyanocobalamin  (VITAMIN B12) 1,000 mcg/mL injection INJECT 1 ML (1,000 MCG TOTAL) INTO THE MUSCLE MONTHLY 12 mL 1   finasteride  (PROSCAR ) 5 mg tablet Take 1 tablet (5 mg total) by mouth once daily 90 tablet 3   gabapentin  (NEURONTIN ) 300 MG capsule TAKE 1 CAPSULE BY MOUTH THREE TIMES A DAY 90 capsule 3   oxyCODONE  (ROXICODONE ) 5 MG immediate release tablet Take 1 tablet (5 mg total) by mouth every 6 (six) hours as needed for Pain 25 tablet 0   oxyCODONE  (ROXICODONE ) 5 MG immediate release tablet Take 1 tablet (5 mg total) by mouth every 6 (six)  hours as needed for Pain 25 tablet 0   oxyCODONE  (ROXICODONE ) 5 MG immediate release tablet Take 1 tablet (5 mg total) by mouth every 6 (six) hours as needed for Pain 25 tablet 0   oxyCODONE  (ROXICODONE ) 5 MG immediate release tablet Take 1 tablet (5 mg total) by mouth every 6 (six) hours as needed for Pain 25 tablet 0   oxyCODONE  (ROXICODONE ) 5 MG immediate release tablet Take 1 tablet (5 mg total) by mouth every 4 (four) hours as needed for Pain 20 tablet 0   oxyCODONE -acetaminophen  (PERCOCET) 5-325 mg tablet Take 1 tablet by mouth every 6 (six) hours as needed for Pain 25 tablet 0   pantoprazole  (PROTONIX ) 40 MG DR tablet Take 1 tablet (40 mg total) by mouth once daily 90 tablet 3   promethazine  (PHENERGAN ) 25 MG tablet Take 1 tablet (25 mg total) by mouth every 6 (six) hours as needed for Nausea 30 tablet 0   tamsulosin  (FLOMAX ) 0.4 mg capsule Take 1 capsule (0.4 mg total) by mouth once daily 30 MINUTES AFTER SAME MEAL EACH DAY 90 capsule 3   No current facility-administered medications on file prior to visit.    Family History  Problem Relation Age of Onset   High blood pressure (Hypertension) Mother    Stroke Mother    Colon cancer Father    Deep vein thrombosis (DVT or abnormal blood clot formation) Daughter    Diabetes type II Daughter    Osteoarthritis Daughter    Osteoporosis (Thinning of bones) Daughter    Colon polyps Son      Social History   Tobacco Use  Smoking Status Every Day   Current packs/day: 0.00   Average packs/day: 0.5 packs/day for 40.0 years (20.0 ttl pk-yrs)   Types: Cigarettes   Start date: 05/13/1978   Last attempt to quit: 05/13/2018   Years since quitting: 6.5  Smokeless Tobacco Former   Quit date: 05/13/2018     Social History   Socioeconomic History   Marital status: Married  Tobacco Use   Smoking status: Every Day    Current packs/day: 0.00    Average packs/day: 0.5 packs/day for 40.0 years (20.0 ttl pk-yrs)     Types: Cigarettes    Start date: 05/13/1978    Last attempt to quit: 05/13/2018    Years since quitting: 6.5   Smokeless tobacco: Former    Quit date: 05/13/2018  Vaping Use   Vaping status: Never Used  Substance and Sexual Activity   Alcohol use: Not Currently    Alcohol/week: 0.0 standard drinks of alcohol   Drug use: No   Sexual activity: Yes    Partners: Female  Social History Narrative   Disabled .   Social Drivers of Health   Financial Resource Strain: Low Risk  (01/19/2024)  Overall Financial Resource Strain (CARDIA)    Difficulty of Paying Living Expenses: Not hard at all  Food Insecurity: No Food Insecurity (09/03/2024)   Received from [facility]   Hunger Vital Sign    Within the past 12 months, you worried that your food would run out before you got the money to buy more.: Never true    Within the past 12 months, the food you bought just didn't last and you didn't have money to get more.: Never true  Transportation Needs: No Transportation Needs (09/03/2024)   Received from [facility] - Transportation    In the past 12 months, has lack of transportation kept you from medical appointments or from getting medications?: No    In the past 12 months, has lack of transportation kept you from meetings, work, or from getting things needed for daily living?: No  Housing Stability: Low Risk  (01/19/2024)   Housing Stability Vital Sign    Unable to Pay for Housing in the Last Year: No    Number of Times Moved in the Last Year: 0    Homeless in the Last Year: No    Objective:    Vitals:   11/26/24 0846  PainSc:   3    There is no height or weight on file to calculate BMI.  Physical Exam   He appears well on exam  There is a small hernia at the most lateral aspect of the incision toward the anterior superior iliac spine of the pelvis.  It is easily reducible.  He does have his chronic tenderness mildly along his incision and I did inject a mixture  of Marcaine  and lidocaine  in this area per his request.  He tolerated this well   Labs, Imaging and Diagnostic Testing:  I reviewed the notes in the electronic medical records   Assessment and Plan:     Diagnoses and all orders for this visit:  Recurrent right inguinal hernia  Other orders -     amoxicillin -clavulanate (AUGMENTIN ) 875-125 mg tablet; Take 1 tablet (875 mg total) by mouth every 12 (twelve) hours for 5 days -     methylPREDNISolone  (MEDROL  DOSEPACK) 4 mg tablet; Follow package directions.     At this point he does appear to have a recurrent hernia but only at this most lateral aspect and nothing along his inguinal incision.  At this point I would have one of our robotic surgeons evaluate him to see if he would be a candidate for a robotic repair and potentially a neurectomy because of his chronic discomfort versus me doing a simple open repair.  Again, and hesitant to put mesh in from an anterior approach given his pain issues he has had.  He is in agreement getting opinion from one of our robotic surgeons.  I reported him that I would still follow him postoperatively if he decided on a robotic repair versus an open repair.  He agrees with the plans.  Return in about 3 days (around 11/29/2024).   VICENTA DASIE POLI, MD     "

## 2024-12-18 ENCOUNTER — Encounter (HOSPITAL_COMMUNITY)

## 2024-12-24 ENCOUNTER — Ambulatory Visit (HOSPITAL_COMMUNITY): Admit: 2024-12-24 | Admitting: Surgery

## 2025-07-16 ENCOUNTER — Ambulatory Visit: Admitting: Urology
